# Patient Record
Sex: Female | Born: 1965 | Race: White | Hispanic: No | Smoking: Former smoker
Health system: Southern US, Community
[De-identification: ages and names within clinical notes are randomized; demographics above are authoritative.]

## PROBLEM LIST (undated history)

## (undated) DIAGNOSIS — E785 Hyperlipidemia, unspecified: Secondary | ICD-10-CM

## (undated) DIAGNOSIS — N63 Unspecified lump in unspecified breast: Secondary | ICD-10-CM

## (undated) DIAGNOSIS — I341 Nonrheumatic mitral (valve) prolapse: Secondary | ICD-10-CM

## (undated) DIAGNOSIS — F509 Eating disorder, unspecified: Secondary | ICD-10-CM

## (undated) DIAGNOSIS — I491 Atrial premature depolarization: Secondary | ICD-10-CM

## (undated) DIAGNOSIS — K219 Gastro-esophageal reflux disease without esophagitis: Secondary | ICD-10-CM

## (undated) DIAGNOSIS — M797 Fibromyalgia: Secondary | ICD-10-CM

## (undated) DIAGNOSIS — M1990 Unspecified osteoarthritis, unspecified site: Secondary | ICD-10-CM

## (undated) DIAGNOSIS — T7840XA Allergy, unspecified, initial encounter: Secondary | ICD-10-CM

## (undated) DIAGNOSIS — F4310 Post-traumatic stress disorder, unspecified: Secondary | ICD-10-CM

## (undated) DIAGNOSIS — J439 Emphysema, unspecified: Secondary | ICD-10-CM

## (undated) DIAGNOSIS — F32A Depression, unspecified: Secondary | ICD-10-CM

## (undated) DIAGNOSIS — N9489 Other specified conditions associated with female genital organs and menstrual cycle: Secondary | ICD-10-CM

## (undated) DIAGNOSIS — R011 Cardiac murmur, unspecified: Secondary | ICD-10-CM

## (undated) DIAGNOSIS — N83209 Unspecified ovarian cyst, unspecified side: Secondary | ICD-10-CM

## (undated) DIAGNOSIS — A64 Unspecified sexually transmitted disease: Secondary | ICD-10-CM

## (undated) DIAGNOSIS — N6019 Diffuse cystic mastopathy of unspecified breast: Secondary | ICD-10-CM

## (undated) DIAGNOSIS — R7611 Nonspecific reaction to tuberculin skin test without active tuberculosis: Secondary | ICD-10-CM

## (undated) DIAGNOSIS — N390 Urinary tract infection, site not specified: Secondary | ICD-10-CM

## (undated) DIAGNOSIS — R4189 Other symptoms and signs involving cognitive functions and awareness: Secondary | ICD-10-CM

## (undated) DIAGNOSIS — I493 Ventricular premature depolarization: Secondary | ICD-10-CM

## (undated) HISTORY — DX: Unspecified lump in unspecified breast: N63.0

## (undated) HISTORY — DX: Nonrheumatic mitral (valve) prolapse: I34.1

## (undated) HISTORY — PX: REPLANTATION THUMB: SUR1233

## (undated) HISTORY — DX: Other symptoms and signs involving cognitive functions and awareness: R41.89

## (undated) HISTORY — DX: Depression, unspecified: F32.A

## (undated) HISTORY — DX: Atrial premature depolarization: I49.1

## (undated) HISTORY — DX: Ventricular premature depolarization: I49.3

## (undated) HISTORY — DX: Gastro-esophageal reflux disease without esophagitis: K21.9

## (undated) HISTORY — DX: Urinary tract infection, site not specified: N39.0

## (undated) HISTORY — DX: Diffuse cystic mastopathy of unspecified breast: N60.19

## (undated) HISTORY — DX: Nonspecific reaction to tuberculin skin test without active tuberculosis: R76.11

## (undated) HISTORY — DX: Eating disorder, unspecified: F50.9

## (undated) HISTORY — DX: Unspecified osteoarthritis, unspecified site: M19.90

## (undated) HISTORY — DX: Hyperlipidemia, unspecified: E78.5

## (undated) HISTORY — PX: CERVICAL POLYPECTOMY: SHX88

## (undated) HISTORY — DX: Fibromyalgia: M79.7

## (undated) HISTORY — PX: SINUS LIFT WITH BONE GRAFT: SHX5306

## (undated) HISTORY — DX: Cardiac murmur, unspecified: R01.1

## (undated) HISTORY — DX: Unspecified ovarian cyst, unspecified side: N83.209

## (undated) HISTORY — DX: Post-traumatic stress disorder, unspecified: F43.10

## (undated) HISTORY — PX: TUBAL LIGATION: SHX77

## (undated) HISTORY — PX: MAXILLARY SINUS LIFT: SHX5206

## (undated) HISTORY — DX: Unspecified sexually transmitted disease: A64

## (undated) HISTORY — DX: Other specified conditions associated with female genital organs and menstrual cycle: N94.89

## (undated) HISTORY — DX: Allergy, unspecified, initial encounter: T78.40XA

## (undated) HISTORY — DX: Emphysema, unspecified: J43.9

---

## 2000-12-08 ENCOUNTER — Encounter: Payer: Self-pay | Admitting: Cardiology

## 2000-12-08 ENCOUNTER — Encounter: Admission: RE | Admit: 2000-12-08 | Discharge: 2000-12-08 | Payer: Self-pay | Admitting: Cardiology

## 2001-02-11 ENCOUNTER — Ambulatory Visit (HOSPITAL_COMMUNITY): Admission: RE | Admit: 2001-02-11 | Discharge: 2001-02-11 | Payer: Self-pay | Admitting: Cardiology

## 2001-02-11 ENCOUNTER — Encounter: Payer: Self-pay | Admitting: Cardiology

## 2003-11-25 ENCOUNTER — Encounter: Admission: RE | Admit: 2003-11-25 | Discharge: 2003-11-25 | Payer: Self-pay | Admitting: Family Medicine

## 2003-12-28 ENCOUNTER — Other Ambulatory Visit: Admission: RE | Admit: 2003-12-28 | Discharge: 2003-12-28 | Payer: Self-pay | Admitting: Family Medicine

## 2008-12-26 ENCOUNTER — Other Ambulatory Visit: Admission: RE | Admit: 2008-12-26 | Discharge: 2008-12-26 | Payer: Self-pay | Admitting: Obstetrics and Gynecology

## 2008-12-27 ENCOUNTER — Encounter: Admission: RE | Admit: 2008-12-27 | Discharge: 2008-12-27 | Payer: Self-pay | Admitting: Obstetrics and Gynecology

## 2009-03-14 ENCOUNTER — Encounter: Payer: Self-pay | Admitting: Cardiology

## 2009-06-30 ENCOUNTER — Emergency Department (HOSPITAL_COMMUNITY): Admission: EM | Admit: 2009-06-30 | Discharge: 2009-06-30 | Payer: Self-pay | Admitting: Emergency Medicine

## 2009-07-10 ENCOUNTER — Encounter: Admission: RE | Admit: 2009-07-10 | Discharge: 2009-07-10 | Payer: Self-pay | Admitting: Obstetrics and Gynecology

## 2009-10-04 ENCOUNTER — Encounter: Admission: RE | Admit: 2009-10-04 | Discharge: 2009-10-04 | Payer: Self-pay | Admitting: Obstetrics and Gynecology

## 2009-10-18 LAB — HM PAP SMEAR: HM Pap smear: NORMAL

## 2009-11-24 ENCOUNTER — Ambulatory Visit: Payer: Self-pay | Admitting: Family Medicine

## 2009-11-24 DIAGNOSIS — Z8679 Personal history of other diseases of the circulatory system: Secondary | ICD-10-CM | POA: Insufficient documentation

## 2009-11-24 DIAGNOSIS — R142 Eructation: Secondary | ICD-10-CM

## 2009-11-24 DIAGNOSIS — R002 Palpitations: Secondary | ICD-10-CM

## 2009-11-24 DIAGNOSIS — R5383 Other fatigue: Secondary | ICD-10-CM

## 2009-11-24 DIAGNOSIS — R141 Gas pain: Secondary | ICD-10-CM | POA: Insufficient documentation

## 2009-11-24 DIAGNOSIS — R5381 Other malaise: Secondary | ICD-10-CM

## 2009-11-24 DIAGNOSIS — J3089 Other allergic rhinitis: Secondary | ICD-10-CM | POA: Insufficient documentation

## 2009-11-24 DIAGNOSIS — M2550 Pain in unspecified joint: Secondary | ICD-10-CM | POA: Insufficient documentation

## 2009-11-24 DIAGNOSIS — F329 Major depressive disorder, single episode, unspecified: Secondary | ICD-10-CM

## 2009-11-24 DIAGNOSIS — J309 Allergic rhinitis, unspecified: Secondary | ICD-10-CM

## 2009-11-24 DIAGNOSIS — R143 Flatulence: Secondary | ICD-10-CM

## 2009-11-24 DIAGNOSIS — N83209 Unspecified ovarian cyst, unspecified side: Secondary | ICD-10-CM

## 2009-11-28 ENCOUNTER — Encounter (INDEPENDENT_AMBULATORY_CARE_PROVIDER_SITE_OTHER): Payer: Self-pay | Admitting: *Deleted

## 2009-11-29 ENCOUNTER — Encounter (INDEPENDENT_AMBULATORY_CARE_PROVIDER_SITE_OTHER): Payer: Self-pay | Admitting: *Deleted

## 2009-12-11 ENCOUNTER — Telehealth (INDEPENDENT_AMBULATORY_CARE_PROVIDER_SITE_OTHER): Payer: Self-pay | Admitting: *Deleted

## 2009-12-19 ENCOUNTER — Encounter (INDEPENDENT_AMBULATORY_CARE_PROVIDER_SITE_OTHER): Payer: Self-pay | Admitting: *Deleted

## 2010-06-19 NOTE — Miscellaneous (Signed)
  Clinical Lists Changes  Observations: Added new observation of PAST SURG HX: Tubal ligation Cervical Polyp removal (11/28/2009 11:24) Added new observation of PAST MED HX: Fibrocystic Breast Ovarian Cyst Allergic rhinitis Mitral Valve Prolapse Hx of Positive PPD UTI  (11/28/2009 11:24) Added new observation of MAMMO DUE: 01/2010 (11/28/2009 11:24) Added new observation of MAMMOGRAM: normal (10/11/2009 11:25)      Preventive Care Screening  Mammogram:    Date:  10/11/2009    Next Due:  01/2010    Results:  normal    Past Medical History:    Fibrocystic Breast    Ovarian Cyst    Allergic rhinitis    Mitral Valve Prolapse    Hx of Positive PPD    UTI  Past Surgical History:    Tubal ligation    Cervical Polyp removal

## 2010-06-19 NOTE — Letter (Signed)
Summary: [place] No Show Letter  [place] [facility] [place]  [address] [place], [place] [address]   Phone: [phone]  Fax: [phone]    12/19/2009 MRN: [id number]  [facility] [address] [place], [place]  [address]   Dear Ms. Stuckert,   Our records indicate that you missed your scheduled appointment with _________Dr.Tabori________ on ______8/1/11______.  Please contact this office to reschedule your appointment as soon as possible.  It is important that you keep your scheduled appointments with your physician, so we can provide you the best care possible.  Please be advised that there may be a charge for "no show" appointments.    Sincerely,   [place] [facility]
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
## 2010-06-19 NOTE — Progress Notes (Signed)
Summary: RESULTS OF LABS???  Phone Note Call from Patient Call back at [facility] Phone [phone]   Caller: Patient Summary of Call: PATIENT CALLED TO ASK ABOUT RESULTS OF HER LAB WORK----IT HAS BEEN A LONG TIME!!!! Initial call taken by: Jerolyn Shin,  December 11, 2009 12:28 PM  Follow-up for Phone Call        tried calling patient recording stating all circuits are busy will try back ..........Marland KitchenDoristine Devoid CMA  December 11, 2009 4:22 PM   found another # for patient [phone] left message on machine ..........Marland KitchenDoristine Devoid CMA  December 12, 2009 9:21 AM   Additional Follow-up for Phone Call Additional follow up Details #1::        Gave her friend Elijah Birk information on nomal lab work and then spoke with patient  and she is aware.  Additional Follow-up by: Harold Barban,  December 12, 2009 9:50 AM

## 2010-06-19 NOTE — Letter (Signed)
Summary: New Patient letter  [facility]  [address] [place], [place] [address]   Phone: [phone]  Fax: [phone]       11/29/2009 MRN: [id number]  [facility] [address] [place], [place]  [address]  Dear Katherine Cruz,  Welcome to the Gastroenterology Division at [facility].    You are scheduled to see Dr. Christella Hartigan on 01/16/2010 at 8:30AM on the 3rd floor at [facility], [address]  We ask that you try to arrive at our office 15 minutes prior to your appointment time to allow for check-in.  We would like you to complete the enclosed self-administered evaluation form prior to your visit and bring it with you on the day of your appointment.  We will review it with you.  Also, please bring a complete list of all your medications or, if you prefer, bring the medication bottles and we will list them.  Please bring your insurance card so that we may make a copy of it.  If your insurance requires a referral to see a specialist, please bring your referral form from your primary care physician.  Co-payments are due at the time of your visit and may be paid by cash, check or credit card.     Your office visit will consist of a consult with your physician (includes a physical exam), any laboratory testing he/she may order, scheduling of any necessary diagnostic testing (e.g. x-ray, ultrasound, CT-scan), and scheduling of a procedure (e.g. Endoscopy, Colonoscopy) if required.  Please allow enough time on your schedule to allow for any/all of these possibilities.    If you cannot keep your appointment, please call [phone] to cancel or reschedule prior to your appointment date.  This allows [place] the opportunity to schedule an appointment for another patient in need of care.  If you do not cancel or reschedule by 5 p.m. the business day prior to your appointment date, you will be charged a $50.00 late cancellation/no-show fee.    Thank you for choosing  [facility] for your medical needs.  We appreciate the opportunity to care for you.  Please visit [place] at our website  to learn more about our practice.                     Sincerely,                                                             The Gastroenterology Division

## 2010-08-19 LAB — HM MAMMOGRAPHY: HM Mammogram: NORMAL

## 2010-08-22 ENCOUNTER — Encounter: Payer: Self-pay | Admitting: Family Medicine

## 2010-08-23 ENCOUNTER — Other Ambulatory Visit: Payer: Self-pay | Admitting: Family Medicine

## 2010-08-23 ENCOUNTER — Ambulatory Visit (INDEPENDENT_AMBULATORY_CARE_PROVIDER_SITE_OTHER): Payer: Self-pay | Admitting: Family Medicine

## 2010-08-23 VITALS — BP 110/62 | HR 76 | Temp 98.3°F | Wt 147.0 lb

## 2010-08-23 DIAGNOSIS — N63 Unspecified lump in unspecified breast: Secondary | ICD-10-CM

## 2010-08-23 HISTORY — DX: Unspecified lump in unspecified breast: N63.0

## 2010-08-23 MED ORDER — SULFAMETHOXAZOLE-TRIMETHOPRIM 800-160 MG PO TABS: 1.0000 | ORAL_TABLET | Freq: Two times a day (BID) | ORAL | Status: AC

## 2010-08-23 NOTE — Patient Instructions (Signed)
We will call you with your mammogram appt Start the Bactrim twice daily as directed for possible breast infection Apply hot compresses for pain relief Call and see if you apply for the [facility] discount program OR call [facility] at [phone] so that your additional issues can be addressed Call with any questions or concerns Hang in there!

## 2010-08-23 NOTE — Progress Notes (Signed)
  Subjective:    Patient ID: Katherine Cruz, female    DOB: January 31, 1966, 45 y.o.   MRN: [id number]  HPI L breast pain- found lump in breast 10 days ago, 2 days ago it was so painful she was unable to hug.  No drainage, no redness.  + warmth.  Pain is located around the areola and nipple.  Hx of fibrocystic breasts.  Last mammogram was 09/2009.   Review of Systems For ROS see HPI     Objective:   Physical Exam  Pulmonary/Chest: Right breast exhibits no inverted nipple, no mass, no nipple discharge, no skin change and no tenderness. Left breast exhibits mass and tenderness. Left breast exhibits no inverted nipple and no nipple discharge.            Assessment & Plan:

## 2010-08-27 ENCOUNTER — Ambulatory Visit
Admission: RE | Admit: 2010-08-27 | Discharge: 2010-08-27 | Disposition: A | Discharge status: Home / Self Care | Payer: Self-pay | Source: Ambulatory Visit | Attending: Family Medicine | Admitting: Family Medicine

## 2010-08-27 ENCOUNTER — Other Ambulatory Visit: Payer: Self-pay | Admitting: Family Medicine

## 2010-08-27 DIAGNOSIS — N63 Unspecified lump in unspecified breast: Secondary | ICD-10-CM

## 2010-09-04 NOTE — Assessment & Plan Note (Signed)
Get diagnostic mammo to r/o cancer.  tx as mastitis/abscess given erythema and warmth.  Pt expressed understanding and is in agreement w/ plan.

## 2010-09-21 ENCOUNTER — Telehealth: Payer: Self-pay | Admitting: *Deleted

## 2010-09-21 ENCOUNTER — Emergency Department (HOSPITAL_COMMUNITY)
Admission: EM | Admit: 2010-09-21 | Discharge: 2010-09-21 | Disposition: A | Discharge status: Home / Self Care | Payer: Self-pay | Attending: Emergency Medicine | Admitting: Emergency Medicine

## 2010-09-21 DIAGNOSIS — R0602 Shortness of breath: Secondary | ICD-10-CM | POA: Insufficient documentation

## 2010-09-21 DIAGNOSIS — I059 Rheumatic mitral valve disease, unspecified: Secondary | ICD-10-CM | POA: Insufficient documentation

## 2010-09-21 DIAGNOSIS — G4730 Sleep apnea, unspecified: Secondary | ICD-10-CM | POA: Insufficient documentation

## 2010-09-21 LAB — POCT I-STAT, CHEM 8
BUN: 8 mg/dL (ref 6–23)
Calcium, Ion: 1.12 mmol/L (ref 1.12–1.32)
Creatinine, Ser: 0.7 mg/dL (ref 0.4–1.2)
TCO2: 25 mmol/L (ref 0–100)

## 2010-09-21 NOTE — Telephone Encounter (Signed)
[facility] Triage Call Report Triage Record Num: [id number] Operator: Alphonsa Overall Patient Name: Katherine Cruz Call Date & Time: 09/21/2010 1:20:56AM Patient Phone: [phone] PCP: Neena Rhymes ([facility]) Patient Gender: Female PCP Fax : Patient DOB: 05-22-1965 Practice Name: [facility] Reason for Call: LMP 08/22/10. Denies preg or BF. Maizy calling about apnea. Onset 09/21/10 0030. Pt stopped breathing during sleep and she jumped up and breathing heavy. Lightheaded at 0124. 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 Pt has stopped smoking and drinking caffiene abruptly. Pt with SOB, heart palpatations during day. Therasa aware needs ED care now. Protocol(s) Used: Breathing Problems Recommended Outcome per Protocol: See ED Immediately Reason for Outcome: Sudden onset of shortness of breath, difficulty breathing, chest pain OR cough with blood tinged sputum. Care Advice: ~ Another adult should drive. Call EMS 911 if loss of consciousness, struggling to breathe, experiences new confusion or extreme drowsiness, change in skin color, or has chest pain or discomfort lasting 5 minutes or more. ~ ~ IMMEDIATE ACTION Write down provider's name. List or place the following in a bag for transport with the patient: current prescription and/or nonprescription medications; alternative treatments, therapies and medications; and street drugs. ~ ~ Place person in a position of comfort and loosen tight clothing.

## 2010-09-21 NOTE — Telephone Encounter (Signed)
Pt evaluated in ED and discharge home this AM.

## 2010-09-24 ENCOUNTER — Telehealth: Payer: Self-pay | Admitting: Family Medicine

## 2010-09-24 NOTE — Telephone Encounter (Signed)
Patient has lab appt for Tues 5/8 at 9am---doesn't have insurance right now--can she put lab off for 2-3 weeks unless [employer] program kicks in--worried since she was in hospital that she shouldn't put it off, but would like to if it wont hurt her health

## 2010-09-24 NOTE — Telephone Encounter (Signed)
Please advise 

## 2010-09-24 NOTE — Telephone Encounter (Signed)
Called [phone] per patient's request---gave message to female that answered phone that it was OK to move appt out 2-3 weeks---he said her phone was not working and that's why she left his phone number instead

## 2010-09-24 NOTE — Telephone Encounter (Signed)
Ok to hold off for 2-3 weeks

## 2010-09-25 ENCOUNTER — Other Ambulatory Visit: Payer: Self-pay

## 2010-09-28 NOTE — Telephone Encounter (Signed)
noted 

## 2010-10-10 ENCOUNTER — Ambulatory Visit (INDEPENDENT_AMBULATORY_CARE_PROVIDER_SITE_OTHER): Payer: Self-pay | Admitting: Family Medicine

## 2010-10-10 ENCOUNTER — Encounter: Payer: Self-pay | Admitting: Family Medicine

## 2010-10-10 DIAGNOSIS — R6889 Other general symptoms and signs: Secondary | ICD-10-CM

## 2010-10-10 DIAGNOSIS — G4730 Sleep apnea, unspecified: Secondary | ICD-10-CM

## 2010-10-10 DIAGNOSIS — R7989 Other specified abnormal findings of blood chemistry: Secondary | ICD-10-CM

## 2010-10-10 DIAGNOSIS — J309 Allergic rhinitis, unspecified: Secondary | ICD-10-CM

## 2010-10-10 NOTE — Patient Instructions (Signed)
We will notify you of your lab results Someone will call you with your pulmonary appt to evaluate for sleep apnea Start OTC Claritin or Zyrtec daily for your allergies and post nasal drip Call with any questions or concerns Hang in there!

## 2010-10-10 NOTE — Progress Notes (Signed)
  Subjective:    Patient ID: Katherine Cruz, female    DOB: 1966-04-10, 45 y.o.   MRN: [id number]  HPI ? Sleep apnea- went to hospital 5/4 for SOB overnight.  (records reviewed)  ER doc thought sxs were consistent w/ sleep apnea and recommended she f/u w/ PCP to get referral.  Abnormal TSH- pt's TSH was mildly elevated in ER.  Has not had T3/T4 evaluated.  Reports 'exhaustion' and multiple other sxs- weight gain, GI upset, myalgias and after doing internet research feels these are all thyroid related.  Sore throat- chronic problem for pt 'months and months', reports white patches consistently.  Felt sxs improved when on abx for breast cyst.  Stopped smoking.  Fears cancer.  Hx of PND and seasonal allergies.  Not currently taking any allergy meds.   Review of Systems For ROS see HPI     Objective:   Physical Exam  Constitutional: She appears well-developed and well-nourished. No distress.       anxious  HENT:  Head: Normocephalic and atraumatic.  Right Ear: Tympanic membrane, external ear and ear canal normal.  Left Ear: Tympanic membrane, external ear and ear canal normal.  Nose: Mucosal edema and rhinorrhea present. Right sinus exhibits no maxillary sinus tenderness and no frontal sinus tenderness. Left sinus exhibits no maxillary sinus tenderness and no frontal sinus tenderness.  Mouth/Throat: Uvula is midline, oropharynx is clear and moist and mucous membranes are normal. No oral lesions. No uvula swelling. No posterior oropharyngeal edema or tonsillar abscesses.       + PND  Neck: Normal range of motion. Neck supple. Thyromegaly present.       Mildly enlarged thyroid w/out nodules or assymmetry  Cardiovascular: Normal rate, regular rhythm, normal heart sounds and intact distal pulses.   No murmur heard. Pulmonary/Chest: Effort normal and breath sounds normal. No respiratory distress. She has no wheezes.  Abdominal: Soft. Bowel sounds are normal. She exhibits no distension. There is no  tenderness.  Musculoskeletal: She exhibits no edema.          Assessment & Plan:

## 2010-10-10 NOTE — Assessment & Plan Note (Signed)
Pt's sore throat and white patches are most likely due to untreated PND.  Nothing on PE concerning for infxn or cancer.  Start OTC antihistamines, ibuprofen prn for pain.  Reviewed supportive care and red flags that should prompt return.  Pt expressed understanding and is in agreement w/ plan.

## 2010-10-10 NOTE — Assessment & Plan Note (Signed)
Pt's body habitus not consistent w/ sleep apnea but it is possible.  will refer to pulm at [facility] recommendation.

## 2010-10-10 NOTE — Assessment & Plan Note (Signed)
Repeat TSH and check T3/T4.  Start meds prn.  Pt is very hopeful that this will explain the majority of her ongoing sxs.

## 2010-10-11 ENCOUNTER — Encounter: Payer: Self-pay | Admitting: *Deleted

## 2010-10-11 ENCOUNTER — Telehealth: Payer: Self-pay | Admitting: *Deleted

## 2010-10-11 NOTE — Telephone Encounter (Signed)
Tried to call Pt VM not set up will try again later

## 2010-10-11 NOTE — Telephone Encounter (Addendum)
Tried to call Patient no answer and VM not set up will try again later.  Message copied by Candie Echevaria on Thu Oct 11, 2010  8:52 AM ------      Message from: Neena Rhymes      Created: Wed Oct 10, 2010  4:53 PM       Thyroid is normal.  T3 is at low end of normal.  Could start levothyroxine daily and see if sxs improve.  Will repeat labs in 3 months.

## 2010-10-17 ENCOUNTER — Encounter: Payer: Self-pay | Admitting: *Deleted

## 2010-10-17 ENCOUNTER — Telehealth: Payer: Self-pay | Admitting: Family Medicine

## 2010-10-17 MED ORDER — LEVOTHYROXINE SODIUM 50 MCG PO CAPS: 50.0000 ug | ORAL_CAPSULE | Freq: Every day | ORAL | Status: DC

## 2010-10-17 NOTE — Telephone Encounter (Signed)
Discuss with patient, Rx sent to pharmacy. 

## 2010-10-17 NOTE — Telephone Encounter (Signed)
Tried to call Pt still no answer and VM not set up. Letter mailed to Pt advising Pt to contact office.Marland Kitchen

## 2010-10-17 NOTE — Telephone Encounter (Signed)
Error---ignore=duplicate

## 2010-10-17 NOTE — Telephone Encounter (Signed)
Addended by: Candie Echevaria L on: 10/17/2010 04:23 PM   Modules accepted: Orders

## 2010-10-18 ENCOUNTER — Telehealth: Payer: Self-pay | Admitting: Endocrinology

## 2010-10-18 NOTE — Telephone Encounter (Signed)
Katherine Cruz called asking to see [name] as a second opinion regarding starting thyroid medicine which [name] has offered as an option.    Patient wants to see [name] to discuss alternative options.  She has questions about taking iodine instead of hormones. I have called her back to tell her [name] supports [name] suggestion about starting the Levothyroxine.  She still would like to have an appt. With [name].

## 2010-10-18 NOTE — Telephone Encounter (Signed)
ok 

## 2010-10-19 NOTE — Telephone Encounter (Signed)
Pt is scheduled with [name] on June 25.  She has also made an appt to transfer to Dr. Yetta Barre on July 13.

## 2010-10-24 ENCOUNTER — Encounter: Payer: Self-pay | Admitting: Pulmonary Disease

## 2010-10-24 ENCOUNTER — Ambulatory Visit (INDEPENDENT_AMBULATORY_CARE_PROVIDER_SITE_OTHER): Payer: Self-pay | Admitting: Pulmonary Disease

## 2010-10-24 VITALS — BP 124/72 | HR 84 | Temp 98.2°F | Ht 64.5 in | Wt 144.8 lb

## 2010-10-24 DIAGNOSIS — G4733 Obstructive sleep apnea (adult) (pediatric): Secondary | ICD-10-CM

## 2010-10-24 DIAGNOSIS — G478 Other sleep disorders: Secondary | ICD-10-CM | POA: Insufficient documentation

## 2010-10-24 NOTE — Progress Notes (Signed)
  Subjective:    Patient ID: Katherine Cruz, female    DOB: Aug 11, 1965, 45 y.o.   MRN: [id number]  HPI The pt is a 45y/o female who I have been asked to see for possible osa.  She has been having issues with gasping arousals during sleep for about 3 yrs, but now the problem is becoming more consistent.  Currently occurs about 2 times a week, and up to 5 times each night when does occur.  She awakens from sleep with a feeling she "can't breathe", but denies air restriction in her throat or chest.  She has no h/o asthma and denies reflux symptoms, but does have a h/o MVP.  She has been noted to have mild/mod snoring, but bed partner has not witnessed an abnormal breathing pattern during sleep.  She is not rested upon arising, and does note sleep pressure issues during the day with inactivity.  She is not satisfied with her alertness during the day.  She has no dozing issues in the evening watching tv, but does have sleepiness.  She denies any sleepiness with driving.  Of note, her weight is up 10-15 pounds over the last 2 yrs, and her epworth today is 6.   Sleep Questionnaire: What time do you typically go to bed?( Between what hours) 11 to midnight How long does it take you to fall asleep? 30 mins How many times during the night do you wake up? 5 What time do you get out of bed to start your day? 0800 Do you drive or operate heavy machinery in your occupation? No How much has your weight changed (up or down) over the past two years? (In pounds) 15 lb (6.804 kg) Have you ever had a sleep study before? Do you currently use CPAP? No Do you wear oxygen at any time? No    Review of Systems  Constitutional: Positive for unexpected weight change. Negative for fever.  HENT: Positive for ear pain, congestion, sore throat, sneezing and dental problem. Negative for nosebleeds, rhinorrhea, trouble swallowing, postnasal drip and sinus pressure.   Eyes: Negative for redness and itching.  Respiratory: Positive for  shortness of breath. Negative for cough, chest tightness and wheezing.   Cardiovascular: Positive for chest pain and palpitations. Negative for leg swelling.  Gastrointestinal: Positive for abdominal pain. Negative for nausea and vomiting.  Genitourinary: Negative for dysuria.  Musculoskeletal: Positive for joint swelling.  Skin: Negative for rash.  Neurological: Negative for headaches.  Hematological: Does not bruise/bleed easily.  Psychiatric/Behavioral: Negative for dysphoric mood. The patient is nervous/anxious.        Objective:   Physical Exam Constitutional:  Well developed, no acute distress  HENT:  Nares patent without discharge  Oropharynx without exudate, palate and uvula are normal  Eyes:  Perrla, eomi, no scleral icterus  Neck:  No JVD, appears to have mildly enlarged thyroid.  Cardiovascular:  Normal rate, regular rhythm, no rubs or gallops.  No murmurs        Intact distal pulses  Pulmonary :  Normal breath sounds, no stridor or respiratory distress   No rales, rhonchi, or wheezing  Abdominal:  Soft, nondistended, bowel sounds present.  No tenderness noted.   Musculoskeletal:  No lower extremity edema noted.  Lymph Nodes:  No cervical lymphadenopathy noted  Skin:  No cyanosis noted  Neurologic:  Alert, appropriate, moves all 4 extremities without obvious deficit.         Assessment & Plan:

## 2010-10-24 NOTE — Assessment & Plan Note (Signed)
There are many features of her history that are suggestive of sleep apnea, but she does not have the body habitus or upper airway anatomy that would put her at risk for osa.  I think she does need a sleep study to put the issue to rest, but would need to consider the other possibilities for her symptoms if the study is unremarkable (although her symptoms are intermittent).  This can include nocturnal asthma, GERD, panic attacks, and arrhythmias.

## 2010-10-24 NOTE — Patient Instructions (Signed)
Will do home sleep testing to evaluate for obstructive and central sleep apnea.  Will call you with results.

## 2010-10-26 ENCOUNTER — Encounter: Payer: Self-pay | Admitting: *Deleted

## 2010-10-26 NOTE — Telephone Encounter (Signed)
A user error has taken place: encounter opened in error, closed for administrative reasons.

## 2010-11-05 ENCOUNTER — Telehealth: Payer: Self-pay | Admitting: Pulmonary Disease

## 2010-11-05 NOTE — Telephone Encounter (Signed)
PCC, pls help with this thanks!

## 2010-11-05 NOTE — Telephone Encounter (Signed)
Called and spoke with pt and advised pt that there are still 3 people ahead of her on the wait list. Advised pt that it should be sometime this week when I can arrange this study for her. Pt stated that this was fine that she was just checking on status. Pt advised that we should be able to arrange this week and that I would contact her when device is available.

## 2010-11-12 ENCOUNTER — Ambulatory Visit (INDEPENDENT_AMBULATORY_CARE_PROVIDER_SITE_OTHER): Payer: Self-pay | Admitting: Endocrinology

## 2010-11-12 ENCOUNTER — Encounter: Payer: Self-pay | Admitting: Endocrinology

## 2010-11-12 VITALS — BP 110/72 | HR 73 | Temp 98.4°F | Ht 64.0 in | Wt 143.0 lb

## 2010-11-12 DIAGNOSIS — R002 Palpitations: Secondary | ICD-10-CM

## 2010-11-12 DIAGNOSIS — R6889 Other general symptoms and signs: Secondary | ICD-10-CM

## 2010-11-12 DIAGNOSIS — R7989 Other specified abnormal findings of blood chemistry: Secondary | ICD-10-CM

## 2010-11-12 NOTE — Patient Instructions (Addendum)
A 24-hr urine test is being ordered for you today.  please call [phone] to hear your test results.  You will be prompted to enter the 9-digit "MRN" number that appears at the top left of this page, followed by #.  Then you will hear the message. Please see dr Yetta Barre as scheduled.   No treatment is needed for the thyroid now, but you should have the thyroid blood test checked each year.  Your thyroid will probably go low, as you get older.
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
## 2010-11-15 ENCOUNTER — Other Ambulatory Visit: Payer: Self-pay

## 2010-11-15 DIAGNOSIS — R002 Palpitations: Secondary | ICD-10-CM

## 2010-11-19 LAB — CATECHOLAMINES, FRACTIONATED, URINE, 24 HOUR
Epinephrine, 24 hr Urine: 7 mcg/24 h (ref 2–24)
Norepinephrine, 24 hr Ur: 25 mcg/24 h (ref 15–100)
Total Volume - CF 24Hr U: 1000 mL

## 2010-11-26 ENCOUNTER — Ambulatory Visit (INDEPENDENT_AMBULATORY_CARE_PROVIDER_SITE_OTHER): Payer: Self-pay | Admitting: Pulmonary Disease

## 2010-11-26 DIAGNOSIS — G478 Other sleep disorders: Secondary | ICD-10-CM

## 2010-11-26 DIAGNOSIS — G4733 Obstructive sleep apnea (adult) (pediatric): Secondary | ICD-10-CM

## 2010-11-26 NOTE — Assessment & Plan Note (Signed)
The pt does not have sleep disordered breathing by her most recent sleep study, but her symptoms have been intermittent in the past.  I wonder if she may be having reflux which results in her gasping arousals?  Will discuss results with pt, and consider trying a course of PPI to see if helps.

## 2010-11-26 NOTE — Progress Notes (Signed)
The pt underwent home sleep testing with a type 3 monitoring device.  Airflow, effort, oxygen saturation, and heart rate were all monitored during the study.  Raw data and tracing were reviewed with the following findings:  1) flow evaluation period of 4hrs and  2) one apnea and no hypopneas were noted during the night, but snoring events were present.  AHI was essentially zero. 3) low oxygen saturation of 82%.  The pt spent only less than or equal to 88% during the night.

## 2010-11-27 ENCOUNTER — Telehealth: Payer: Self-pay | Admitting: Pulmonary Disease

## 2010-11-27 NOTE — Telephone Encounter (Signed)
Discussed sleep study with pt.  She had AHI 0. Suspect her symptoms are from reflux, MVP, doubt nocturnal asthma. Asked her to try prilosec 20mg  2 at [facility] for next few weeks as a trial.

## 2010-11-29 ENCOUNTER — Encounter: Payer: Self-pay | Admitting: Internal Medicine

## 2010-11-30 ENCOUNTER — Ambulatory Visit (INDEPENDENT_AMBULATORY_CARE_PROVIDER_SITE_OTHER): Payer: Self-pay | Admitting: Internal Medicine

## 2010-11-30 ENCOUNTER — Encounter: Payer: Self-pay | Admitting: Internal Medicine

## 2010-11-30 ENCOUNTER — Other Ambulatory Visit (INDEPENDENT_AMBULATORY_CARE_PROVIDER_SITE_OTHER): Payer: Self-pay

## 2010-11-30 DIAGNOSIS — R141 Gas pain: Secondary | ICD-10-CM

## 2010-11-30 DIAGNOSIS — R7989 Other specified abnormal findings of blood chemistry: Secondary | ICD-10-CM

## 2010-11-30 DIAGNOSIS — R6889 Other general symptoms and signs: Secondary | ICD-10-CM

## 2010-11-30 DIAGNOSIS — M2550 Pain in unspecified joint: Secondary | ICD-10-CM

## 2010-11-30 DIAGNOSIS — G478 Other sleep disorders: Secondary | ICD-10-CM

## 2010-11-30 DIAGNOSIS — R143 Flatulence: Secondary | ICD-10-CM

## 2010-11-30 DIAGNOSIS — R002 Palpitations: Secondary | ICD-10-CM

## 2010-11-30 DIAGNOSIS — IMO0001 Reserved for inherently not codable concepts without codable children: Secondary | ICD-10-CM | POA: Insufficient documentation

## 2010-11-30 DIAGNOSIS — R5381 Other malaise: Secondary | ICD-10-CM

## 2010-11-30 DIAGNOSIS — R5383 Other fatigue: Secondary | ICD-10-CM

## 2010-11-30 LAB — COMPREHENSIVE METABOLIC PANEL
ALT: 20 U/L (ref 0–35)
AST: 18 U/L (ref 0–37)
Albumin: 4.4 g/dL (ref 3.5–5.2)
Alkaline Phosphatase: 44 U/L (ref 39–117)
Glucose, Bld: 97 mg/dL (ref 70–99)
Potassium: 3.9 mEq/L (ref 3.5–5.1)
Sodium: 137 mEq/L (ref 135–145)
Total Protein: 7 g/dL (ref 6.0–8.3)

## 2010-11-30 LAB — SEDIMENTATION RATE: Sed Rate: 8 mm/h (ref 0–22)

## 2010-11-30 LAB — HIGH SENSITIVITY CRP: CRP, High Sensitivity: 1.77 mg/L (ref 0.000–5.000)

## 2010-11-30 LAB — RHEUMATOID FACTOR: Rheumatoid fact SerPl-aCnc: <10 [IU]/mL

## 2010-11-30 LAB — TSH: TSH: 1.63 u[IU]/mL (ref 0.35–5.50)

## 2010-11-30 MED ORDER — AMITRIPTYLINE HCL 10 MG PO TABS
10.0000 mg | ORAL_TABLET | Freq: Every day | ORAL | Status: DC
Start: 2010-11-30 — End: 2011-01-09

## 2010-11-30 NOTE — Assessment & Plan Note (Signed)
I agree with her that she has multiple s/s of FMG, I think the elavil will help, I will see how she responds to this and advise further

## 2010-11-30 NOTE — Assessment & Plan Note (Signed)
I ordered tests to screen for connective tissue disease

## 2010-11-30 NOTE — Assessment & Plan Note (Signed)
She tells me that this has resolved

## 2010-11-30 NOTE — Assessment & Plan Note (Signed)
I think this is the root of her symptoms so I have asked her to start taking a low dose of elavil at bedtime

## 2010-11-30 NOTE — Assessment & Plan Note (Signed)
As above.
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
## 2010-11-30 NOTE — Patient Instructions (Signed)

## 2010-11-30 NOTE — Assessment & Plan Note (Signed)
I will recheck her TSH today 

## 2010-12-03 ENCOUNTER — Encounter: Payer: Self-pay | Admitting: Pulmonary Disease

## 2010-12-04 ENCOUNTER — Ambulatory Visit: Payer: Self-pay | Attending: Otolaryngology | Admitting: Audiology

## 2010-12-04 DIAGNOSIS — H908 Mixed conductive and sensorineural hearing loss, unspecified: Secondary | ICD-10-CM | POA: Insufficient documentation

## 2010-12-21 ENCOUNTER — Telehealth: Payer: Self-pay | Admitting: *Deleted

## 2010-12-21 NOTE — Telephone Encounter (Signed)
normal

## 2010-12-21 NOTE — Telephone Encounter (Signed)
Patient requesting results of last labs.

## 2010-12-21 NOTE — Telephone Encounter (Signed)
Patient informed. 

## 2010-12-31 ENCOUNTER — Ambulatory Visit: Payer: Self-pay | Admitting: Internal Medicine

## 2010-12-31 DIAGNOSIS — Z0289 Encounter for other administrative examinations: Secondary | ICD-10-CM

## 2011-01-09 ENCOUNTER — Encounter: Payer: Self-pay | Admitting: Internal Medicine

## 2011-01-09 ENCOUNTER — Ambulatory Visit (INDEPENDENT_AMBULATORY_CARE_PROVIDER_SITE_OTHER): Payer: Self-pay | Admitting: Internal Medicine

## 2011-01-09 VITALS — BP 96/64 | HR 61 | Temp 98.0°F | Resp 16 | Wt 137.0 lb

## 2011-01-09 DIAGNOSIS — IMO0001 Reserved for inherently not codable concepts without codable children: Secondary | ICD-10-CM

## 2011-01-09 DIAGNOSIS — N83209 Unspecified ovarian cyst, unspecified side: Secondary | ICD-10-CM

## 2011-01-09 NOTE — Progress Notes (Signed)
Addendum - U/s orders needed correction, DONE

## 2011-01-09 NOTE — Assessment & Plan Note (Signed)
Repeat U/S has been ordered

## 2011-01-09 NOTE — Patient Instructions (Signed)

## 2011-01-09 NOTE — Progress Notes (Signed)
Addended by: Vernie Murders on: 01/09/2011 10:51 AM   Modules accepted: Orders

## 2011-01-09 NOTE — Assessment & Plan Note (Signed)
Pt ed material was given to her

## 2011-01-09 NOTE — Progress Notes (Signed)
Subjective:    Patient ID: Katherine Cruz, female    DOB: [birth year], 45 y.o.   MRN: [id number]  HPI She returns for f/up and she tells me that she did not start taking amitriptyline because [facility] would not pay for it but she tells me that her sleep has been "perfect" though she still has some intermittent diffuse muscle aches. 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 Her main complaint today is the left ovarian cyst that continues to bother her. She was seeing a GYN at [facility] but they will not see her anymore and she believes she needs a f/up U/S.    Review of Systems  Constitutional: Negative for fever, chills, diaphoresis, activity change, appetite change, fatigue and unexpected weight change.  HENT: Negative for sore throat, facial swelling, trouble swallowing, neck pain, neck stiffness and voice change.   Eyes: Negative.  Negative for photophobia, redness and visual disturbance.  Respiratory: Negative for apnea, cough, choking, chest tightness, shortness of breath, wheezing and stridor.   Cardiovascular: Negative for chest pain, palpitations and leg swelling.  Gastrointestinal: Negative for nausea, vomiting, abdominal pain, diarrhea, constipation and anal bleeding.  Genitourinary: Positive for pelvic pain (left). Negative for dysuria, urgency, frequency, hematuria, flank pain, decreased urine volume, vaginal bleeding, vaginal discharge, enuresis, difficulty urinating, genital sores, vaginal pain and dyspareunia.  Musculoskeletal: Negative for myalgias, back pain, joint swelling, arthralgias and gait problem.  Skin: Negative for color change, pallor, rash and wound.  Neurological: Negative for dizziness, tremors, seizures, syncope, facial asymmetry, speech difficulty, weakness, light-headedness, numbness and headaches.  Hematological: Negative for adenopathy. Does not bruise/bleed easily.  Psychiatric/Behavioral: Negative for suicidal ideas, hallucinations,  behavioral problems, confusion, sleep disturbance, self-injury, dysphoric mood, decreased concentration and agitation. The patient is not nervous/anxious and is not hyperactive.        Objective:   Physical Exam  Vitals reviewed. Constitutional: She is oriented to person, place, and time. She appears well-developed and well-nourished. No distress.  HENT:  Head: Normocephalic and atraumatic.  Right Ear: External ear normal.  Left Ear: External ear normal.  Nose: Nose normal.  Mouth/Throat: Oropharynx is clear and moist. No oropharyngeal exudate.  Eyes: Conjunctivae are normal. Pupils are equal, round, and reactive to light. Right eye exhibits no discharge. Left eye exhibits no discharge. No scleral icterus.  Neck: Normal range of motion. Neck supple. No JVD present. No tracheal deviation present. No thyromegaly present.  Cardiovascular: Normal rate, regular rhythm, normal heart sounds and intact distal pulses.  Exam reveals no gallop and no friction rub.   No murmur heard. Pulmonary/Chest: Effort normal and breath sounds normal. No stridor. No respiratory distress. She has no wheezes. She has no rales. She exhibits no tenderness.  Abdominal: Soft. Bowel sounds are normal. She exhibits no distension and no mass. There is no tenderness. There is no rebound and no guarding.  Musculoskeletal: Normal range of motion. She exhibits no edema and no tenderness.  Lymphadenopathy:    She has no cervical adenopathy.  Neurological: She is alert and oriented to person, place, and time. She has normal reflexes. She displays normal reflexes. No cranial nerve deficit. She exhibits normal muscle tone. Coordination normal.  Skin: Skin is warm and dry. No rash noted. She is not diaphoretic. No erythema. No pallor.  Psychiatric: She has a normal mood and affect. Her behavior is normal. Judgment and thought content normal.     Lab Results  Component Value Date  HGB 14.6 09/21/2010   HCT 43.0 09/21/2010   ALT  20 11/30/2010   AST 18 11/30/2010   NA 137 11/30/2010   K 3.9 11/30/2010   CL 103 11/30/2010   CREATININE 0.7 11/30/2010   BUN 20 11/30/2010   CO2 27 11/30/2010   TSH 1.63 11/30/2010      Assessment & Plan:

## 2011-01-16 NOTE — Progress Notes (Signed)
Addended by: Vernie Murders on: 01/16/2011 03:42 PM   Modules accepted: Orders

## 2011-01-18 ENCOUNTER — Ambulatory Visit (HOSPITAL_COMMUNITY)
Admission: RE | Admit: 2011-01-18 | Discharge: 2011-01-18 | Disposition: A | Discharge status: Home / Self Care | Payer: Self-pay | Source: Ambulatory Visit | Attending: Internal Medicine | Admitting: Internal Medicine

## 2011-01-18 DIAGNOSIS — N83209 Unspecified ovarian cyst, unspecified side: Secondary | ICD-10-CM | POA: Insufficient documentation

## 2011-01-18 DIAGNOSIS — N72 Inflammatory disease of cervix uteri: Secondary | ICD-10-CM | POA: Insufficient documentation

## 2011-01-28 ENCOUNTER — Telehealth: Payer: Self-pay | Admitting: *Deleted

## 2011-01-28 NOTE — Telephone Encounter (Signed)
Any advisement for her discomfort?

## 2011-01-28 NOTE — Telephone Encounter (Signed)
Patient informed. 

## 2011-01-28 NOTE — Telephone Encounter (Signed)
Aleve 1-2 BID

## 2011-01-28 NOTE — Telephone Encounter (Signed)
Ovarian cyst is complex but it appears to be benign

## 2011-01-28 NOTE — Telephone Encounter (Signed)
Patient requesting results

## 2011-03-27 ENCOUNTER — Encounter: Payer: Self-pay | Admitting: Endocrinology

## 2011-03-27 ENCOUNTER — Ambulatory Visit (INDEPENDENT_AMBULATORY_CARE_PROVIDER_SITE_OTHER): Payer: Self-pay | Admitting: Endocrinology

## 2011-03-27 VITALS — BP 108/70 | HR 72 | Temp 98.5°F | Ht 64.0 in | Wt 138.5 lb

## 2011-03-27 DIAGNOSIS — J069 Acute upper respiratory infection, unspecified: Secondary | ICD-10-CM

## 2011-03-27 MED ORDER — DOXYCYCLINE HYCLATE 100 MG PO TABS: 100.0000 mg | ORAL_TABLET | Freq: Two times a day (BID) | ORAL | Status: AC
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
## 2011-03-27 NOTE — Patient Instructions (Addendum)
i have sent a prescription to your pharmacy, for an antibiotic Saline solution will help your symptoms. I hope you feel better soon.  If you don't feel better by next week, please call your doctor.  You should try the nicotine patches or gum.  Dejar de fumar Este folleto explica la mejor manera de dejar de fumar, as [employer] nuevos tratamientos que podrn [place]. Le proporciona informacin acerca de los nuevos medicamentos que pueden doblar o Media planner las posibilidades de que usted abandone el hbito de fumar y de que lo haga para siempre. Tambin le indica el modo de evitar las recadas y responder a Environmental manager preocupaciones que usted pueda tener, incluyendo el aumento de [place].   LA NICOTINA: UNA ADICCIN PODEROSA   Si usted ha tratado [place] vez dejar de fumar, ya sabe lo difcil que resulta. Es una tarea ardua porque la nicotina es una droga [place]. Para algunas personas, puede ser tan adictiva como la herona o la cocana. Abandonar el hbito es muy difcil. Generalmente, se hacen 2  3 intentos, o ms, antes de poder dejarlo definitivamente. Cada vez que usted haga un intento, aprender qu cosas lo ayudan y que cosas lo perjudican. Dejar de fumar es una tarea difcil y conlleva un gran esfuerzo, pero usted Buyer, retail. ABANDONAR EL HBITO DE FUMAR ES UNA DE LAS COSAS MS IMPORTANTES QUE HABR HECHO EN SU VIDA:  Tendr una vida ms larga y de mejor calidad.     El impacto en el organismo se siente casi inmediatamente.     Dentro de los 20 minutos la presin arterial disminuye. El pulso vuelve a sus valores normales.     Despus de 8 horas, los niveles de monxido de carbono en la sangre vuelven a la normalidad. Aumenta el nivel de oxgeno.     Despus de 24 horas, la probabilidad de infarto comienza a disminuir. La respiracin, el cabello y el cuerpo ya no huelen a humo.     Luego de 48 horas, los nervios daados comienzan a recuperarse. Mejoran el sentido del gusto y Merchant navy officer.     Luego de 72 horas, el organismo est virtualmente libre de nicotina. Los conductos bronquiales se relajan, la respiracin se normaliza.     Despus de 2 a 12 semanas, los pulmones pueden contener ms aire. Se facilita la actividad fsica y mejora la respiracin.     Disminuir las probabilidades de sufrir ataques al corazn, derrames cerebrales, cncer o enfermedades pulmonares.     Despus de 1 ao, el riesgo de coronariopatas disminuye a la mitad.     Despus de 5 aos, el riesgo de ictus disminuye al nivel de un no fumador.     Despus de 10 aos, el riesgo de cncer de pulmn disminuye a la mitad, y el riesgo de sufrir otros tipos de cncer disminuye considerablemente.     Despus de 15 aos, el riesgo de coronariopatas disminuye, generalmente al nivel de un no fumador.     Si est embarazada, al dejar de fumar aumentar las probabilidades de tener un beb sano.     Las personas con las que [place], especialmente sus hijos, estarn ms saludables.     Tendr dinero extra para gastar en otras cosas que no Marleena Shubert cigarrillos.  CINCO CLAVES PARA DEJAR DE FUMAR Algunos estudios han demostrado que estos cinco pasos lo ayudarn a abandonar el hbito y hacerlo para siempre. Usted tiene mejores probabilidades de abandonar el hbito si cumple con estos pasos al  mismo tiempo:   1. Preprese.    2. Busque apoyo y [place].    3. Aprenda nuevas destrezas y conductas.    4. Consiga medicamentos para reducir la adiccin a la nicotina y selos correctamente.    5. Preprese para cuando sufra una recada o una situacin difcil y tome la determinacin de seguir tratando de abandonar el hbito, aunque no tenga xito al principio.  1. PREPRESE  Establezca una fecha para dejar de fumar.     Modifique su entorno.     Deshgase de TODOS los cigarrillos y ceniceros que haya en su casa, en el auto y en el lugar de [place].     No permita que nadie fume dentro de su casa.     Repase sus  intentos anteriores. Piense en qu cosas funcionaron y cules no.     Una vez que haya dejado, no fume ms, NI SIQUIERA UNA BOCANADA!  2. BUSQUE AYUDA Y ESTMULO Algunos estudios han demostrado que usted tiene mejores probabilidades de tener xito si [place]. Puede obtener apoyo de [employer]:  Dgale a sus familiares, amigos y compaeros de trabajo que usted dejar de fumar y que necesita su apoyo. Pdales que no fumen alrededor suyo y que no dejen cigarrillos a la vista.     Hable con el profesional que lo asiste (por ejemplo, con su mdico, dentista, enfermero, farmacutico, psiclogo).     Obtenga consejos y apoyo individual, grupal o telefnico. Cuanto ms consejera reciba, mejores sern las posibilidades de que deje de fumar. Hay programas que se ofrecen en hospitales y centros mdicos locales. Comunquese con el departamento de salud de su localidad para obtener informacin acerca de los programas disponibles en su rea.     Las creencias y prcticas espirituales pueden ayudar a los fumadores a abandonar el hbito.     Los medidores para dejar de fumar son pequeos programas computarizados que mantienen el registro estadstico del "tiempo de dejar", cigarrillos no fumados, dinero ahorrado (por ejemplo vase la pgina web [email or web address]).     Muchos fumadores encuentran uno o ms de los muchos libros de [place] disponibles son tiles para ayudarlo a dejar de fumar.  3. APRENDA NUEVAS DESTREZAS Y CONDUCTAS  Trate de entretenerse con otra cosa cuando sienta ganas de fumar. Hable con alguien, salga a caminar u ocpese en alguna tarea.     Cuando comience a abandonar el hbito de fumar, [place]. Utilice una ruta diferente para llegar al Aleen Campi. Beba t en vez de caf. Desayune en un lugar diferente.     Haga algo para reducir la tensin emocional. Tome un bao caliente, practique alguna actividad fsica o lea un libro.     Planee hacer cada da algo  que disfrute. Recompnsese por no fumar.     Vale la pena explorar los programas informticos interactivos que se especializan en ayudarlo a dejar de fumar.  4. OBTENGA LOS MEDICAMENTOS Y UTILCELOS CORRECTAMENTE. Hay medicamentos que pueden ayudarlo a dejar de fumar y a reducir las ganas. Si combina los [employer] con los mtodos de [place] y el apoyo pueden Nurse, learning disability las probabilidades de dejar de fumar exitosamente. La Administracin de Alimentos y Engineer, agricultural de Surveyor, minerals., (FDA) ha aprobado 7 medicamentos que lo ayudarn a International aid/development worker. Estos medicamentos se dividen en 3 categoras.  La terapia de reemplazo de nicotina (enva nicotina al organismo sin los [place] y los riesgos del fumar).     Goma de Theatre manager de nicotina-disponible sin receta  Pastillas de nicotina-disponible sin receta     Inhalador de nicotina-disponible con receta     Atomizador nasal de nicotina-disponible con receta     Parche de nicotina transdrmico-disponible con receta y sin receta.     Los antidepresivos ayudan a las personas a Animal nutritionist de Art therapist, Biomedical engineer no se conoce cul es el mecanismo.     Comprimidos de Bupropion SR-disponible con receta     Agonistas receptores de nicotina (simulan el efecto de la nicotina en el cerebro)     Comprimidos de Varenicline Tartrate-disponible con receta     Consulte con su mdico para pedir consejo acerca de los medicamentos que [place], cmo usarlos y lea detenidamente la informacin del envase.     Todos los que tratan de dejar de fumar pueden beneficiarse con el uso de un medicamento. Si est embarazada o trata de quedar embarazada, est amamantando, tiene menos de 18 aos o fuma menos de 10 cigarrillos por Futures trader, converse con su mdico antes de usar los medicamentos de reemplazo de la nicotina.     Debe dejar de usar los reemplazos de la nicotina y Freight forwarder a su mdico si siente nuseas, mareos, debilidad, mareos, vmitos, latidos cardacos rpidos, problemas en la  boca debido a las pastillas o a la goma de Theatre manager, u observa enrojecimiento o hinchazn en la piel que rodea el parche y no mejora.     No use otro producto que contenga nicotina mientras use un reemplazo.     Converse con su mdico antes de usar estos productos si tiene diabetes, sufre una enfermedad cardaca, asma o lceras de estmago, ha sufrido recientemente un infarto, tiene hipertensin arterial que no puede controlar con medicamentos, tiene una historia de latidos cardacos irregulares o le han prescripto medicamentos para ayudarlo a dejar de fumar.  5. EST PREPARADO PARA UNA RECADA O PARA SITUACIONES DIFCILES.  La mayor parte de las recadas se producen dentro de los 3 primeros meses de abandonar el hbito. No se desanime si comienza a fumar de nuevo. Recuerde, la [employer] tratan varias veces de dejar de fumar antes de lograrlo.     Podr sufrir sndrome de abstinencia porque su cuerpo est acostumbrado a la nicotina, la sustancia adictiva que [employer]. Podr sentir el deseo compulsivo de fumar, irritabilidad, enojo, tos, cefaleas y dificultad para concentrarse.     Estos sntomas son transitorios. Son ms intensos en un comienzo, pero desaparecern en 10 a 14 das.  He aqu algunas situaciones difciles de las cuales hay que estar prevenido:    Alcohol. Evite consumir alcohol. El beber disminuye sus posibilidades de xito.     Cafena. Reduzca la cantidad de cafena que consume, porque su ingesta disminuye la probabilidad de xito.     Otros fumadores. Estar rodeado de personas que fuman puede hacer que usted desee [place]. Evite a los fumadores     [employer]. Muchos fumadores aumentarn de peso cuando dejen de fumar, generalmente menos de 4,5 Kg. Consuma una dieta saludable y [place]. No deje que el aumento de peso lo distraiga de su meta principal que es dejar de fumar. Algunos medicamentos para dejar de fumar pueden retrasar el  aumento de [place]. Siempre podr perder [employer] que se gane despus de dejar de fumar.     Mal humor o depresin. Hay muchas formas de mejorar su estado de nimo en lugar de hacerlo fumando.  Si tiene problemas con alguna de estas situaciones, converse con su mdico.  SITUACIONES O CONDICIONES ESPECIALES Algunos estudios indican que todas las personas pueden dejar de fumar. Su situacin o condicin pueden darle un motivo especial para abandonar este hbito.  Mujeres embarazadas o mams recientes: Al dejar de fumar, protege la salud del beb y la suya.     Pacientes hospitalizados: Al abandonar el hbito se reducen los problemas de salud y se ayuda a la curacin.     Pacientes que han sufrido un ataque al corazn: De este modo se reduce el riesgo de un segundo ataque al corazn.     Pacientes con cncer de pulmn, cabeza y cuello: Al dejar de fumar se reduce el riesgo de un segundo cncer.     Padres de nios y adolescentes: Si abandona el hbito, protege a sus nios y Automotive engineer de enfermedades causadas por el humo como fumador pasivo.  PREGUNTAS PARA REFLEXIONAR Piense en las siguientes preguntas antes de tratar de dejar de fumar. Quizs desee hablar acerca de sus preguntas con el profesional que lo asiste.  Por qu desea dejar de fumar?     Cundo trat de dejar de fumar en el pasado, qu le ayud y qu no le ayud?     Cules sern las situaciones ms difciles para usted despus de dejar de fumar? Cmo planea manejarlas?     Quin puede ayudarlo en los momentos difciles? Su familia? Sus amigos? El profesional que lo asiste?     Qu placeres obtiene cuando fuma? De qu manera puede seguir obteniendo placer si abandona el hbito?  Estas son algunas preguntas para hacrselas al profesional que lo asiste.  Cmo puede ayudarme a dejar de fumar con xito?     Qu medicamento cree que sera el mejor para m, y cmo debo tomarlo?     Qu debo hacer si necesito ms ayuda?       Cmo es la desintoxicacin del cigarrillo? Cmo puedo obtener informacin acerca de la desintoxicacin?  Dejar de fumar es una tarea difcil y conlleva un gran esfuerzo, pero usted Buyer, retail. PARA OBTENER MS INFORMACIN Smokefree.gov (http://www.davis-sullivan.com/) provee asistencia tcnica profesional e informacin precisa, gratuita, y basada en evidencia para ayudar a las necesidades inmediatas y de largo plazo de las personas que dejan de fumar.   Document Released: 05/06/2005 Document Revised: 01/16/2011 [facility] Patient Information 2012 [place], [place].

## 2011-03-29 ENCOUNTER — Telehealth: Payer: Self-pay

## 2011-03-29 MED ORDER — CEFUROXIME AXETIL 250 MG PO TABS: 250.0000 mg | ORAL_TABLET | Freq: Two times a day (BID) | ORAL | Status: AC

## 2011-03-29 NOTE — Telephone Encounter (Signed)
i sent rx 

## 2011-03-29 NOTE — Telephone Encounter (Signed)
Pt called stating ABX is causing severe nausea and vomiting, last episode was this morning. Pt has tried taking medication with and without food but nor change in SE. Pt is requesting alternate medication, please advise.

## 2011-03-29 NOTE — Telephone Encounter (Signed)
Pt informed new ATB sent to pharmacy.

## 2011-04-16 ENCOUNTER — Telehealth: Payer: Self-pay | Admitting: *Deleted

## 2011-04-16 ENCOUNTER — Ambulatory Visit (INDEPENDENT_AMBULATORY_CARE_PROVIDER_SITE_OTHER): Payer: Self-pay | Admitting: Internal Medicine

## 2011-04-16 ENCOUNTER — Encounter: Payer: Self-pay | Admitting: Internal Medicine

## 2011-04-16 VITALS — BP 108/78 | HR 78 | Temp 98.7°F | Resp 20 | Ht 64.0 in | Wt 138.0 lb

## 2011-04-16 DIAGNOSIS — B3731 Acute candidiasis of vulva and vagina: Secondary | ICD-10-CM | POA: Insufficient documentation

## 2011-04-16 DIAGNOSIS — J329 Chronic sinusitis, unspecified: Secondary | ICD-10-CM

## 2011-04-16 DIAGNOSIS — B373 Candidiasis of vulva and vagina: Secondary | ICD-10-CM | POA: Insufficient documentation

## 2011-04-16 MED ORDER — FLUCONAZOLE 150 MG PO TABS: 150.0000 mg | ORAL_TABLET | Freq: Once | ORAL | Status: AC

## 2011-04-16 MED ORDER — AMOXICILLIN-POT CLAVULANATE 500-125 MG PO TABS: 1.0000 | ORAL_TABLET | Freq: Three times a day (TID) | ORAL | Status: AC

## 2011-04-16 NOTE — Telephone Encounter (Signed)
Pt was seen today for yeast infection & sinus infection--after pt returned home she started her menstrual cycle. Pt in conflict about taking medication and using a tampon--attempted to reach patient; no answer, no ans machine.

## 2011-04-16 NOTE — Patient Instructions (Signed)
Monilial Vaginitis Vaginitis in a soreness, swelling and redness (inflammation) of the vagina and vulva. Monilial vaginitis is not a sexually transmitted infection. CAUSES  Yeast vaginitis is caused by yeast (candida) that is normally found in your vagina. With a yeast infection, the candida has overgrown in number to a point that upsets the chemical balance. SYMPTOMS   White, thick vaginal discharge.   Swelling, itching, redness and irritation of the vagina and possibly the lips of the vagina (vulva).   Burning or painful urination.   Painful intercourse.  DIAGNOSIS  Things that may contribute to monilial vaginitis are:  Postmenopausal and virginal states.   Pregnancy.   Infections.   Being tired, sick or stressed, especially if you had monilial vaginitis in the past.   Diabetes. Good control will help lower the chance.   Birth control pills.   Tight fitting garments.   Using bubble bath, feminine sprays, douches or deodorant tampons.   Taking certain medications that kill germs (antibiotics).   Sporadic recurrence can occur if you become ill.  TREATMENT  Your caregiver will give you medication.  There are several kinds of anti monilial vaginal creams and suppositories specific for monilial vaginitis. For recurrent yeast infections, use a suppository or cream in the vagina 2 times a week, or as directed.   Anti-monilial or steroid cream for the itching or irritation of the vulva may also be used. Get your caregiver's permission.   Painting the vagina with methylene blue solution may help if the monilial cream does not work.   Eating yogurt may help prevent monilial vaginitis.  HOME CARE INSTRUCTIONS   Finish all medication as prescribed.   Do not have sex until treatment is completed or after your caregiver tells you it is okay.   Take warm sitz baths.   Do not douche.   Do not use tampons, especially scented ones.   Wear cotton underwear.   Avoid tight  pants and panty hose.   Tell your sexual partner that you have a yeast infection. They should go to their caregiver if they have symptoms such as mild rash or itching.   Your sexual partner should be treated as well if your infection is difficult to eliminate.   Practice safer sex. Use condoms.   Some vaginal medications cause latex condoms to fail. Vaginal medications that harm condoms are:   Cleocin cream.   Butoconazole (Femstat).   Terconazole (Terazol) vaginal suppository.   Miconazole (Monistat) (may be purchased over the counter).  SEEK MEDICAL CARE IF:   You have a temperature by mouth above 102 F (38.9 C).   The infection is getting worse after 2 days of treatment.   The infection is not getting better after 3 days of treatment.   You develop blisters in or around your vagina.   You develop vaginal bleeding, and it is not your menstrual period.   You have pain when you urinate.   You develop intestinal problems.   You have pain with sexual intercourse.  Document Released: 02/13/2005 Document Revised: 01/16/2011 Document Reviewed: 10/28/2008 [facility] Patient Information 2012 [place], [place].Sinusitis Sinuses are air pockets within the bones of your face. The growth of bacteria within a sinus leads to infection. The infection prevents the sinuses from draining. This infection is called sinusitis. SYMPTOMS  There will be different areas of pain depending on which sinuses have become infected.  The maxillary sinuses often produce pain beneath the eyes.   Frontal sinusitis may cause pain in the  middle of the forehead and above the eyes.  Other problems (symptoms) include:  Toothaches.   Colored, pus-like (purulent) drainage from the nose.   Swelling, warmth, and tenderness over the sinus areas may be signs of infection.  TREATMENT  Sinusitis is most often determined by an exam.X-rays may be taken. If x-rays have been taken, make sure you obtain your results  or find out how you are to obtain them. Your caregiver may give you medications (antibiotics). These are medications that will help kill the bacteria causing the infection. You may also be given a medication (decongestant) that helps to reduce sinus swelling.  HOME CARE INSTRUCTIONS   Only take over-the-counter or prescription medicines for pain, discomfort, or fever as directed by your caregiver.   Drink extra fluids. Fluids help thin the mucus so your sinuses can drain more easily.   Applying either moist heat or ice packs to the sinus areas may help relieve discomfort.   Use saline nasal sprays to help moisten your sinuses. The sprays can be found at your local drugstore.  SEEK IMMEDIATE MEDICAL CARE IF:  You have a fever.   You have increasing pain, severe headaches, or toothache.   You have nausea, vomiting, or drowsiness.   You develop unusual swelling around the face or trouble seeing.  MAKE SURE YOU:   Understand these instructions.   Will watch your condition.   Will get help right away if you are not doing well or get worse.  Document Released: 05/06/2005 Document Revised: 01/16/2011 Document Reviewed: 12/03/2006 [facility] Patient Information 2012 [place], [place].

## 2011-04-17 ENCOUNTER — Encounter: Payer: Self-pay | Admitting: Internal Medicine

## 2011-04-17 NOTE — Assessment & Plan Note (Signed)
   Start diflucan

## 2011-04-17 NOTE — Assessment & Plan Note (Signed)
It does not sound like the doxy was an effective treatment so I have asked her to start augmentin

## 2011-04-17 NOTE — Progress Notes (Signed)
Subjective:    Patient ID: Katherine Cruz, female    DOB: 1966-04-04, 45 y.o.   MRN: [id number]  Sinusitis This is a recurrent problem. The current episode started 1 to 4 weeks ago. The problem has been gradually worsening since onset. There has been no fever. The pain is mild. Associated symptoms include chills, congestion and sinus pressure. Pertinent negatives include no coughing, diaphoresis, ear pain, headaches, hoarse voice, neck pain, shortness of breath, sneezing, sore throat or swollen glands. Treatments tried: doxycycline. The treatment provided no relief.      Review of Systems  Constitutional: Positive for chills. Negative for fever, diaphoresis, activity change, appetite change, fatigue and unexpected weight change.  HENT: Positive for congestion, rhinorrhea, postnasal drip and sinus pressure. Negative for ear pain, sore throat, hoarse voice, facial swelling, sneezing, trouble swallowing, neck pain, neck stiffness and voice change.   Eyes: Negative.   Respiratory: Negative for cough, shortness of breath, wheezing and stridor.   Cardiovascular: Negative for chest pain, palpitations and leg swelling.  Gastrointestinal: Negative for nausea, vomiting, abdominal pain, diarrhea and constipation.  Genitourinary: Positive for vaginal pain (redness and irritation). Negative for dysuria, urgency, frequency, hematuria, flank pain, decreased urine volume, vaginal bleeding, vaginal discharge, enuresis, difficulty urinating, genital sores, menstrual problem, pelvic pain and dyspareunia.  Musculoskeletal: Negative for back pain, joint swelling, arthralgias and gait problem.  Skin: Negative for color change, pallor, rash and wound.  Neurological: Negative for dizziness, tremors, seizures, syncope, facial asymmetry, speech difficulty, weakness, light-headedness, numbness and headaches.  Hematological: Negative for adenopathy. Does not bruise/bleed easily.  Psychiatric/Behavioral: Negative.          Objective:   Physical Exam  Vitals reviewed. Constitutional: She is oriented to person, place, and time. She appears well-developed and well-nourished. No distress.  HENT:  Head: No trismus in the jaw.  Right Ear: Hearing, tympanic membrane, external ear and ear canal normal.  Left Ear: Hearing, tympanic membrane, external ear and ear canal normal.  Nose: Mucosal edema and rhinorrhea present. No nose lacerations, sinus tenderness, nasal deformity, septal deviation or nasal septal hematoma. No epistaxis.  No foreign bodies. Right sinus exhibits maxillary sinus tenderness. Right sinus exhibits no frontal sinus tenderness. Left sinus exhibits maxillary sinus tenderness. Left sinus exhibits no frontal sinus tenderness.  Mouth/Throat: Oropharynx is clear and moist and mucous membranes are normal. Mucous membranes are not pale, not dry and not cyanotic. No uvula swelling. No oropharyngeal exudate, posterior oropharyngeal edema, posterior oropharyngeal erythema or tonsillar abscesses.  Eyes: Conjunctivae are normal. Right eye exhibits no discharge. Left eye exhibits no discharge. No scleral icterus.  Neck: Normal range of motion. Neck supple. No JVD present. No tracheal deviation present. No thyromegaly present.  Cardiovascular: Normal rate, regular rhythm, normal heart sounds and intact distal pulses.  Exam reveals no gallop and no friction rub.   No murmur heard. Pulmonary/Chest: Effort normal and breath sounds normal. No stridor. No respiratory distress. She has no wheezes. She has no rales. She exhibits no tenderness.  Abdominal: Soft. Bowel sounds are normal. She exhibits no distension and no mass. There is no tenderness. There is no rebound and no guarding.  Musculoskeletal: Normal range of motion. She exhibits no edema and no tenderness.  Lymphadenopathy:    She has no cervical adenopathy.  Neurological: She is oriented to person, place, and time.  Skin: Skin is warm and dry. No rash noted. She  is not diaphoretic. No erythema. No pallor.  Psychiatric: She has a normal mood and affect.  Her behavior is normal. Judgment and thought content normal.          Assessment & Plan:

## 2012-06-01 ENCOUNTER — Emergency Department (HOSPITAL_COMMUNITY)
Admission: EM | Admit: 2012-06-01 | Discharge: 2012-06-01 | Disposition: A | Discharge status: Home / Self Care | Payer: Self-pay | Attending: Emergency Medicine | Admitting: Emergency Medicine

## 2012-06-01 ENCOUNTER — Encounter (HOSPITAL_COMMUNITY): Payer: Self-pay | Admitting: *Deleted

## 2012-06-01 ENCOUNTER — Emergency Department (HOSPITAL_COMMUNITY): Payer: Self-pay

## 2012-06-01 DIAGNOSIS — R0789 Other chest pain: Secondary | ICD-10-CM | POA: Insufficient documentation

## 2012-06-01 DIAGNOSIS — R0602 Shortness of breath: Secondary | ICD-10-CM | POA: Insufficient documentation

## 2012-06-01 DIAGNOSIS — Z87440 Personal history of urinary (tract) infections: Secondary | ICD-10-CM | POA: Insufficient documentation

## 2012-06-01 DIAGNOSIS — R42 Dizziness and giddiness: Secondary | ICD-10-CM | POA: Insufficient documentation

## 2012-06-01 DIAGNOSIS — F17200 Nicotine dependence, unspecified, uncomplicated: Secondary | ICD-10-CM | POA: Insufficient documentation

## 2012-06-01 DIAGNOSIS — R002 Palpitations: Secondary | ICD-10-CM | POA: Insufficient documentation

## 2012-06-01 DIAGNOSIS — Z8709 Personal history of other diseases of the respiratory system: Secondary | ICD-10-CM | POA: Insufficient documentation

## 2012-06-01 DIAGNOSIS — Z8679 Personal history of other diseases of the circulatory system: Secondary | ICD-10-CM | POA: Insufficient documentation

## 2012-06-01 DIAGNOSIS — Z8742 Personal history of other diseases of the female genital tract: Secondary | ICD-10-CM | POA: Insufficient documentation

## 2012-06-01 LAB — CBC
MCH: 30.5 pg (ref 26.0–34.0)
MCV: 90.1 fL (ref 78.0–100.0)
Platelets: 236 10*3/uL (ref 150–400)
RDW: 13.8 % (ref 11.5–15.5)

## 2012-06-01 LAB — BASIC METABOLIC PANEL
CO2: 26 mEq/L (ref 19–32)
Calcium: 9.6 mg/dL (ref 8.4–10.5)
Creatinine, Ser: 0.55 mg/dL (ref 0.50–1.10)
GFR calc Af Amer: >90 mL/min (ref >90)

## 2012-06-01 LAB — POCT I-STAT TROPONIN I: Troponin i, poc: 0 ng/mL (ref 0.00–0.08)

## 2012-06-01 NOTE — ED Notes (Signed)
Pt has been having some intermittent chest tightness adn shortness of breath.  Pt has been having more palpitations and states that blood pressure dropped and thought she was going to pass out

## 2012-06-01 NOTE — ED Notes (Signed)
Pt reports she has hx of palpitations and today she had an episode of strong and fluttering palpitations today along with extreme lightheadedness, lasted for a few seconds. Pt reports now she is feeling completely fine now. Pt in nad. Skin warm and dry. Pt ambulated to room with no issues.

## 2012-06-01 NOTE — ED Notes (Signed)
Pharmacy at bedside

## 2012-06-01 NOTE — ED Provider Notes (Signed)
History     CSN: [id number]  Arrival date & time 06/01/12  1346   First MD Initiated Contact with Patient 06/01/12 1741      Chief Complaint  Patient presents with  . Palpitations  . Chest Pain    (Consider location/radiation/quality/duration/timing/severity/associated sxs/prior treatment) HPI Comments: Corinthian Kemler is a 47 y.o. Female who states that she had an unusual palpitation today. While at rest, she developed rapid heartbeat, associated with a lightheaded feeling. The symptoms were similar to those she has had in the past, but more intense. The symptoms resolved within 1 minute. They have not recurred since. She has had intermittent symptoms like this for years. She's been treated in the past with Toprol for tachycardia. She has been told she has mitral valve prolapse. The last cardiac echo in 2006, did not show mitral valve prolapse. She does not see a cardiologist, to take any medications. She admits to increased stress in her life regarding home situations. She continues to smoke, tobacco. There are no modifying factors.  Patient is a 47 y.o. female presenting with palpitations and chest pain. The history is provided by the patient.  Palpitations  Associated symptoms include chest pain.  Chest Pain Primary symptoms include palpitations.     Past Medical History  Diagnosis Date  . Fibrocystic breast   . Ovarian cyst   . Allergic rhinitis   . Mitral valve prolapse   . Positive PPD   . UTI (urinary tract infection)   . Breast mass 08/23/2010    Past Surgical History  Procedure Date  . Tubal ligation   . Cervical polypectomy     Family History  Problem Relation Age of Onset  . Allergies Father   . Heart disease Neg Hx   . Hypertension Neg Hx   . Diabetes Neg Hx   . Cancer Neg Hx     breast or colon  . Stroke Neg Hx   . Kidney disease Brother     History  Substance Use Topics  . Smoking status: Current Every Day Smoker -- 0.3 packs/day for 25 years   Types: Cigarettes  . Smokeless tobacco: Not on file  . Alcohol Use: Yes    OB History    Grav Para Term Preterm Abortions TAB SAB Ect Mult Living                  Review of Systems  Cardiovascular: Positive for chest pain and palpitations.  All other systems reviewed and are negative.    Allergies  Latex  Home Medications  No current outpatient prescriptions on file.  BP 104/50  Pulse 93  Temp 98.8 F (37.1 C) (Oral)  Resp 15  SpO2 100%  Physical Exam  Nursing note and vitals reviewed. Constitutional: She is oriented to person, place, and time. She appears well-developed and well-nourished.  HENT:  Head: Normocephalic and atraumatic.  Eyes: Conjunctivae normal and EOM are normal. Pupils are equal, round, and reactive to light.  Neck: Normal range of motion and phonation normal. Neck supple.  Cardiovascular: Normal rate, regular rhythm and intact distal pulses.   Pulmonary/Chest: Effort normal and breath sounds normal. She exhibits no tenderness.  Abdominal: Soft. She exhibits no distension. There is no tenderness. There is no guarding.  Musculoskeletal: Normal range of motion.  Neurological: She is alert and oriented to person, place, and time. She has normal strength. She exhibits normal muscle tone.  Skin: Skin is warm and dry.  Psychiatric: She has a normal  mood and affect. Her behavior is normal. Judgment and thought content normal.    ED Course  Procedures (including critical care time)  Cardiac monitor showed normal sinus rhythm. No arrhythmias seen during the period of observation on cardiac monitor     Date: 03/06/2012  Rate: 90  Rhythm: normal sinus rhythm  QRS Axis: normal  PR and QT Intervals: normal  ST/T Wave abnormalities: normal  PR and QRS Conduction Disutrbances:none  Narrative Interpretation:   Old EKG Reviewed: none available    Labs Reviewed  CBC  BASIC METABOLIC PANEL  POCT I-STAT TROPONIN I   Dg Chest 2 View  06/01/2012   *RADIOLOGY REPORT*  Clinical Data: Palpitations  CHEST - 2 VIEW  Comparison: None.  Findings: Cardiomediastinal silhouette is unremarkable.  No acute infiltrate or pleural effusion.  No pulmonary edema.  Mild degenerative changes thoracic spine.  IMPRESSION: No active disease.  Mild degenerative changes thoracic spine.   Original Report Authenticated By: Natasha Mead, M.D.    Nursing notes, applicable records and vitals reviewed.  Radiologic Images/Reports reviewed.   1. Palpitations       MDM  Nonspecific palpitations, negative evaluation in ED. Doubt ACS, PE, pneumonia, or metabolic instability. Patient stable for discharge.    Plan: Home Medications- none; Home Treatments- rest, stop smoking; Recommended follow up- cardiology 1-2 weeks        Flint Melter, MD 06/02/12 [id number]

## 2014-01-03 ENCOUNTER — Encounter: Payer: Self-pay | Admitting: *Deleted

## 2014-01-03 DIAGNOSIS — M159 Polyosteoarthritis, unspecified: Secondary | ICD-10-CM | POA: Insufficient documentation

## 2014-01-03 DIAGNOSIS — IMO0001 Reserved for inherently not codable concepts without codable children: Secondary | ICD-10-CM | POA: Insufficient documentation

## 2014-01-03 DIAGNOSIS — M659 Synovitis and tenosynovitis, unspecified: Secondary | ICD-10-CM | POA: Insufficient documentation

## 2014-12-07 ENCOUNTER — Encounter: Payer: Self-pay | Admitting: Gastroenterology

## 2015-01-25 ENCOUNTER — Ambulatory Visit (AMBULATORY_SURGERY_CENTER): Payer: Self-pay | Admitting: *Deleted

## 2015-01-25 VITALS — Ht 64.0 in | Wt 131.0 lb

## 2015-01-25 DIAGNOSIS — Z1211 Encounter for screening for malignant neoplasm of colon: Secondary | ICD-10-CM

## 2015-01-25 MED ORDER — NA SULFATE-K SULFATE-MG SULF 17.5-3.13-1.6 GM/177ML PO SOLN: ORAL | Status: DC

## 2015-01-25 NOTE — Progress Notes (Signed)
Patient denies any allergies to eggs or soy. Patient denies any problems with anesthesia/sedation. Patient denies any oxygen use at home and does not take any diet/weight loss medications. EMMI education assisgned to patient on colonoscopy, this was explained and instructions given to patient. 

## 2015-02-08 ENCOUNTER — Encounter: Payer: Self-pay | Admitting: Gastroenterology

## 2015-03-27 ENCOUNTER — Encounter: Payer: Self-pay | Admitting: Gastroenterology

## 2015-04-03 ENCOUNTER — Encounter: Payer: Medicaid Other | Admitting: Gastroenterology

## 2015-04-03 ENCOUNTER — Telehealth: Payer: Self-pay | Admitting: Gastroenterology

## 2015-04-03 NOTE — Telephone Encounter (Signed)
Please charge. [facility] does not have an on call nurse on weekends or nights.

## 2015-08-31 DIAGNOSIS — M797 Fibromyalgia: Secondary | ICD-10-CM | POA: Insufficient documentation

## 2015-09-04 ENCOUNTER — Ambulatory Visit: Payer: Medicaid Other | Attending: Urology | Admitting: Physical Therapy

## 2015-09-04 ENCOUNTER — Encounter: Payer: Self-pay | Admitting: Physical Therapy

## 2015-09-04 DIAGNOSIS — M6281 Muscle weakness (generalized): Secondary | ICD-10-CM | POA: Diagnosis present

## 2015-09-04 NOTE — Patient Instructions (Signed)
Heel Slides    Squeeze pelvic floor and hold. Slide left heel along bed towards bottom. Hold for ___ seconds. Slide back to flat knee position. Repeat 10___ times. Do _1__ times a day. Repeat with other leg.    Copyright  VHI. All rights reserved.  Quad Sets    Squeeze pelvic floor and hold. Tighten top of left thigh. Hold for _10__ seconds. Relax for ___ seconds. Repeat __1_ times. Do _1__ times a day. Repeat with other leg.    Copyright  VHI. All rights reserved.   Straight Leg Raise    Squeeze pelvic floor and hold. Tighten top of left thigh. Raise leg off bed ___ inches. Hold for __1_ seconds. Relax for __1_ seconds. Repeat 10___ times. Do _1__ times a day. Repeat with other leg.    Copyright  VHI. All rights reserved.    Bracing With Arms / Legs (Hook-Lying)    With neutral spine, tighten pelvic floor and abdominals and hold. Raise arm and opposite leg, then return. Repeat wtih other limbs. Repeat _10__ times. Do _1__ times a day.   Copyright  VHI. All rights reserved.  Quick Contraction: Gravity Resisted (Sitting)    Sitting, quickly squeeze then fully relax pelvic floor. Perform __1_ sets of __5_. Rest for _1__ seconds between sets. Do __3_ times a day. And in standing Copyright  VHI. All rights reserved.    Slow Contraction: Gravity Resisted (Sitting)    Sitting, slowly squeeze pelvic floor for _10__ seconds. Rest for _5__ seconds. Repeat _10__ times. Do __3_ times a day.  Copyright  VHI. All rights reserved.  Pelvic Rotation: Contract / Relax (Supine)    With knees bent over bolster, right knee crossed over other, press thighs together tightly without allowing movement. Hold __6__ seconds. Relax. Repeat _3___ times per set. Do _1___ sets per session. Do _1___ sessions per day. When left bone is higher than right.  http://orth.exer.us/280   Copyright  VHI. All rights reserved.  Massage left side of pelvic floor 3-5 min daily if there is  tenderness.   [facility] [address], [address] [place], [place] [address] Phone # [phone] Fax [phone]

## 2015-09-04 NOTE — Therapy (Signed)
[facility] [address] [place], [place], [address] Phone: [phone]   Fax:  [phone]  Physical Therapy Evaluation  Patient Details  Name: Katherine Cruz MRN: [id number] Date of Birth: 01/25/1966 Referring Provider: Dr. Wendy Poet  Encounter Date: 09/04/2015      PT End of Session - 09/04/15 1515    Visit Number 1   PT Start Time 6010   PT Stop Time 1525   PT Time Calculation (min) 40 min   Activity Tolerance Patient tolerated treatment well   Behavior During Therapy [facility] for tasks assessed/performed      Past Medical History  Diagnosis Date  . Fibrocystic breast   . Ovarian cyst   . Allergic rhinitis   . Mitral valve prolapse   . Positive PPD   . UTI (urinary tract infection)   . Breast mass 08/23/2010  . PVC (premature ventricular contraction)   . PAC (premature atrial contraction)   . Osteoarthritis   . Fibromyalgia     Past Surgical History  Procedure Laterality Date  . Tubal ligation    . Cervical polypectomy    . Replantation thumb    . Maxillary sinus lift      There were no vitals filed for this visit.       Subjective Assessment - 09/04/15 1450    Subjective Patient reports she has to push her urine out easily or has to sit crooked.  Gynecologist said everything is normal.  MD said the bladder does not relax. Goes on for several years.   Patient Stated Goals improve stream of urine and not have to push, or sit correctly   Currently in Pain? No/denies   Multiple Pain Sites No            [facility] PT Assessment - 09/04/15 0001    Assessment   Medical Diagnosis R39.12 Weak urinary stream   Referring Provider Dr. Wendy Poet   Onset Date/Surgical Date 09/03/13   Prior Therapy None   Precautions   Precautions None   Restrictions   Weight Bearing Restrictions No   Balance Screen   Has the patient fallen in the past 6 months No   Has the patient had a decrease in activity level  because of a fear of falling?  No   Is the patient reluctant to leave their home because of a fear of falling?  No   Home Ecologist residence   Prior Function   Level of Independence Independent   Leisure yoga   Cognition   Overall Cognitive Status Within Functional Limits for tasks assessed   ROM / Strength   AROM / PROM / Strength AROM;PROM;Strength   Palpation   SI assessment  left post. rotated; sacrum rotated left                 Pelvic Floor Special Questions - 09/04/15 0001    Prior Pregnancies Yes   Number of Pregnancies 4   Number of Vaginal Deliveries 4   Urinary Leakage Yes   Pad use 0   Activities that cause leaking Sneezing  after waiting too long for urination   Exam Type Deferred                  PT Education - 09/04/15 1515    Education provided Yes   Education Details pelvic floor exercise, abdominal exercies, knee exercise for abdominal   Person(s) Educated Patient   Methods Explanation;Demonstration;Verbal cues;Handout  Comprehension Returned demonstration;Verbalized understanding             PT Long Term Goals - 09/04/15 1518    PT LONG TERM GOAL #1   Title Independent with HEP and how to progress herself for pelvic floor strength   Time 1   Period Days   Status Achieved               Plan - 09/04/15 1515    Clinical Impression Statement Patient is a 50 year old female with diagnosis of weak stream of urine that started 2 years ago with sudden onset.  Patient has to strain to urinate.  Patient has to sit on the left side to assist to urinate.  Patient reports urinary leakage on occcasion with sneezing or urge with full bladder.  Left ilum was rotated posteriorly and sacrum rotated left. Patient deferred therapist  to assess pelvic floor strength. Patient is only coming for 1 visit due to insurance.    Rehab Potential Excellent   PT Frequency 1x / week   PT Duration --  1 session   PT  Treatment/Interventions Therapeutic exercise   PT Next Visit Plan Discharge to HEP due to 1 visit only   PT Home Exercise Plan Current HEP   Recommended Other Services None   Consulted and Agree with Plan of Care Patient      Patient will benefit from skilled therapeutic intervention in order to improve the following deficits and impairments:  Decreased strength  Visit Diagnosis: Muscle weakness (generalized) - Plan: PT plan of care cert/re-cert     Problem List Patient Active Problem List   Diagnosis Date Noted  . Synovitis 01/03/2014  . Generalized osteoarthrosis, involving multiple sites 01/03/2014  . Myalgia and myositis, unspecified 01/03/2014  . Vaginitis due to Candida 04/16/2011  . Sinusitis 04/16/2011  . Myalgia and myositis 11/30/2010  . ALLERGIC RHINITIS 11/24/2009  . OVARIAN CYST 11/24/2009  . MITRAL VALVE PROLAPSE, HX OF 11/24/2009    Earlie Counts, PT 09/04/2015 3:22 PM   [facility] [address], [place], [place], [address] Phone: [phone]   Fax:  [phone]  Name: Katherine Cruz MRN: [id number] Date of Birth: 31-Oct-1965   PHYSICAL THERAPY DISCHARGE SUMMARY  Visits from Start of Care: 1  Current functional level related to goals / functional outcomes: See above.  Remaining deficits: See above  Education / Equipment: HEP Plan: Patient agrees to discharge.  Patient goals were met. Patient is being discharged due to meeting the stated rehab goals.  Thank you for the referral. Earlie Counts, PT 09/04/2015 3:22 PM  ????

## 2016-08-05 ENCOUNTER — Encounter: Payer: Medicare Other | Attending: Pediatrics | Admitting: *Deleted

## 2016-08-05 ENCOUNTER — Encounter: Payer: Self-pay | Admitting: *Deleted

## 2016-08-05 DIAGNOSIS — Z713 Dietary counseling and surveillance: Secondary | ICD-10-CM | POA: Diagnosis present

## 2016-08-05 DIAGNOSIS — M7501 Adhesive capsulitis of right shoulder: Secondary | ICD-10-CM | POA: Insufficient documentation

## 2016-08-05 DIAGNOSIS — F419 Anxiety disorder, unspecified: Secondary | ICD-10-CM | POA: Diagnosis present

## 2016-08-05 DIAGNOSIS — F509 Eating disorder, unspecified: Secondary | ICD-10-CM | POA: Diagnosis not present

## 2016-08-05 NOTE — Progress Notes (Signed)
Medical Nutrition Therapy:  Appt start time: 1430 end time:  1545.   Assessment:  Primary concerns today:  Thinks she is depressed.  Has PTSD, anxiety, OCD fibromyalgia and a  Lot of pain.  Sees several specialists Thinks she is post menopausal Is currently in disability due to her medical challenges.   Feels like her eating is out of control For many years was health conscious and ate "well".  After her divorce she feels like she can't do it any more.  Thinks she is an Surveyor, quantityemotional eater.  Is eating for pleasure  Is seeing a therapist.  Gained 10 pounds.  Feels out of control.  Wants some accountability.    Was obese as a child and lost 30 pounds Wants someone to tell her what to do Has been through a lot emotionally. Has been in therapy for 3 years, but changed to a new provider due to insurance.  It's going ok so far, but Val struggles  Relationship with food is hurting her A year ago had knee injury and was not able to do yoga the way she wanted.  Also developed should injury As she started to eat more , she stopped taking care of herself in other ways.    Eats all day long.  Is not hungry, but eats for her anxiety Does not want to take anxiety medication due to family history of substance abuse  Is under a significant amount of stress Complains of high levels of swelling in her face and fingers.  Could it be food? Horribly restrictive diet when she was a child. Her own children were very picky  Administered EAT-26 Score significant> 20 Patient score: 24  MEDICATIONS: see list   DIETARY INTAKE:  Usual eating pattern includes 1 meals and multiple snacks per day. Loves toast with PB and jelly.  Eats multiple/day Chocolate chip cookies, candies, ice cream Soy chocolate milk   Avoided foods include processed food, sugar, salt, fried, high fat, saturated fat, GMO         24-hr recall:  Snk ( PM): cookies, frozen yogurt D ( PM): large garden salad   Usual physical  activity: none    Nutritional Diagnosis:  NB-1.5 Disordered eating pattern As related to meal skipping and emotional eating.  As evidenced by self-report.    Intervention:  Nutrition counseling provided.  Discussed food is fuel and what happens when the body doesn't get enough.  Discussed how restriction leads to binging and how labeling foods as bad makes them more appealing.  Discussed healing her relationship with food.  Recommended 3 balanced meals at the table    Monitoring/Evaluation:  Dietary intaket in a few week(s).

## 2016-08-19 ENCOUNTER — Encounter: Payer: Medicare Other | Attending: Pediatrics | Admitting: *Deleted

## 2016-08-19 DIAGNOSIS — M7501 Adhesive capsulitis of right shoulder: Secondary | ICD-10-CM | POA: Insufficient documentation

## 2016-08-19 DIAGNOSIS — F419 Anxiety disorder, unspecified: Secondary | ICD-10-CM | POA: Insufficient documentation

## 2016-08-19 DIAGNOSIS — F509 Eating disorder, unspecified: Secondary | ICD-10-CM | POA: Diagnosis present

## 2016-08-19 DIAGNOSIS — Z713 Dietary counseling and surveillance: Secondary | ICD-10-CM | POA: Diagnosis present

## 2016-08-19 NOTE — Patient Instructions (Signed)
Small breakfast Balanced lunch Then tea Balanced dinner   Check out cooking classes Engineer, building services) Also there are Spanish cooking videos online Possibly meditation videos  Breakfast:   Toast with peanut butter, orange juice  Possibly oatmeal with fruit   Lunch:  Soup with sides, crackers and toast  Grilled meat with sides  Salad with tofu and crackers or toast and fruit  Welcome to go out   Afternoon snack  coffee with toast and PB  Yogurt  Jelly with toast or yogurt  Dinner  Soup with sides, crackers and toast  Grilled meat with sides  Salad with tofu and crackers or toast and fruit  Welcome to go out  Your life does not have to stop when you are heavier.  You can be fabulous now  Live in the middle. It's not black and white/all or nothing  Live in the middle

## 2016-08-19 NOTE — Progress Notes (Signed)
Medical Nutrition Therapy:  Appt start time: 1400 end time:  1500.   Assessment:  Primary concerns today: she is here for follow up nutrition counseling pertaining to disordered eating and subsequent weight gain.  At last visit, she stated she was eating 1 meals and snacking, this provider recommended 3 structured meals.  Since then, she is eating less than before.  3 meals is less food than the amount of snacking she was doing.  She also eats less at her meal (used to go to buffet) Has not slept well in 2 weeks.  Thinks it is tied into her eating.  Feels that when she eats less she doesn't sleep.   Thinks she lost weight.     No GI distress Feels very hungry.  dizziness and feeling cold.  Feels like she was starving again Feels she is eating WAY less than before Was eating out and eating buffet So while she was eating 1 meal, it was a very large meal.  Also snacked all day long Found some pleasure in the pain of starvation  When at home, she was eating all over the place- at the couch. She switched to eating at the table. Thinks she is a fast eater.  She is trying to be more mindful and take longer since the food amount was less  Normal eating to her is Small breakfast Balanced lunch Then tea Balanced dinner (big)  Would like to get back to this    MEDICATIONS: see list   DIETARY INTAKE:  Usual eating pattern includes 1 meals and multiple snacks per day. Loves toast with PB and jelly.  Eats multiple/day Chocolate chip cookies, candies, ice cream Soy chocolate milk   Avoided foods include processed food, sugar, salt, fried, high fat, saturated fat, GMO         24-hr recall:  24 hour recall B: 2 slices toast with PB and banana L: toast, yogurt with olive oil and mint.  OJ D: chipotle  Wheat biscuit cereal   Usual physical activity: none    Nutritional Diagnosis:  NB-1.5 Disordered eating pattern As related to meal skipping and emotional eating.  As evidenced by  self-report.    Intervention:  Nutrition counseling provided. She certainly does not need to be eating less.  She is reporting symptoms of inadequate intake.  There must be a balance between binge eating/emotional eating, and restriction.  Her version of "normal" is an [place] meal pattern of large lunch and dinner and small breakfast and afternoon tea.  She is hopeful about trying this plan Discussed all or nothing mentality and trying to find a compromise.  Discussed cooking shows/cooking classes Suggested meditation Gave body image booklet in Spanish (her native language is [place])  She is interested in group work, but not able to afford at this time    Monitoring/Evaluation:  Dietary intaket in a few week(s).

## 2016-09-02 ENCOUNTER — Encounter: Payer: Medicare Other | Admitting: *Deleted

## 2016-09-02 DIAGNOSIS — F509 Eating disorder, unspecified: Secondary | ICD-10-CM

## 2016-09-02 NOTE — Patient Instructions (Addendum)
Multivitamin (Centrum or One A Day) Calcium (1200 mg) and vitamin D (at least 400 IU)/day Omega 3 (Nature's Made or Nordic Naturals about 500 mg/day) Possibly probiotic: Culturelle or kefir  You need fat.  You need it for satisfaction and density/ Try olive oil, avocado, nuts  What does feeling heavy mean?   Watch Embrace on Netflix.      Call about body image support group at [facility].  Ask about scholarship.  Mention my name,Katherine Cruz [phone]

## 2016-09-02 NOTE — Progress Notes (Signed)
Medical Nutrition Therapy:  Appt start time: 1030 end time:  1130.   Assessment:  Primary concerns today: she is here for follow up nutrition counseling pertaining to disordered eating and subsequent weight gain. Has a tic bite and it is worrying her.  Plans to get that checked out  Has been trying to follow nutrition recommendations Is feeling really bloated.  Otherwise feels like she is more balanced.  She is no longer starving.   No N/V, some heartburn- no medicine Normal BM No dizziness   High anxiety around fat and sugar.  Poor body image  MEDICATIONS: see list   DIETARY INTAKE:  Avoided foods include processed food, sugar, salt, fried, high fat, saturated fat, GMO         24-hr recall:  B: 2 toast with PB and OJ coffee L: chicken, salad from [employer].  Banana S: 2 toast with yogurt D: soup  Usual physical activity: none    Nutritional Diagnosis:  NB-1.5 Disordered eating pattern As related to meal skipping and emotional eating.  As evidenced by self-report.    Intervention:  Nutrition counseling provided. Challenged diet mentality/all or nothing/eating disorder voice.  Explained how fats and sugars are healthy and important.  Explained delayed stomach emptying from malnutrition/decreased metabolism.  Her body needs adequate calories  Discussed vitamins, probiotics, watching Embrace   Monitoring/Evaluation:  Dietary intaket in 1 week(s).

## 2016-09-09 ENCOUNTER — Encounter: Payer: Medicare Other | Admitting: *Deleted

## 2016-09-09 DIAGNOSIS — F509 Eating disorder, unspecified: Secondary | ICD-10-CM | POA: Diagnosis not present

## 2016-09-09 NOTE — Progress Notes (Signed)
Medical Nutrition Therapy:  Appt start time: 1400  end time:  1430   Assessment:  Primary concerns today: she is here for follow up nutrition counseling pertaining to disordered eating and subsequent weight gain.  Being treated for lyme disease.  Is not supposed to take vitamins while taking medication Antibiotic makes her nauseated  Got a gym membership and wants to get back to yoga   Very anxious because she will be attending a wedding in 1 month and she wants to lose 10 pounds by then.  She is very anxious about her body and thinks she won't have a good time at the wedding unless she looses weight    MEDICATIONS: see list   DIETARY INTAKE:  Avoided foods include processed food, sugar, salt, fried, high fat, saturated fat, GMO         24-hr recall:  B: 2 toast with PB and OJ coffee L: chicken, salad from [employer].  Banana S: 2 toast with yogurt D: soup  Usual physical activity: none    Nutritional Diagnosis:  NB-1.5 Disordered eating pattern As related to meal skipping and emotional eating.  As evidenced by self-report.    Intervention:  Nutrition counseling provided. Challenged diet mentality/all or nothing/eating disorder voice.  Discussed ways to challenge body image: Embrace and Underneath we are all women  Monitoring/Evaluation:  Dietary intaket in 1 week(s).

## 2016-09-09 NOTE — Patient Instructions (Signed)
Check out Embrace on Netflix Check out Underneath, we are all Woman online Please consider eating enough and not dieting consider water exercises

## 2016-09-19 ENCOUNTER — Ambulatory Visit: Payer: Medicaid Other | Admitting: *Deleted

## 2016-09-26 ENCOUNTER — Ambulatory Visit: Payer: Medicaid Other | Admitting: *Deleted

## 2016-09-30 ENCOUNTER — Encounter: Payer: Medicare Other | Attending: Pediatrics | Admitting: *Deleted

## 2016-09-30 DIAGNOSIS — F509 Eating disorder, unspecified: Secondary | ICD-10-CM | POA: Diagnosis present

## 2016-09-30 DIAGNOSIS — F419 Anxiety disorder, unspecified: Secondary | ICD-10-CM | POA: Diagnosis present

## 2016-09-30 DIAGNOSIS — M7501 Adhesive capsulitis of right shoulder: Secondary | ICD-10-CM | POA: Diagnosis not present

## 2016-09-30 DIAGNOSIS — Z713 Dietary counseling and surveillance: Secondary | ICD-10-CM | POA: Insufficient documentation

## 2016-09-30 NOTE — Patient Instructions (Addendum)
Please watch Embrace Check out [email or web address] Check out Underneath We are all Woman  Consider ready Embody by William Daltononnie Sobczak  Check Tess Holiday, Ashley JacobsAshley Graham, [place]

## 2016-09-30 NOTE — Progress Notes (Signed)
Medical Nutrition Therapy:  Appt start time: 1430  end time:  1530   Assessment:  Primary concerns today: she is here for follow up nutrition counseling pertaining to disordered eating and subsequent weight gain.  Is being evaluated for possibly lyme disease.  States she gets tic bites all the time and she is very concerned Might move to a more urban area to get away. Gets her lyme disease  States she is eating more healthy, is not  Is going to the wedding.  Wants to lose more weight. Is not able to lose the weight as she once was able to Wants lipo or cool sculpting Has not watched Embrace as she's been so busy and stressed with everything else  Has been reading the information I gave last time.  Doesn't think she has low self-esteem Got her disability debt cleared!   Feels free when she eats.  Is so controlled otherwise  Wants to recover.  Knows this isn't healthy.  But is really struggling.  Is interested in [facility] group   MEDICATIONS: see list   DIETARY INTAKE:  Avoided foods include processed food, sugar, salt, fried, high fat, saturated fat, GMO         24-hr recall:  3-4 small meals Less toast  Usual physical activity: none    Nutritional Diagnosis:  NB-1.5 Disordered eating pattern As related to meal skipping and emotional eating.  As evidenced by self-report.    Intervention:  Nutrition counseling provided. Challenged diet mentality/all or nothing/eating disorder voice.  Discussed ways to challenge body image:   Please watch Embrace Check out [email or web address] Check out Underneath We are all Woman  Consider ready Embody by William Daltononnie Sobczak  Check Tess Holiday, Ashley JacobsAshley Graham, Iskra    Monitoring/Evaluation:  Dietary intaket in 1 week(s).

## 2016-10-03 ENCOUNTER — Ambulatory Visit: Payer: Medicaid Other | Admitting: *Deleted

## 2016-10-07 ENCOUNTER — Ambulatory Visit: Payer: Medicaid Other | Admitting: *Deleted

## 2016-10-21 ENCOUNTER — Encounter: Payer: Medicare Other | Attending: Pediatrics | Admitting: *Deleted

## 2016-10-21 DIAGNOSIS — F419 Anxiety disorder, unspecified: Secondary | ICD-10-CM | POA: Insufficient documentation

## 2016-10-21 DIAGNOSIS — F509 Eating disorder, unspecified: Secondary | ICD-10-CM | POA: Diagnosis present

## 2016-10-21 DIAGNOSIS — M7501 Adhesive capsulitis of right shoulder: Secondary | ICD-10-CM | POA: Insufficient documentation

## 2016-10-21 DIAGNOSIS — Z713 Dietary counseling and surveillance: Secondary | ICD-10-CM | POA: Diagnosis present

## 2016-10-21 NOTE — Progress Notes (Signed)
Medical Nutrition Therapy:  Appt start time: 1400  end time:  1500   Assessment:  Primary concerns today: she is here for follow up nutrition counseling pertaining to disordered eating and subsequent weight gain.  Is here with dyed hair, styled, jewelry, and fitted shirt.  This is a change from her normal. She does not have Lyme Disease.  Has elevated cholesterol- should she take medication?? HDL is in the 70s Also had a stress test at her cardiologist who recommended physical activity and recheck cholesterol in a year Enrolled in exercise gym at senior center and got a personal trainer who will help her with aerobics Given her chronic pain she is having a really hard time being active daily  Dieting and now can't sleep Went to free meditation class every Wednesday Frustrated with her therapist    MEDICATIONS: see list   DIETARY INTAKE:  Avoided foods include processed food, sugar, salt, fried, high fat, saturated fat, GMO         24-hr recall:  5 toasts with PB PB and jelly by the spoon avocado  Usual physical activity: none    Nutritional Diagnosis:  NB-1.5 Disordered eating pattern As related to meal skipping and emotional eating.  As evidenced by self-report.    Intervention:  Nutrition counseling provided.  Go back to 4 meals/day  Starch, protein, fat and fruit and/or veggie Try exercising 3 days/week low intensity for up to 30 minutes Consider yoga or mediation Consider Aaron MoseMelissa Carmona [phone]  Monitoring/Evaluation:  Dietary intaket in 1 week(s).

## 2016-10-21 NOTE — Patient Instructions (Signed)
Go back to 4 meals/day  Starch, protein, fat and fruit and/or veggie Try exercising 3 days/week low intensity for up to 30 minutes Consider yoga or mediation Consider Melissa CarmoAaron Mosena [phone]

## 2016-10-28 ENCOUNTER — Encounter: Payer: Medicare Other | Admitting: *Deleted

## 2016-10-28 DIAGNOSIS — F509 Eating disorder, unspecified: Secondary | ICD-10-CM | POA: Diagnosis not present

## 2016-10-28 NOTE — Patient Instructions (Signed)
[email or web address]  [email or web address]

## 2016-10-28 NOTE — Progress Notes (Signed)
Medical Nutrition Therapy:  Appt start time: 1700  end time:  1800   Assessment:  Primary concerns today: she is here for follow up nutrition counseling pertaining to disordered eating and subsequent weight gain.  Is going to the gym 3 days/week: yoga or chair yoga and 30 minutes cardio afterwards.  No weights yet. Regained weight lost for the wedding.  Very distressed.  Feels heavy     TANITA  BODY COMP RESULTS     BMI (kg/m^2) 24.4   Fat Mass (lbs) 44.4   Fat Free Mass (lbs) 93.2   Total Body Water (lbs) 64.6  %body fat 32.2       MEDICATIONS: see list   DIETARY INTAKE:  Avoided foods include processed food, sugar, salt, fried, high fat, saturated fat, GMO         24-hr recall:  Tofu and veggies twice 4 toast with PB and OJ and soy milk and banana  Usual physical activity: 3 days/week    Nutritional Diagnosis:  NB-1.5 Disordered eating pattern As related to meal skipping and emotional eating.  As evidenced by self-report.    Intervention:  Nutrition counseling provided.  Challenge diet mentality.  I think she would benefit from medication relief.  She is opposed Agreed to add 1 glass soy milk to increase intake for increased physical activity.  Gave [employer] resources in spanish  Monitoring/Evaluation:  Dietary intaket in 1 week(s).

## 2016-11-06 ENCOUNTER — Encounter: Payer: Medicare Other | Admitting: *Deleted

## 2016-11-06 DIAGNOSIS — F509 Eating disorder, unspecified: Secondary | ICD-10-CM

## 2016-11-06 NOTE — Progress Notes (Signed)
Medical Nutrition Therapy:  Appt start time: 1400  end time:  1500   Assessment:  Primary concerns today: she is here for follow up nutrition counseling pertaining to disordered eating and subsequent weight gain.  States she is very confused.  For a few days she ate a lot of sweets.  Woke up swollen.  For the last couple of days she didn't.  States she is confused about her goals.  She isn't worried about the number on the scale but how does she feel when she is larger.  Thinks her body doesn't feel good at a larger weight.  Felt heavy while doing yoga.  Also feels sluggish She is sleeping better, but feels more tired.  Feels that she is pushing a mountain when she "eats badly" but is sleeping really well.    Understands her emotional eating, but does not understand the physical changes with her eating.  She reports feeling so swollen.   Thinks she was emotionally eating more  Has been advised by cardiologist to exercise daily.  She is trying not to push herself too hard.  Goes 4-5 days/week   TANITA  BODY COMP RESULTS     BMI (kg/m^2) 24.4   Fat Mass (lbs) 44.4   Fat Free Mass (lbs) 93.2   Total Body Water (lbs) 64.6  %body fat 32.2       MEDICATIONS: see list   DIETARY INTAKE:  Avoided foods include processed food, sugar, salt, fried, high fat, saturated fat, GMO         24-hr recall:  Tofu and veggies twice 4 toast with PB and OJ and soy milk and banana  Usual physical activity: 3 days/week    Nutritional Diagnosis:  NB-1.5 Disordered eating pattern As related to meal skipping and emotional eating.  As evidenced by self-report.    Intervention:  Nutrition counseling provided.  Challenge diet mentality. 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  I advised again Aaron MoseMelissa Carmona therapy.  She says she can't afford it.  Monitoring/Evaluation:  Dietary intaket in 1 week(s).

## 2016-11-13 ENCOUNTER — Encounter: Payer: Medicare Other | Admitting: *Deleted

## 2016-11-13 DIAGNOSIS — F509 Eating disorder, unspecified: Secondary | ICD-10-CM

## 2016-11-13 NOTE — Patient Instructions (Signed)
Try Beano with high fiber foods Try Activia yogurt for probiotics Have a different fruit each day Consider omega-3 vitamin   Consider medication support

## 2016-11-13 NOTE — Progress Notes (Signed)
Medical Nutrition Therapy:  Appt start time: 1730  end time:  1800   Assessment:  Primary concerns today: she is here for follow up nutrition counseling pertaining to disordered eating and subsequent weight gain.   Is not eating much animal protein lately Is doing yoga most days Wants "substitute" for bread because she feels like she eats too much.  Diet consists solely of PB and J toast   TANITA  BODY COMP RESULTS     BMI (kg/m^2) 24.4   Fat Mass (lbs) 44.4   Fat Free Mass (lbs) 93.2   Total Body Water (lbs) 64.6  %body fat 32.2     MEDICATIONS: see list   DIETARY INTAKE:  Avoided foods include processed food, sugar, salt, fried, high fat, saturated fat, GMO         24-hr recall:  B: 2 toast with PB and J Crunchy PB and jelly on the spoon Maybe 8 more toasts with PB and J  Usual physical activity: 3 days/week    Nutritional Diagnosis:  NB-1.5 Disordered eating pattern As related to meal skipping and emotional eating.  As evidenced by self-report.    Intervention:  Nutrition counseling provided.   Try Beano with high fiber foods Try Activia yogurt for probiotics Have a different fruit each day Consider omega-3 vitamin   Consider medication support    Monitoring/Evaluation:  Dietary intaket in 1 week(s).

## 2016-11-21 ENCOUNTER — Ambulatory Visit: Payer: Medicare Other | Admitting: *Deleted

## 2016-12-16 ENCOUNTER — Telehealth: Payer: Self-pay | Admitting: Family

## 2016-12-16 NOTE — Telephone Encounter (Signed)
Patient states her boyfriend, Willow Oraommy Fulsom is a patient of Tammy SoursGreg.  States she came with him to an appointment that he had with Tammy SoursGreg.  States she really was pleased with the visit and would like to establish with Tammy SoursGreg.  Please advise.

## 2017-01-02 ENCOUNTER — Encounter: Payer: Medicare Other | Attending: Pediatrics | Admitting: *Deleted

## 2017-01-02 DIAGNOSIS — Z713 Dietary counseling and surveillance: Secondary | ICD-10-CM | POA: Insufficient documentation

## 2017-01-02 DIAGNOSIS — F419 Anxiety disorder, unspecified: Secondary | ICD-10-CM | POA: Diagnosis present

## 2017-01-02 DIAGNOSIS — M7501 Adhesive capsulitis of right shoulder: Secondary | ICD-10-CM | POA: Diagnosis not present

## 2017-01-02 DIAGNOSIS — F509 Eating disorder, unspecified: Secondary | ICD-10-CM | POA: Diagnosis present

## 2017-01-02 DIAGNOSIS — E639 Nutritional deficiency, unspecified: Secondary | ICD-10-CM

## 2017-01-02 NOTE — Progress Notes (Signed)
Medical Nutrition Therapy:  Appt start time: 1500  end time:  1600   Assessment:  Primary concerns today: she is here for follow up nutrition counseling pertaining to disordered eating and subsequent weight gain.  It's been a couple months since her last visit. Is registered for the [facility] group, but not sure if she wants to go.  Thinks she doesn't have issues with body image.  Thinks she is more concerned with how her body feels.  She seems to have forgotten all her body image issues.  She also changes her mind about beauty standards in [place]  Thinks she has lost weight unintentionally.  Has not been active because she is in so much pain.    Things are going well with therapist now.  They have a better connection.  Wants her to write down why she feels uncomfortable in her body  She is trying to get into her GYN, but hasn't been able to until next week Is struggling with changes in her body due to menopause.  She isn't sleeping well  Is eating from the [employer] and [employer] hot bar with veggies and tofu Has chipped her tooth and so eating is hard  Is so overwhelmed with her family etc   TANITA  BODY COMP RESULTS   01/02/17   BMI (kg/m^2) 24.4 23.2   Fat Mass (lbs) 44.4 46.4   Fat Free Mass (lbs) 93.2 89   Total Body Water (lbs) 64.6 61.2  %body fat 32.2 34.2     MEDICATIONS: see list   DIETARY INTAKE:  Avoided foods include processed food, sugar, salt, fried, high fat, saturated fat, GMO         24-hr recall:  Tofu with mushrooms and onions and diced sweet potato Couple toast with yogurt and olive oil  Usual physical activity: none currentely    Nutritional Diagnosis:  NB-1.5 Disordered eating pattern As related to meal skipping and emotional eating.  As evidenced by self-report.    Intervention:  Nutrition counseling provided.  Go to [facility]  Consider omega-3 vitamin   Consider medication support    Monitoring/Evaluation:  Dietary intaket in 2  week(s).

## 2017-01-02 NOTE — Patient Instructions (Signed)
.   Take daily fish oil supplements. Try to get [phone] mg DHA per day. Avoid 3-6-9 blends. [place], [employer] Made, and [employer]atur[employer] are trustworthy brands of fish oil supplements. [employer] has a vegetarian option.   Please go to [facility]

## 2017-01-12 NOTE — Telephone Encounter (Signed)
Unfortunately I am not current accepting new patients.

## 2017-01-13 NOTE — Telephone Encounter (Signed)
Left vm with Greg's response.

## 2017-01-16 ENCOUNTER — Encounter: Payer: Medicare Other | Admitting: *Deleted

## 2017-01-16 DIAGNOSIS — E639 Nutritional deficiency, unspecified: Secondary | ICD-10-CM

## 2017-01-16 DIAGNOSIS — F509 Eating disorder, unspecified: Secondary | ICD-10-CM | POA: Diagnosis not present

## 2017-01-16 NOTE — Progress Notes (Signed)
Medical Nutrition Therapy:  Appt start time: 1700  end time:  1800   Assessment:  Primary concerns today: she is here for follow up nutrition counseling pertaining to disordered eating and subsequent weight gain.  New insurance will start soon and she will get her tooth fixed.   Dermatologist fueled her fear of sugar by saying to limit it for acne Has been eating raisins soaked in gin for foot pain Started [facility] group this past week- started reading The Body Is Not An Apology and she likes it States she is losing weight unintentionally.  Dietary recall reveals 3 meals and snacks.  She is no longer binging. She is feeling physically better (which she attributes to weight loss) so she is more active and doing more yoga.  Generally she is pleased.  Denies again body image issues, but discusses at length her issues with her aging body and cellulite and beauty standards.   Lacks insight that these are body image issues.      TANITA  BODY COMP RESULTS   01/02/17   BMI (kg/m^2) 24.4 23.2   Fat Mass (lbs) 44.4 46.4   Fat Free Mass (lbs) 93.2 89   Total Body Water (lbs) 64.6 61.2  %body fat 32.2 34.2     MEDICATIONS: see list   DIETARY INTAKE:  Avoided foods include processed food, sugar, salt, fried, high fat, saturated fat, GMO         24-hr recall:  B: slice bread with greek yogurt, mint and olive oil Smoothie with banana, soy milk PB, jam, OJ L: organic tomato sauce mixed with soy milk and made a soup with yogurt D: carrots, salmon, tofu BBQ Beverages: water and coffee  Usual physical activity: yoga 3 days/week (5 classes total)    Nutritional Diagnosis:  NB-1.5 Disordered eating pattern As related to meal skipping and emotional eating.  As evidenced by self-report.    Intervention:  Nutrition counseling provided.  Challenged Teaching laboratory technicianbeauty standards.  Reiterated that all foods fit   Monitoring/Evaluation:  Dietary intaket in 2 week(s).

## 2017-01-23 ENCOUNTER — Other Ambulatory Visit: Payer: Self-pay | Admitting: Obstetrics & Gynecology

## 2017-01-23 DIAGNOSIS — N644 Mastodynia: Secondary | ICD-10-CM

## 2017-01-30 ENCOUNTER — Encounter: Payer: Medicare Other | Attending: Pediatrics | Admitting: *Deleted

## 2017-01-30 DIAGNOSIS — M7501 Adhesive capsulitis of right shoulder: Secondary | ICD-10-CM | POA: Insufficient documentation

## 2017-01-30 DIAGNOSIS — Z713 Dietary counseling and surveillance: Secondary | ICD-10-CM | POA: Insufficient documentation

## 2017-01-30 DIAGNOSIS — F509 Eating disorder, unspecified: Secondary | ICD-10-CM | POA: Diagnosis present

## 2017-01-30 DIAGNOSIS — F419 Anxiety disorder, unspecified: Secondary | ICD-10-CM | POA: Diagnosis present

## 2017-01-30 NOTE — Progress Notes (Signed)
Medical Nutrition Therapy:  Appt start time: 1100  end time:  1200   Assessment:  Primary concerns today: she is here for follow up nutrition counseling pertaining to disordered eating and subsequent weight gain.  Has been very sick.  Missed [facility] group. Hands were stiff; diarrhea, vomiting.  Thinks she had food poisoning. Has not been happy with [employer] market anymore Is still enjoying the Body Is Not An Apology   Has been doing yoga 3 days/week and meditation every day and find those things very helpful. Is trying to give herself permission.  Is trying to find the middle ground.needs to forgive herself/let go.  .  Would liek to achieve self love- feels that is going back to a "pure state"    MEDICATIONS: see list   DIETARY INTAKE:  Avoided foods include processed food, sugar, salt, fried, high fat, saturated fat, GMO         24-hr recall:  B: bread with yogurt evoo and mint L: smoothie PB and J by the spoon Pumpkin soup Raisins soaked in gin  Usual physical activity: yoga 3 days/week (5 classes total)    Nutritional Diagnosis:  NB-1.5 Disordered eating pattern As related to meal skipping and emotional eating.  As evidenced by self-report.    Intervention:  Nutrition counseling provided.  Challenged Teaching laboratory technicianbeauty standards.  Reiterated that all foods fit.  Emphasized need for 3 meals/day.  Suggested anti anxiety medication again.  She is still resistant.   Monitoring/Evaluation:  Dietary intaket in 3 week(s).

## 2017-02-20 ENCOUNTER — Encounter: Payer: Medicare Other | Attending: Pediatrics | Admitting: *Deleted

## 2017-02-20 DIAGNOSIS — F509 Eating disorder, unspecified: Secondary | ICD-10-CM | POA: Diagnosis present

## 2017-02-20 DIAGNOSIS — F419 Anxiety disorder, unspecified: Secondary | ICD-10-CM | POA: Insufficient documentation

## 2017-02-20 DIAGNOSIS — E639 Nutritional deficiency, unspecified: Secondary | ICD-10-CM

## 2017-02-20 DIAGNOSIS — Z713 Dietary counseling and surveillance: Secondary | ICD-10-CM | POA: Diagnosis present

## 2017-02-20 DIAGNOSIS — M7501 Adhesive capsulitis of right shoulder: Secondary | ICD-10-CM | POA: Diagnosis not present

## 2017-02-20 NOTE — Progress Notes (Signed)
Medical Nutrition Therapy:  Appt start time: 1400  end time:  1500   Assessment:  Primary concerns today: she is here for follow up nutrition counseling pertaining to disordered eating and subsequent weight gain.  Group has been very difficult for her social anxiety.  doesn't eat beforehand and then binges afterwards.  Was wanting to quit.  Isn't getting anything out of going because she is so in her head.  She does enjoy the homework and the reading though  Has been doing yoga and some meditation..  Still opposed to medication for anxiety/OCD Has been trying to Embrace (watched the movie) is relearning what she has learned in yoga to accept her body and listen to her body  Pain is still very severe   MEDICATIONS: see list   DIETARY INTAKE:  Avoided foods include processed food, sugar, salt, fried, high fat, saturated fat, GMO           Usual physical activity: yoga 3 days/week (5 classes total)    Nutritional Diagnosis:  NB-1.5 Disordered eating pattern As related to meal skipping and emotional eating.  As evidenced by self-report.    Intervention:  Nutrition counseling provided.   Challenged cognitive distortions.  Discussed meds  Monitoring/Evaluation:  Dietary intaket in 2 week(s).

## 2017-02-27 ENCOUNTER — Other Ambulatory Visit: Payer: Medicare Other

## 2017-03-06 ENCOUNTER — Ambulatory Visit
Admission: RE | Admit: 2017-03-06 | Discharge: 2017-03-06 | Disposition: A | Discharge status: Home / Self Care | Payer: Medicare Other | Source: Ambulatory Visit | Attending: Obstetrics & Gynecology | Admitting: Obstetrics & Gynecology

## 2017-03-06 ENCOUNTER — Ambulatory Visit: Payer: Medicare Other | Admitting: *Deleted

## 2017-03-06 DIAGNOSIS — N644 Mastodynia: Secondary | ICD-10-CM

## 2017-03-20 ENCOUNTER — Encounter: Payer: Medicare Other | Attending: Pediatrics | Admitting: *Deleted

## 2017-03-20 DIAGNOSIS — F509 Eating disorder, unspecified: Secondary | ICD-10-CM | POA: Insufficient documentation

## 2017-03-20 DIAGNOSIS — Z713 Dietary counseling and surveillance: Secondary | ICD-10-CM | POA: Diagnosis present

## 2017-03-20 DIAGNOSIS — M7501 Adhesive capsulitis of right shoulder: Secondary | ICD-10-CM | POA: Diagnosis not present

## 2017-03-20 DIAGNOSIS — F419 Anxiety disorder, unspecified: Secondary | ICD-10-CM | POA: Insufficient documentation

## 2017-03-20 NOTE — Progress Notes (Signed)
Medical Nutrition Therapy:  Appt start time: 1400  end time:  1500   Assessment:  Primary concerns today: she is here for follow up nutrition counseling pertaining to disordered eating and subsequent weight gain.   In a lot of pain.  Hips are locked and it's difficult to walk.  It not able to do yoga. Is trying to take it easy and listen to her body Did not finish The Body Is Not An Apology.  Liked the concept, but didn't feel like she needs to finish it.  Lost some weight unintentionally and then gained it back unintentionally.  Feels that she reacts to food very strongly and gets very bloated and wants to see a GI  Feels like she is back to where she started with pain and food and is frustrated  Is increasingly anxious due to the political climate Is going to go talk to psychiatrist.  Not sure she wants meds, but wants to ask questions.  Is very concerned about addiction and side effects.  Wonders if there is something she could do   MEDICATIONS: see list   DIETARY INTAKE: 3-4 meals Avoided foods include processed food, sugar, salt, fried, high fat, saturated fat, GMO         Toast with PB and OJ Smoothie with banana and soy milk and PB and OJ Ruby Tuesday twice salad bar with hummus  Usual physical activity: none     Nutritional Diagnosis:  NB-1.5 Disordered eating pattern As related to meal skipping and emotional eating.  As evidenced by self-report.    Intervention:  Nutrition counseling provided.   Challenged cognitive distortions.  Discussed meds She wants to try Silver Sneakers.  Is worried about her cholesterol  Monitoring/Evaluation:  Dietary intaket in 4 week(s).

## 2017-04-17 ENCOUNTER — Encounter: Payer: Medicare Other | Admitting: *Deleted

## 2017-04-17 DIAGNOSIS — E639 Nutritional deficiency, unspecified: Secondary | ICD-10-CM

## 2017-04-17 DIAGNOSIS — F509 Eating disorder, unspecified: Secondary | ICD-10-CM | POA: Diagnosis not present

## 2017-04-17 NOTE — Progress Notes (Signed)
Medical Nutrition Therapy:  Appt start time: 1130  end time:  1230   Assessment:  Primary concerns today: she is here for follow up nutrition counseling pertaining to disordered eating and subsequent weight gain.  Has appt with psychiatrist tomorrow.  Hasn't seen therapist in a while due to insurance snaffu   In a lot of pain.  Taking a lot of asprin  MEDICATIONS: see list   DIETARY INTAKE: 3-4 meals Avoided foods include processed food, sugar, salt, fried, high fat, saturated fat, GMO         2 toast with PB and OJ 2 RX bars Guacamole with tortilla chips Malawiurkey and olive oil  Usual physical activity: none     Nutritional Diagnosis:  NB-1.5 Disordered eating pattern As related to meal skipping and emotional eating.  As evidenced by self-report.    Intervention:  Nutrition counseling provided.   Challenged cognitive distortions.  Ca/vit D and fish oil.  Please get tooth fixed.  Eat a variety.  More plants    Monitoring/Evaluation:  Dietary intaket prn.

## 2017-07-15 ENCOUNTER — Encounter: Payer: Self-pay | Admitting: Family Medicine

## 2017-07-15 ENCOUNTER — Ambulatory Visit (INDEPENDENT_AMBULATORY_CARE_PROVIDER_SITE_OTHER): Payer: 59 | Admitting: Family Medicine

## 2017-07-15 DIAGNOSIS — E7800 Pure hypercholesterolemia, unspecified: Secondary | ICD-10-CM | POA: Diagnosis not present

## 2017-07-15 DIAGNOSIS — F509 Eating disorder, unspecified: Secondary | ICD-10-CM | POA: Diagnosis not present

## 2017-07-15 NOTE — Progress Notes (Signed)
Medical Nutrition Therapy:  Appt start time: 1330 end time:  1430. Therapist Katherine Cruz, Katherine Cruz; [facility] (Fax [phone]) Med's Management Katherine Cruz, APMHNP-BC  Assessment:  Primary concerns today: eating d/o evaluation.  BLIND WEIGHT TAKEN TODAY.   Katherine Cruz has a complicated medical history.  Dx's include anxiety, fibromyalgia, osteoarthritis, mitral valve prolapse, GI issues (food sensitivities?), OCD, PTSD, and recently, depression.  She is averse to taking any medications, but she dd recently agree to at least consult with Katherine Cruz at [facility].    Everything feels difficult to her now, even showering, and getting ready for her day.  She doesn't feel well; hurts all the time.  She is bothered by her current weight and elevated cholesterol.  HDL is very high, but sometime in the past year, her cardiologist had advised her LDL is also high, and that she needs to increase her fitness level, based on a TM stress test.  For a while, she was doing yoga at [facility], but has been unable to maintain consistency in this.     Katherine Cruz saw RD Katherine Cruz in 2017-2018 for MNT.  She had a body comp analysis via Katherine Cruz, but did not get results of that analysis.  Katherine Cruz sounded skeptical of Katherine Cruz assessment that she had some "body image issues," but she admitted that growing up in [place], there was always a lot of pressure for body perfection, and she also mentioned during discussion of possible types of exercise for her that she would not do any water exercise unless she could cover up all of her legs.  Katherine Cruz is convinced that she has multiple and severe "fat pockets" that she would be ashamed for others to see.    Katherine Cruz used to "OD" on peanut butter, but has discontinued this practice for the past two weeks.  Katherine Cruz and her boyfriend eat out a lot, including buffet meals.  She is concerned that portion control is an issue  for her.    Learning Readiness: Ready  Usual eating pattern includes erratic schedule; tends toward emotional eating.   Frequent foods and beverages include coffee with soy milk, water, toast, yogurt, mint, and olive oil, homemade soups, frozen yogurt.  Avoided foods include "scary foods" like donuts, Twinkies, white flour products, fried foods, fast foods, seldom drinks water.   Usual physical activity includes meditation group 1 X wk, and no other consistent exercise.  Hopes to start back with yoga at the [facility].  Usual sleep pattern midnight to 9 AM.  Katherine Cruz feels she gets adequate and good quality sleep.    24-hr recall: (Up at 9:30 AM) B (11 AM)-   2 toast, 6 tbsp yogurt, mint, and olive oil, 1 c coffee, 2 tbsp soy milk Snk (12 AM)-   1  c coffee, 2 tbsp soy milk L (2:30 PM)-  4 c frozen yogurt Snk ( PM)-  ??  frozen yogurt, 1  c coffee, 2 tbsp soy milk  ?? D (7 PM)-  4 c squash & soy milk soup, 1/2 c plain [place] yogurt Snk ( PM)-  --- Typical day? Yes.    Progress Towards Goal(s):  In progress.   Nutritional Diagnosis:  NB-1.5 Disordered eating pattern As related to food and weight anxiety.  As evidenced by erratic eating pattern with no distinguishable meals and absence of nutritional balance.    Intervention:  Nutrition education  Handouts given during visit include:  After-Visit Summary (AVS)  Meal Planning Form  Demonstrated degree of understanding via:  Teach Back  Barriers to learning/adherence to lifestyle change: Longstanding food and weight anxiety  Monitoring/Evaluation:  Dietary intake, exercise, and body weight in 5 week(s).  No appts available before this.

## 2017-07-15 NOTE — Patient Instructions (Addendum)
-   Eat at least 3 REAL meals and 1-2 snacks per day.  Aim for no more than 5 hours between eating.  Eat breakfast within one hour of getting up.  A REAL meal includes at least some protein, some starch, and vegetables and/or fruit.   The GUEST TEST: Would you serve this to a guest in your home, and call it a meal? A key to making this work for you is to devise a list of go-to meals.  Use the meal planning form provided today.    - With respect to your fibromyalgia: Consistent appropriate exercise should be helpful (for example, gentle yoga, walking, water exercise).  In addition, a high-sugar diet is inflammatory.    - Supplements:  [facility] Sen[facility]; Calcium citrate 500 0r 600 mg 1-2 X day; vitamin D 1000 IU 1 X day.

## 2017-08-11 ENCOUNTER — Ambulatory Visit: Payer: 59 | Admitting: Family Medicine

## 2017-08-19 ENCOUNTER — Ambulatory Visit: Payer: 59 | Admitting: Family Medicine

## 2017-08-25 ENCOUNTER — Encounter: Payer: Self-pay | Admitting: Family Medicine

## 2017-08-25 ENCOUNTER — Ambulatory Visit (INDEPENDENT_AMBULATORY_CARE_PROVIDER_SITE_OTHER): Payer: 59 | Admitting: Family Medicine

## 2017-08-25 DIAGNOSIS — F509 Eating disorder, unspecified: Secondary | ICD-10-CM | POA: Diagnosis not present

## 2017-08-25 NOTE — Progress Notes (Signed)
Medical Nutrition Therapy:  Appt start time: 1330 end time:  1430. Therapist Creig HinesAnu Parvathaneni, LPCA, [facility]; [facility] (Fax [phone]) Med's Management Ellis SavageLisa Poulos, APMHNP-BC  Assessment:  Primary concerns today: eating d/o evaluation.   Weight (135.2 lb)  was not done blindly today; Katherine Cruz said she'd seen her weight last week.  She was sure she had lost weight today.    She has been getting 3 meals and 1-2 snacks daily, a big change from previously.  She has been hungry sometimes at the end of meals, but resists eating more.    Katherine Cruz has joined the hiking club through the [facility] as well as two [employer] clubs.  She feels a lot better, and speculates that some of this is related to having lost a bit of weight, which remains important to her.      Recent physical activity has included hiking a couple of times with the hiking club; yoga twice.  Still has pain related to fibromyalgia, and often does not want to exercise, but tries to overcome this feeling by forcing herself to move.    Sleep has not been good; going to bed around 2 or 2:30 AM, not feeling sleepy.  She has no trouble falling asleep, but wakes up at 8:30 AM at the latest.    24-hr recall:  (Up at 8 AM) B (8:30 AM)-  1 toast, 2 tbsp olive oil, 2/4 c plain Grk yogurt, mint, 1 tbsp all-fruit spread, coffee, soy milk B (9:15 AM)-  2 scrmbld eggs at [employer], coffee Snk (11 AM)-  1 Lara Bar L (2 PM)-  1 Ruby Tues salad, plain Grk yogurt, 3 tbsp olive oil Snk (4 PM)-  1 [employer]ara Bar, coffee, soy milk D (8 PM)-  1 Ruby Tues salad, plain Grk yogurt, 3 tbsp olive oil Snk ( PM)-  --- Typical day? Yes.    Progress Towards Goal(s):  In progress.   Nutritional Diagnosis:  Progress noted on NB-1.5 Disordered eating pattern As related to food and weight anxiety.  As evidenced by daily intake of 3 meals and 1-2 snacks.    Intervention:  Nutrition education  Handouts given during visit  include:   After-Visit Summary (AVS)  Meal Planning Form  Demonstrated degree of understanding via:  Teach Back  Barriers to learning/adherence to lifestyle change: Longstanding food and weight anxiety  Monitoring/Evaluation:  Dietary intake, exercise, and body weight in 5 week(s).  No appts available before this.

## 2017-08-25 NOTE — Patient Instructions (Addendum)
-   We tend to adapt to whatever eating (and sleep) schedule we impose on ourselves.    - Great job on the regularity of meals and snacks.  Continue this pattern.  - Continue to get physical activity, aiming for at least 4 times a week, emphasizing gentle exercise and outside.  (Consider the many benefits of walking with a friend.)  - If you feel you sleep better with an evening snack, feel free to do so.  Make it relatively light, such as yogurt and fruit or a slice of toast with yogurt (and a small amt olive oil).     - Drink at least 48 oz of water daily.    - Set your phone alarm as a reminder.  - Work on getting more sleep:  Move bedtime up to no later than 1:30 AM.  Stick with this bedtime for a week, then move to 1:45 AM, and moving it earlier by 15 minutes each subsequent week.    - Set your phon alarm to let you know when you need to start your bedtime routine.  - No screens for at least one hour before bed.    - Determine a way of tracking your water intake and bedtime/hours of sleep.  Bring your documentation to next appt.

## 2017-09-15 ENCOUNTER — Ambulatory Visit: Payer: 59 | Admitting: Family Medicine

## 2017-09-25 ENCOUNTER — Ambulatory Visit: Payer: 59 | Admitting: Family Medicine

## 2017-11-13 ENCOUNTER — Ambulatory Visit: Payer: 59 | Admitting: Family Medicine

## 2017-12-16 ENCOUNTER — Ambulatory Visit (INDEPENDENT_AMBULATORY_CARE_PROVIDER_SITE_OTHER): Payer: 59 | Admitting: Family Medicine

## 2017-12-16 ENCOUNTER — Encounter: Payer: Self-pay | Admitting: Family Medicine

## 2017-12-16 DIAGNOSIS — F5000 Anorexia nervosa, unspecified: Secondary | ICD-10-CM

## 2017-12-16 NOTE — Progress Notes (Signed)
Medical Nutrition Therapy:  Appt start time: 1330 end time:  1430. Therapist Creig HinesAnu Parvathaneni, LPCA, [facility]; [facility] (Fax [phone]) Med's Management Ellis SavageLisa Poulos, APMHNP-BC Cardiologist Yates DecampJay Ganji, MD  Assessment:  Primary concerns today: eating d/o evaluation.   Weight 126.4lb, down almost 9 lb since April.  [name] insists that she is eating to satisfaction, and that eating three full meals per day just feels like too much for her.      Usual eating pattern 2 real meals and 2 snacks daily.  Usually protein bars for snacks.  Occasionally has fruit following meal.   Recent physical activity has included chair yoga followed by regular yoga (each 45 min) 2 X wk.  [name] tried a session of [facility], but she had a lot of heart arrhithmias during it, so she saw her cardiologist.  Will try again with a Holter monitor at Dr. Verl DickerGanji's recommendation.  Still has pain related to fibromyalgia, and often does not want to exercise, but tries to overcome this feeling by forcing herself to move.    Lipid panel June 25 revealed that TC went down from 250 to 210:  TC 210 TG 56 HDL 64 LDL 135  [name] had a TM stress test in 2018, which, like previous ones, showed a poor fitness level.  She believes her CV fitness is still poor, but better than before.    Sleep has been very bad recently.  Getting to bed at around 1 AM, but awake till ~3 AM while she reads and talks on her phone.  She then gets up no later than 8 to get to [facility] 2-3 days a week.  Latest she sleeps is 10:30 AM on non-[facility] days.    24-hr recall:  (Up at 10:30 AM) B (11:30 AM)-  2 slc toast, 1 c guacamole, coffee, 1 tbsp soy milk Snk ( AM)-  --- L (1 PM)-  1 protein bar (210 kcal), coffee,  Snk (4 PM)-  1 [employer]Lara Bar, coffee Snk (7:30 PM)- 1 c coffee, 1 tbsp soy milk D (8 PM)-  1 large Ruby Tuesday salad, 2 tbsp sunflwr sds, 1 tbsp cranberries, 1 c plain Grk f-f yogurt, 3  tbsp olive oil  Snk (11 PM)-  5-6 chunk watermelon Typical day? Yes.  Except usually has toast with yogurt for bkfst.    Progress Towards Goal(s):  In progress.   Nutritional Diagnosis:  Progress noted on NB-1.5 Disordered eating pattern As related to food and weight anxiety.  As evidenced by daily intake of 3 meals and 1-2 snacks.    Intervention:  Nutrition education  Handouts given during visit include:   After-Visit Summary (AVS)  Demonstrated degree of understanding via:  Teach Back  Barriers to learning/adherence to lifestyle change: Longstanding food and weight anxiety  Monitoring/Evaluation:  Dietary intake, exercise, and body weight in 3 week(s).

## 2017-12-16 NOTE — Patient Instructions (Addendum)
-   I cannot emphasize enough the value of SLEEP.  This is extremely relevant to both your anxiety  And your fibro.    Cholesterol:  - You may get some benefit from increasing soluble fiber in your diet.  This is in beans, as well as most fruits and veg's.   - Keep in mind that when you eat only one lunch or dinner (instead of both) daily, you are eliminating one opportunity for veg's.   - Continue to use mostly extra-virgin olive oil, which will be best for your cholesterol.   - Consider that cholesterol can respond to stress.  And we also know that sleep deprivation is a stressor.    Specific Goals: - If beans are not well tolerated, experiment with adding small amounts.  Okra is also a good source of soluble fiber.  (Okra, tomato, and onion salad:  Slice all veg's, and toss with a balsamic vinaigrette dressing.)  - Substitute fresh fruit (with or without yogurt) for one of your protein bars.    - Try some plain tofu on your salads with or without yogurt.    - Because you have not had any tests in a while, and b/c you are so tired every day, it is a good idea to get some lab tests to look at your iron levels as well as electrolytes (in light of history of low potassium) and vitamin D (in light of low bone density).   - You need a PCP:  Deberah Peltoneb Schoenhoff, MD is with [facility].

## 2018-01-02 ENCOUNTER — Ambulatory Visit (INDEPENDENT_AMBULATORY_CARE_PROVIDER_SITE_OTHER): Payer: 59 | Admitting: Pulmonary Disease

## 2018-01-02 ENCOUNTER — Encounter: Payer: Self-pay | Admitting: Pulmonary Disease

## 2018-01-02 ENCOUNTER — Other Ambulatory Visit (INDEPENDENT_AMBULATORY_CARE_PROVIDER_SITE_OTHER): Payer: 59

## 2018-01-02 ENCOUNTER — Ambulatory Visit (INDEPENDENT_AMBULATORY_CARE_PROVIDER_SITE_OTHER)
Admission: RE | Admit: 2018-01-02 | Discharge: 2018-01-02 | Disposition: A | Discharge status: Home / Self Care | Payer: 59 | Source: Ambulatory Visit | Attending: Pulmonary Disease | Admitting: Pulmonary Disease

## 2018-01-02 VITALS — BP 134/82 | HR 88 | Ht 64.0 in | Wt 126.0 lb

## 2018-01-02 DIAGNOSIS — R0602 Shortness of breath: Secondary | ICD-10-CM

## 2018-01-02 LAB — CBC WITH DIFFERENTIAL/PLATELET
BASOS PCT: 0.6 % (ref 0.0–3.0)
Basophils Absolute: 0 10*3/uL (ref 0.0–0.1)
EOS ABS: 0.1 10*3/uL (ref 0.0–0.7)
EOS PCT: 1.9 % (ref 0.0–5.0)
HCT: 41.1 % (ref 36.0–46.0)
Hemoglobin: 13.5 g/dL (ref 12.0–15.0)
LYMPHS ABS: 1.7 10*3/uL (ref 0.7–4.0)
Lymphocytes Relative: 37.6 % (ref 12.0–46.0)
MCHC: 32.9 g/dL (ref 30.0–36.0)
MCV: 90.2 fl (ref 78.0–100.0)
Monocytes Absolute: 0.3 10*3/uL (ref 0.1–1.0)
Monocytes Relative: 6.5 % (ref 3.0–12.0)
NEUTROS ABS: 2.4 10*3/uL (ref 1.4–7.7)
Neutrophils Relative %: 53.4 % (ref 43.0–77.0)
PLATELETS: 214 10*3/uL (ref 150.0–400.0)
RBC: 4.55 Mil/uL (ref 3.87–5.11)
RDW: 14.2 % (ref 11.5–15.5)
WBC: 4.6 10*3/uL (ref 4.0–10.5)

## 2018-01-02 NOTE — Patient Instructions (Addendum)
We will get an x-ray today Check CBC differential, blood allergy profile and schedule you for pulmonary function test  Follow-up in 1 month to review results and plan next steps.
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
## 2018-01-05 LAB — RESPIRATORY ALLERGY PROFILE REGION II ~~LOC~~
Allergen, A. alternata, m6: <0.1 kU/L
Allergen, Cedar tree, t12: <0.1 kU/L
Allergen, D pternoyssinus,d7: 0.15 kU/L — ABNORMAL HIGH
Allergen, Mouse Urine Protein, e78: <0.1 kU/L
Allergen, Mulberry, t76: <0.1 kU/L
Allergen, P. notatum, m1: 0.16 kU/L — ABNORMAL HIGH
Aspergillus fumigatus, m3: <0.1 kU/L
CLADOSPORIUM HERBARUM (M2) IGE: 0.16 kU/L — AB
CLASS: 0
CLASS: 0
CLASS: 0
CLASS: 0
CLASS: 0
CLASS: 0
CLASS: 0
CLASS: 0
CLASS: 0
CLASS: 1
Cat Dander: <0.1 kU/L
Class: 0
Class: 0
Class: 0
Class: 0
Class: 0
Class: 0
Class: 0
Class: 0
Class: 0
Class: 0
Class: 0
Class: 0
Class: 0
Class: 1
D. farinae: 0.63 kU/L — ABNORMAL HIGH
Dog Dander: 0.39 kU/L — ABNORMAL HIGH
IgE (Immunoglobulin E), Serum: 131 kU/L — ABNORMAL HIGH (ref <=114)
Johnson Grass: <0.1 kU/L
Rough Pigweed  IgE: <0.1 kU/L
Timothy Grass: <0.1 kU/L

## 2018-01-05 LAB — INTERPRETATION:

## 2018-01-07 ENCOUNTER — Telehealth: Payer: Self-pay | Admitting: Pulmonary Disease

## 2018-01-07 DIAGNOSIS — R911 Solitary pulmonary nodule: Secondary | ICD-10-CM

## 2018-01-07 NOTE — Telephone Encounter (Signed)
Advised pt of results. Pt understood and nothing further is needed.   CT without chest ordered.    Notes recorded by Chilton GreathouseMannam, Praveen, MD on 01/06/2018 at 4:04 PM EDT Please let patient know that chest x-ray shows a possible lung nodule in the left. We will get a CT chest without contrast for further evaluation  In addition labs show mild allergies to dust mite, mold and dog.

## 2018-01-08 ENCOUNTER — Ambulatory Visit: Payer: 59 | Admitting: Family Medicine

## 2018-01-12 ENCOUNTER — Telehealth: Payer: Self-pay | Admitting: Pulmonary Disease

## 2018-01-12 NOTE — Telephone Encounter (Signed)
Called patient, unable to reach left message to give us a call back.   Almyra FreeLibby, would you mind looking into this please. Thank you.

## 2018-01-13 NOTE — Telephone Encounter (Signed)
I have pulled info and will ck with [facility] Tobe SosSally E Ottinger

## 2018-01-14 NOTE — Telephone Encounter (Signed)
Spoke to [facility] this pft does not need auth pt is aware and will go ahead and do this pft Tobe SosSally E Ottinger

## 2018-01-15 ENCOUNTER — Ambulatory Visit (INDEPENDENT_AMBULATORY_CARE_PROVIDER_SITE_OTHER): Payer: 59 | Admitting: Family Medicine

## 2018-01-15 ENCOUNTER — Encounter: Payer: Self-pay | Admitting: Family Medicine

## 2018-01-15 DIAGNOSIS — F5000 Anorexia nervosa, unspecified: Secondary | ICD-10-CM

## 2018-01-15 NOTE — Patient Instructions (Addendum)
-   Call [facility] to ask if you can establish with Dr. Terisa Starrarina Brown as your primary provider:  [phone].   - When we are under stress, it's the time when we most need to take care of ourselves, even though it's the hardest to do so at these times.    Specific Behavioral Change gOALS: 1. Physical activity: Chair yoga 2 X wk and regular yoga 2 X wk.  Join the [facility] hike every Wednesday.   2. Meditate at least 15 min 3 X wk.    - Recruit your boyfriend to remind you and encourage you.    - Use a guided meditation.   3. Put the phone away at least an hour before you go to bed at least 3 X wk.   - DOCUMENT your progress on the above goals every day.    - REMEMBER THAT PERFECT IS THE ENEMY OF GOOD.   - Go to [email or web address], and take the Four Tendencies quiz.    - Look through the book You Are Not Your Brain.  AT YOUR NEXT NUTRITION APPT WE WILL TALK ABOUT "THE URGE PROCESS" FOR MANAGING DERAILING NEGATIVE THOUGHTS THAT INFLUENCE YOUR EATING BEHAVIORS.

## 2018-01-15 NOTE — Progress Notes (Signed)
Medical Nutrition Therapy:  Appt start time: 1330 end time:  1430. Therapist Katherine HinesAnu Cruz, LPCA, [facility]; [facility] (Fax [phone]) Med's Management Katherine SavageLisa Cruz, APMHNP-BC Pulmonologist Katherine GreathousePraveen Mannam, MD Cardiologist Katherine DecampJay Ganji, MD  Assessment:  Primary concerns today: eating d/o evaluation.  Pulmonologist Dr. Isaiah SergeMannam discovered a nodule on Katherine Cruz's lung, for which she is going to be further evaluated (CT scan).  Thus has her feeling nervous, adding to a variety of stressors in her life currently.    We talked today about Katherine Cruz's inability to make intended and desired changes in diet, sleep, and physical activity.  Learning to use the "Urge" process of managing negative thoughts may be especially helpful in light of her obsessive tendencies.  Will plan to follow up on this at next appt.    Behavioral Goals Katherine Cruz said she has not made any meaningful dietary changes in the past 3 weeks.  Her sleep has not improved, and she has not managed to not use her phone at bedtime.  Weight 127.2lb, up 0.8 lb since 8/16.   Usual eating pattern 2 real meals and 2 snacks daily.  Still usually eating protein bars for snacks.   Eating [facility] from [facility] about 6 meals a week.   Recent physical activity has been less in past two weeks.  Fibro symptoms have prevented her from going as often.  [facility], but hasn't been motivated to go back.  She will be going to yoga today following her nutrition appt.  Sleep continues to be very bad  Lipid panel 10/2517:  TC 210 TG 56 HDL 64 LDL 135  Progress Towards Goal(s):  In progress.   Nutritional Diagnosis:  No progress noted on NB-1.5 Disordered eating pattern As related to food and weight anxiety.  As evidenced by reversion to usual eating pattern of 2 meals a day and highly repetitive food choices.    Intervention:  Nutrition counseling  Handouts given during visit  include:   After-Visit Summary (AVS)  Demonstrated degree of understanding via:  Teach Back  Barriers to learning/adherence to lifestyle change: Longstanding food and weight anxiety  Monitoring/Evaluation:  Dietary intake, exercise, and body weight in 4 week(s).

## 2018-01-16 ENCOUNTER — Ambulatory Visit (INDEPENDENT_AMBULATORY_CARE_PROVIDER_SITE_OTHER)
Admission: RE | Admit: 2018-01-16 | Discharge: 2018-01-16 | Disposition: A | Discharge status: Home / Self Care | Payer: 59 | Source: Ambulatory Visit | Attending: Pulmonary Disease | Admitting: Pulmonary Disease

## 2018-01-16 DIAGNOSIS — R911 Solitary pulmonary nodule: Secondary | ICD-10-CM

## 2018-02-02 ENCOUNTER — Ambulatory Visit (INDEPENDENT_AMBULATORY_CARE_PROVIDER_SITE_OTHER): Payer: 59 | Admitting: Pulmonary Disease

## 2018-02-02 ENCOUNTER — Encounter: Payer: Self-pay | Admitting: Pulmonary Disease

## 2018-02-02 VITALS — BP 130/78 | HR 74 | Ht 63.0 in | Wt 123.0 lb

## 2018-02-02 DIAGNOSIS — R0602 Shortness of breath: Secondary | ICD-10-CM

## 2018-02-02 DIAGNOSIS — Z23 Encounter for immunization: Secondary | ICD-10-CM

## 2018-02-02 LAB — PULMONARY FUNCTION TEST
DL/VA % PRED: 108 %
DL/VA: 5.13 ml/min/mmHg/L
DLCO COR: 26.91 ml/min/mmHg
DLCO cor % pred: 114 %
DLCO unc % pred: 114 %
DLCO unc: 26.99 ml/min/mmHg
FEF 25-75 PRE: 3.38 L/s
FEF2575-%PRED-PRE: 129 %
FEV1-%Pred-Pre: 111 %
FEV1-Pre: 3 L
FEV1FVC-%PRED-PRE: 105 %
FEV6-%PRED-PRE: 108 %
FEV6-PRE: 3.59 L
FEV6FVC-%Pred-Pre: 102 %
FVC-%PRED-PRE: 105 %
FVC-PRE: 3.61 L
PRE FEV6/FVC RATIO: 99 %
Pre FEV1/FVC ratio: 83 %
RV % pred: 93 %
RV: 1.72 L
TLC % pred: 105 %
TLC: 5.27 L

## 2018-02-02 LAB — NITRIC OXIDE: NITRIC OXIDE: 15

## 2018-02-02 NOTE — Patient Instructions (Addendum)
I have reviewed the PFTs that show normal lung function which is good news There is evidence of mild emphysema on CT scan but it is not affecting how your lung is working  I do not believe you will need any inhalers as there is no evidence of COPD Continue to work on your exercise regimen, maintain clean environment, use deep breathing and meditation techniques to help with anxiety Follow-up as needed.  Please call us call if there is any worsening of your symptoms.

## 2018-02-02 NOTE — Progress Notes (Signed)
PFT done today. 
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
## 2018-02-12 ENCOUNTER — Ambulatory Visit (INDEPENDENT_AMBULATORY_CARE_PROVIDER_SITE_OTHER): Payer: 59 | Admitting: Family Medicine

## 2018-02-12 ENCOUNTER — Encounter: Payer: Self-pay | Admitting: Family Medicine

## 2018-02-12 DIAGNOSIS — F5001 Anorexia nervosa, restricting type: Secondary | ICD-10-CM | POA: Diagnosis not present

## 2018-02-12 NOTE — Progress Notes (Signed)
Medical Nutrition Therapy:  Appt start time: 1100 end time:  1200. Therapist Creig Hines, [facility]; [facility] (Fax [phone]) Med's Management [name], APMHNP-BC Pulmonologist Chilton Greathouse, MD Cardiologist Yates Decamp, MD  Assessment:  Primary concerns today: restrictive eating (anorexia nervosa).  Katherine Cruz's boyfriend has had some medical issues that required recent hospitalization, which has been stressful for them.  In addition, she has been diagnosed with mild emphysema.  She still has good lung function, and no evidence of COPD.  The pulmonologist believes her SOB may be related to anxiety and/or allergies.  She has been told she is allergic to dogs, dust, and mold (via blood test).  Katherine Cruz has been playing ukelele, following lessons from [employer] at the [employer].  She will be participating in a weekly class starting next week.  Also plans to join an adult [place] dancing group that will meet on Thursdays 8-10 PM beginning in October.  She has also started a drawing class through the [facility].    Behavioral Goals Has not been able to focus on goals recently with all medical concerns going on.  Katherine Cruz has not been meditating daily, but practicing more art has been very helpful in calming anxiety.  The meditation group she has been in previously will be restarting in October.   Weight 124.4lb, stable.   Usual eating pattern 1-2 real meals and 1-2 snacks daily.   Recent physical activity:  In past two weeks, has restarted walking with her hiking group and doing twice weekly yoga at the [facility].   Sleep continues to be poor.   24-hr recall suggests intake of ~1580 kcal:  (Up at 7 AM) B (8 AM)-   1 [employer], 1 c blk coffee   190 Hiked >7 miles; drank water Snk ( AM)-  --- L (3:30)-   1 Rx Protein bar    210 kcal  Snk (5 PM)-  1 [employer]    190 D (8 PM)-   4 c rsted veg's, 1 c f-f Grk yog, 3 tbsp olive oil 750 Snk  ( PM)-  1 c blk coffee, 2 toast, 2 tbsp fruit spread  240 Typical day? Yes.    Progress Towards Goal(s):  In progress.   Nutritional Diagnosis:  No progress noted on NB-1.5 Disordered eating pattern As related to food and weight anxiety.  As evidenced by usual eating pattern of 1-2 meals a day and highly repetitive food choices.    Intervention:  Nutrition counseling  Handouts given during visit include:   After-Visit Summary (AVS)  pdf file of Urge Process  Demonstrated degree of understanding via:  Teach Back  Barriers to learning/adherence to lifestyle change: Longstanding food and weight anxiety  Monitoring/Evaluation:  Dietary intake, exercise, and body weight in 4 week(s).

## 2018-02-12 NOTE — Patient Instructions (Addendum)
-   Review the pdf file emailed today about managing negative derailing thoughts.   - [id number] four-step process of challenging invasive thoughts:  (1) Name it; (2) Frame it; (3) Envision; and (4) Intentional focus shifting.  You may find it even more helpful if you write down your responses.    - As you first start using this process, you may find it impossible to use in real time.  Use the process instead retrospectively.    - Go to self-compassion.org, and take the quiz on self-compassion, and email jeannie your overall score and 6 sub-scores, which we can talk about at follow-up.    Food-related goals: 1. Eat at least 2 real meals and a couple of snacks.   2. Spend at least a few minutes once a week to plan ahead for meals and snacks that week.  Shop accordingly.   - You can easily keep on hand some no-salt-added canned beans.    - Having the foods available will often make the difference between getting the meals or not.

## 2018-03-16 ENCOUNTER — Ambulatory Visit: Payer: 59 | Admitting: Family Medicine

## 2018-06-25 ENCOUNTER — Encounter: Payer: Self-pay | Admitting: Family Medicine

## 2018-06-25 ENCOUNTER — Ambulatory Visit (INDEPENDENT_AMBULATORY_CARE_PROVIDER_SITE_OTHER): Payer: 59 | Admitting: Family Medicine

## 2018-06-25 DIAGNOSIS — F5001 Anorexia nervosa, restricting type: Secondary | ICD-10-CM

## 2018-06-25 NOTE — Progress Notes (Addendum)
Medical Nutrition Therapy:  Appt start time: 1100 end time:  1200. Therapist Creig Hines, [facility]; [facility] (Fax [phone]) Med's Management [name], APMHNP-BC Pulmonologist Chilton Greathouse, MD Cardiologist Yates Decamp, MD  Assessment:  Primary concerns today: restrictive eating (anorexia nervosa).  Katherine Cruz has been depressed recently.  She has been isolating, and feels no motivation to do "things I need to do."  She has restarted with her hiking club, however, and she is making an effort.    Recent labs showed that Katherine Cruz's total cholesterol has gone up to 220.  It had previously dropped from 250 to 210.  TG were in normal range.  She has been distressed about her cholesterol level, and is very resistant to starting a statin.    Labs 10/18/17 04/24/18 TC  210 220 TG    56   75 LDL 135 136 VLDL   11   15 HDL   64   69  Chronic pain from fibromyalgia is especially challenging on some days.  When I mentioned water exercise as one option for which there is at least some evidence of benefit, Katherine Cruz said that while this is appealing to her, she is limited by her body image - that she does not want to be seen in a swim suit - despite a BMI of <21.    Weight 122.0 lb, down a couple of lb since September.   Usual eating pattern  1-2 real meals and 1-2 snacks daily.  Has been eating only plant foods now since learning that cholesterol went up.   Recent physical activity Plans to restart yoga 2 X wk (which includes a chair yoga followed by mat yoga each time) and hiking 2 X wk.  Fibromyalgia makes exercise challenging.   Sleep Tends to get to bed late, but sleeps pretty well once she goes to bed.  Katherine Cruz feels this is related to her OCD b/c "checking behavior" usually subsides ~9 at night, and she is reluctant to just go to sleep, wanting to enjoy this time.    24-hr recall:  (Up at 7 AM) B (8 AM)-   2 toast, 4 tbsp fruit spread, coffee, splash of soy  milk,  - Went hiking 10-11:30 AM -  Snk (12 AM)-  1 protein bar (220 kcal), blk coffee Snk (2 PM)-   3 dried figs, 1/2 c walnuts, blk coffee Snk (5 PM)-  ~1 c dried mango, 1/4 c walnuts L ( PM)-   --- D (8 PM)-   1 1/2 c pumpkin soup (made with soy milk and garbanzos), 1/4 c walnuts Snk ( PM)-  --- Typical day? Yes.  Gets a salad at [facility] Tuesday or meal from [employer] [place] ~2 X wk - usually vegetables and legumes.    Progress Towards Goal(s):  In progress.   Nutritional Diagnosis:  No progress noted on NB-1.5 Disordered eating pattern As related to food and weight anxiety.  As evidenced by conitinued eating pattern of 1-2 meals a day and highly repetitive food choices.    Intervention:  Nutrition counseling  Handouts given during visit include:   After-Visit Summary (AVS)  Demonstrated degree of understanding via:  Teach Back  Barriers to learning/adherence to lifestyle change: Longstanding food and weight anxiety  Monitoring/Evaluation:  Dietary intake, exercise, and body weight in 5 week(s).

## 2018-06-25 NOTE — Patient Instructions (Addendum)
-   Email results of your lipid panel.    - Water exercise may be a great option for you with respect to the fibro.    - If you are going to avoid animal products, you need to make sure you are getting adequate plant protein.  It's still a good idea to get a major protein source with each meal (e.g., 3 X day).  The classic vegetarian protein source is beans, but you should consider a serving size to be no less than 1 full cup.    - A good protein goal for you is 45-55 grams daily.  An average 1 cup of beans provides about 14 g of protein.  Most plain soy milk has 8 g per cup.  A tbsp of peanut butter is ~8 grams.    - If you use soy milk, SHAKE WELL with each use.    (Beans are also a good source of soluble fiber, which may be helpful in lowering cholesterol.  Nuts and seeds also help to lower LDL.)  - Bone health benefits from high protein intake, but ONLY when calcium (and vitamin D) intake is also good.    Calcium and vitamin D:    - Calcium citrate is the best.  Limit dosage to 500-600 mg per time you take it, and it can be taken 1-2 X day, depending on your food sources of calcium.  Non-dairy food sources: All leafy greens (especially cooked), chia seeds, fortified soy milk, most tofu.    - Vitamin D3 is probably best absorbed.  It's a good idea to get your level checked.  Until then, a general recommendation for vitamin D is 1000 IU per day.

## 2018-07-07 ENCOUNTER — Other Ambulatory Visit: Payer: Self-pay | Admitting: Obstetrics & Gynecology

## 2018-07-07 DIAGNOSIS — N6310 Unspecified lump in the right breast, unspecified quadrant: Secondary | ICD-10-CM

## 2018-07-10 ENCOUNTER — Other Ambulatory Visit: Payer: 59

## 2018-07-17 ENCOUNTER — Ambulatory Visit
Admission: RE | Admit: 2018-07-17 | Discharge: 2018-07-17 | Disposition: A | Discharge status: Home / Self Care | Payer: 59 | Source: Ambulatory Visit | Attending: Obstetrics & Gynecology | Admitting: Obstetrics & Gynecology

## 2018-07-17 ENCOUNTER — Other Ambulatory Visit: Payer: Self-pay | Admitting: Obstetrics & Gynecology

## 2018-07-17 DIAGNOSIS — N6310 Unspecified lump in the right breast, unspecified quadrant: Secondary | ICD-10-CM

## 2018-07-30 ENCOUNTER — Other Ambulatory Visit: Payer: Self-pay

## 2018-07-30 ENCOUNTER — Ambulatory Visit (INDEPENDENT_AMBULATORY_CARE_PROVIDER_SITE_OTHER): Payer: 59 | Admitting: Family Medicine

## 2018-07-30 ENCOUNTER — Encounter: Payer: Self-pay | Admitting: Family Medicine

## 2018-07-30 DIAGNOSIS — F5001 Anorexia nervosa, restricting type: Secondary | ICD-10-CM

## 2018-07-30 NOTE — Patient Instructions (Addendum)
-   Complete the Autonomic Ladder Worksheet identifying where you are on it.    - In addition, when you identify falling down on the ladder, ask yourself the three Decoding Behavior Q's;    1. What am I feeling right now?    2. What do I want to feel?    3. What do I truly need right now? Use the Feelings and Needs lists, and WRITE your answers in a small notebook.   You are looking for feelings, not thoughts.  Bring your responses to your follow-up appt.    Food Goals: 1. Get at least one full cup of vegetables twice daily.   2. Get at least 48 oz of water.  Start your day with at least 12 oz of water - before you do anything.  Aim for at least 32 oz of water by noon!  Remember that you don't want to be a slave to perfectionism!

## 2018-07-30 NOTE — Progress Notes (Signed)
Medical Nutrition Therapy:  Appt start time: 1100 end time:  1200. Therapist Creig Hines, [facility]; [facility] (Phone [phone]; Fax [phone]) Med's Management [name], APMHNP-BC Pulmonologist Chilton Greathouse, MD Cardiologist Yates Decamp, MD  Assessment:  Primary concerns today: restrictive eating (anorexia nervosa).  Wt is stable at 122.4 lb.   Misk said her eating has been especially disordered recently.  She cites the stress related to her family dysfunction is driving these behaviors.  Depression remains pretty bad, and she is fighting the inclination to isolate.  Kierrah has been eating peanut butter several times daily, which has been a comfort food for her.    We reviewed Polyvagal Theory today, which may be helpful to Kaylei's understanding of how she is responding to her environment (as well as to her own thoughts), providing a first step toward managing triggering thoughts or incidents.    Labs 10/18/17 04/24/18 TC  210 220 TG    56   75 LDL 135 136 VLDL   11   15 HDL   64   69   Usual eating pattern  1-2 real meals and 1-2 snacks daily.  Recent physical activity Did not inquire about.   Sleep Tends to get to bed late still; frequently feels tired.   Progress Towards Goal(s):  In progress.   Nutritional Diagnosis:  No progress noted on NB-1.5 Disordered eating pattern As related to food and weight anxiety.  As evidenced by conitinued eating pattern of 1-2 meals a day and highly repetitive food choices.    Intervention:  Nutrition counseling.    Handouts given during visit include:   After-Visit Summary (AVS)  Demonstrated degree of understanding via:  Teach Back  Barriers to learning/adherence to lifestyle change: Longstanding food and weight anxiety  Monitoring/Evaluation:  Dietary intake, exercise, and body weight in 4 week(s).

## 2018-08-07 ENCOUNTER — Ambulatory Visit: Payer: 59 | Admitting: Family Medicine

## 2018-08-12 ENCOUNTER — Telehealth: Payer: Self-pay

## 2018-08-12 NOTE — Telephone Encounter (Signed)
Attempted to contact pt for COVID-19 pre-screen. Left message to call back.  

## 2018-08-13 NOTE — Telephone Encounter (Signed)
Called patient to offer an evisit. Patient agreeable to setting up a virtual video conference.  Appointment updated.

## 2018-08-14 ENCOUNTER — Other Ambulatory Visit: Payer: Self-pay

## 2018-08-17 ENCOUNTER — Telehealth: Payer: Self-pay

## 2018-08-17 ENCOUNTER — Telehealth (INDEPENDENT_AMBULATORY_CARE_PROVIDER_SITE_OTHER): Payer: 59 | Admitting: Internal Medicine

## 2018-08-17 ENCOUNTER — Other Ambulatory Visit: Payer: Self-pay

## 2018-08-17 ENCOUNTER — Encounter: Payer: Self-pay | Admitting: Internal Medicine

## 2018-08-17 VITALS — BP 111/61 | HR 70 | Wt 120.0 lb

## 2018-08-17 DIAGNOSIS — Z7189 Other specified counseling: Secondary | ICD-10-CM

## 2018-08-17 DIAGNOSIS — Z13220 Encounter for screening for lipoid disorders: Secondary | ICD-10-CM

## 2018-08-17 NOTE — Telephone Encounter (Signed)
Cardiac Questionnaire:    Since your last visit or hospitalization:    1. Have you been having new or worsening chest pain? NO   2. Have you been having new or worsening shortness of breath? NO 3. Have you been having new or worsening leg swelling, wt gain, or increase in abdominal girth (pants fitting more tightly)? NO   4. Have you had any passing out spells? NO    *A YES to any of these questions would result in the appointment being kept. *If all the answers to these questions are NO, we should indicate that given the current situation regarding the worldwide coronarvirus pandemic, at the recommendation of the CDC, we are looking to limit gatherings in our waiting area, and thus will reschedule their appointment beyond four weeks from today.   _____________   COVID-19 Pre-Screening Questions:  . Do you currently have a fever? NO (yes = cancel and refer to pcp for e-visit) . Have you recently travelled on a cruise, internationally, or to [place], [place], [place], [place], [place], or [place] [employer] ? NO (yes = cancel, stay home, monitor symptoms, and contact pcp or initiate e-visit if symptoms develop) . Have you been in contact with someone that is currently pending confirmation of Covid19 testing or has been confirmed to have the Covid19 virus?  NO (yes = cancel, stay home, away from tested individual, monitor symptoms, and contact pcp or initiate e-visit if symptoms develop) . Are you currently experiencing fatigue or cough? YES; DRY COUGH (yes = pt should be prepared to have a mask placed at the time of their visit).            Virtual Visit Pre-Appointment Phone Call  Steps For Call:  1. Confirm consent - "In the setting of the current Covid19 crisis, you are scheduled for a (phone or video) visit with your provider on (date) at (time).  Just as we do with many in-office visits, in order for you to participate in this visit, we must obtain consent.  If you'd like, I can send this  to your mychart (if signed up) or email for you to review.  Otherwise, I can obtain your verbal consent now.  All virtual visits are billed to your insurance company just like a normal visit would be.  By agreeing to a virtual visit, we'd like you to understand that the technology does not allow for your provider to perform an examination, and thus may limit your provider's ability to fully assess your condition.  Finally, though the technology is pretty good, we cannot assure that it will always work on either your or our end, and in the setting of a video visit, we may have to convert it to a phone-only visit.  In either situation, we cannot ensure that we have a secure connection.  Are you willing to proceed?"  2. Give patient instructions for WebEx download to smartphone as below if video visit  3. Advise patient to be prepared with any vital sign or heart rhythm information, their current medicines, and a piece of paper and pen handy for any instructions they may receive the day of their visit  4. Inform patient they will receive a phone call 15 minutes prior to their appointment time (may be from unknown caller ID) so they should be prepared to answer  5. Confirm that appointment type is correct in Epic appointment notes (video vs telephone)    TELEPHONE CALL NOTE  Lorenz CoasterValeria Gorelick has been  deemed a candidate for a follow-up tele-health visit to limit community exposure during the Covid-19 pandemic. I spoke with the patient via phone to ensure availability of phone/video source, confirm preferred email & phone number, and discuss instructions and expectations.  I reminded Roselene Deemer to be prepared with any vital sign and/or heart rhythm information that could potentially be obtained via home monitoring, at the time of her visit. I reminded Shelle Ackerman to expect a phone call at the time of her visit if her visit.  Did the patient verbally acknowledge consent to treatment? YES  Manaia Samad Cottie Banda,  RN 08/17/2018 9:15 AM   DOWNLOADING THE WEBEX SOFTWARE TO SMARTPHONE  - If Apple, go to [employer] and type in WebEx in the search bar. Download [employer], the blue/green circle. The app is free but as with any other app downloads, their phone may require them to verify saved payment information or Apple password. The patient does NOT have to create an account.  - If Android, ask patient to go to [employer] and type in WebEx in the search bar. Download [employer], the blue/green circle. The app is free but as with any other app downloads, their phone may require them to verify saved payment information or Android password. The patient does NOT have to create an account.   CONSENT FOR TELE-HEALTH VISIT - PLEASE REVIEW  I hereby voluntarily request, consent and authorize [facility] and its employed or contracted physicians, physician assistants, nurse practitioners or other licensed health care professionals (the Practitioner), to provide me with telemedicine health care services (the "Services") as deemed necessary by the treating Practitioner. I acknowledge and consent to receive the Services by the Practitioner via telemedicine. I understand that the telemedicine visit will involve communicating with the Practitioner through live audiovisual communication technology and the disclosure of certain medical information by electronic transmission. I acknowledge that I have been given the opportunity to request an in-person assessment or other available alternative prior to the telemedicine visit and am voluntarily participating in the telemedicine visit.  I understand that I have the right to withhold or withdraw my consent to the use of telemedicine in the course of my care at any time, without affecting my right to future care or treatment, and that the Practitioner or I may terminate the telemedicine visit at any time. I understand that I have the right to inspect all  information obtained and/or recorded in the course of the telemedicine visit and may receive copies of available information for a reasonable fee.  I understand that some of the potential risks of receiving the Services via telemedicine include:  Marland Kitchen Delay or interruption in medical evaluation due to technological equipment failure or disruption; . Information transmitted may not be sufficient (e.g. poor resolution of images) to allow for appropriate medical decision making by the Practitioner; and/or  . In rare instances, security protocols could fail, causing a breach of personal health information.  Furthermore, I acknowledge that it is my responsibility to provide information about my medical history, conditions and care that is complete and accurate to the best of my ability. I acknowledge that Practitioner's advice, recommendations, and/or decision may be based on factors not within their control, such as incomplete or inaccurate data provided by me or distortions of diagnostic images or specimens that may result from electronic transmissions. I understand that the practice of medicine is not an exact science and that Practitioner makes no warranties or guarantees regarding  treatment outcomes. I acknowledge that I will receive a copy of this consent concurrently upon execution via email to the email address I last provided but may also request a printed copy by calling the office of [facility].    I understand that my insurance will be billed for this visit.   I have read or had this consent read to me. . I understand the contents of this consent, which adequately explains the benefits and risks of the Services being provided via telemedicine.  . I have been provided ample opportunity to ask questions regarding this consent and the Services and have had my questions answered to my satisfaction. . I give my informed consent for the services to be provided through the use of telemedicine in my  medical care  By participating in this telemedicine visit I agree to the above.

## 2018-08-17 NOTE — Telephone Encounter (Signed)
Pt aware of AVS information and verbalized understanding. Pt aware that lab slip and AVS will be sent in the mail. Pt agreeable with this.

## 2018-08-17 NOTE — Progress Notes (Signed)
Thanks my friend. She is tough

## 2018-08-17 NOTE — Progress Notes (Signed)
Virtual Visit via Video Note    Evaluation Performed:  New lipid clinic consult    This visit type was conducted due to national recommendations for restrictions regarding the COVID-19 Pandemic (e.g. social distancing).  This format is felt to be most appropriate for this patient at this time.  All issues noted in this document were discussed and addressed.  No physical exam was performed (except for noted visual exam findings with Video Visits).  Please refer to the patient's chart (MyChart message for video visits and phone note for telephone visits) for the patient's consent to telehealth for [facility].  Date:  08/17/2018   ID:  Katherine Cruz, DOB 10-15-1965, MRN [id number]  Patient Location:  [address] [place] [address]  Provider location:   [address] [place], [place] [address]  PCP:  Patient, No Pcp Per  Cardiologist:  No primary care provider on file. Electrophysiologist:  None   Chief Complaint:  Evaluate dyslipidemia  History of Present Illness:    Katherine Cruz is a 53 y.o. female who presents via audio/video conferencing for a telehealth visit today.  This is a pleasant 53 year old female originally from [place] who presents for evaluation of dyslipidemia.  She has no past medical history significant for coronary disease, hypertension, diabetes or other significant cardiovascular risk factors.  Her family history is not significant for first-degree relatives with known coronary disease.  She was referred for evaluation of dyslipidemia in the context of trying to manage her risk with diet and lifestyle factors alone.  She has been followed by Dr. Jacinto Halim at [facility] cardiovascular for cardiovascular risk reduction.  She had a recent coronary artery calcium score which was 0.  She also had a chest CT scan in September 2019 ordered by Dr. Isaiah Serge for follow-up of a pulmonary nodule.  She had a history of smoking in the past.  The CT scan showed no  obvious coronary or aortic atherosclerosis/calcification.  She feels well and is asymptomatic.  She had a lipid profile in January 2020 which showed a total cholesterol 220, triglycerides 75, HDL 69 and LDL of 136.  This represents a slight increase in cholesterol from her previous numbers.  She has also been seeing a dietitian who has been working with her on this issue.  Ms. Porrata tells me that she converted to a vegan diet 3 months ago and has been pretty successful at staying on top of that.  She also maintain some activity.  Notes from the dietitian indicate a history of eating disorder and therefore she is being monitored very closely.  She also has a history of fibromyalgia and is concerned about taking a statin due to possible side effects.  The patient does not have symptoms concerning for COVID-19 infection (fever, chills, cough, or new SHORTNESS OF BREATH).    Prior CV studies:   The following studies were reviewed today:  Coronary calcium score of 0 (2019) CT scan of the chest (01/16/2018) -no acute cardiopulmonary disease, no coronary or atherosclerotic calcification Treadmill stress test (10/07/2016)-negative for ischemia Echocardiogram (12/28/2014)-LVEF 60%, mild MR and mild TR Stress echo (03/14/2009) - Negative for ischemia  PMHx:  Past Medical History:  Diagnosis Date  . Allergic rhinitis   . Breast mass 08/23/2010  . Fibrocystic breast   . Fibromyalgia   . Mitral valve prolapse   . Osteoarthritis   . Ovarian cyst   . PAC (premature atrial contraction)   . Positive PPD   . PVC (  premature ventricular contraction)   . UTI (urinary tract infection)     Past Surgical History:  Procedure Laterality Date  . CERVICAL POLYPECTOMY    . MAXILLARY SINUS LIFT    . REPLANTATION THUMB    . TUBAL LIGATION      FAMHx:  Family History  Problem Relation Age of Onset  . Allergies Father   . Kidney disease Brother   . Heart disease Neg Hx   . Hypertension Neg Hx   . Diabetes Neg  Hx   . Cancer Neg Hx        breast or colon  . Stroke Neg Hx   . Colon cancer Neg Hx     SOCHx:   reports that she quit smoking about 5 years ago. Her smoking use included cigarettes. She has a 35.00 pack-year smoking history. She has never used smokeless tobacco. She reports that she does not drink alcohol or use drugs.  ALLERGIES:  Allergies  Allergen Reactions  . Latex Itching    MEDS:  No outpatient medications have been marked as taking for the 08/17/18 encounter (Telemedicine) with Chrystie Nose, MD.     ROS: Pertinent items noted in HPI and remainder of comprehensive ROS otherwise negative.  Labs/Other Tests and Data Reviewed:    Recent Labs: 01/02/2018: Hemoglobin 13.5; Platelets 214.0   Recent Lipid Panel No results found for: CHOL, TRIG, HDL, CHOLHDL, LDLCALC, LDLDIRECT  Wt Readings from Last 3 Encounters:  08/17/18 120 lb (54.4 kg)  07/30/18 122 lb 6.4 oz (55.5 kg)  06/25/18 122 lb (55.3 kg)     Exam:    Vital Signs:  BP 111/61   Pulse 70   Wt 120 lb (54.4 kg)   LMP 03/24/2011   BMI 20.60 kg/m    General appearance: alert and no distress Neurologic: Mental status: Alert, oriented, thought content appropriate Psych: Pleasant, non-anxious  ASSESSMENT & PLAN:    1.  Katherine Cruz fortunately had a 0 coronary artery calcium score last year and a low risk exercise stress test.  While her total cholesterol is over 200, this is primarily driven by high HDL cholesterol of 69 and modestly elevated LDL cholesterol of 136.  Although patients with elevated HDL may not have functional HDL cholesterol and could be at increased risk for coronary disease, there is no evidence that that is the case in her, in fact I suspect it is cardioprotective.  There is no family history of early onset heart disease also lowering her risk.  Her MESA ten-year risk score including coronary calcium is 1% for cardiovascular events.  Given this low number, pharmacologic therapy is not  recommended.  Furthermore, she is adopted a vegan diet over the past 3 months and I suspect this will further positively impact her lipid profile.  Given her current pandemic climate, I would recommend a repeat lipid profile in 3 months which will obtain and I will follow-up with her at that time either in person or with an eVisit.  If her LDL remains above 130 on maximal dietary therapy, we could consider the addition of either plant phytosterols (cholesterol) or red yeast rice, however I cautioned her that red yeast rice can cause myalgias in some patients and it could be more significant in a patient with history of fibromyalgia.  Thanks for the kind referral. Follow-up in 3 months.  COVID-19 Education: The signs and symptoms of COVID-19 were discussed with the patient and how to seek care for testing (follow up  with PCP or arrange E-visit).  The importance of social distancing was discussed today.  Patient Risk:   After full review of this patients clinical status, I feel that they are at least moderate risk at this time.  Time:   Today, I have spent 45 minutes with the patient with telehealth technology discussing her cardiovascular risk, recent lipid profile, diet, family history, prior cardiovascular studies, and recommendations for future lipid management.     Medication Adjustments/Labs and Tests Ordered: Current medicines are reviewed at length with the patient today.  Concerns regarding medicines are outlined above.   Tests Ordered: Orders Placed This Encounter  Procedures  . Lipid panel    Medication Changes: No orders of the defined types were placed in this encounter.   Disposition:  in 3 month(s)  Chrystie Nose, MD, [facility], FACP  [place]  [facility]  Medical Director of the Advanced Lipid Disorders &  Cardiovascular Risk Reduction Clinic Diplomate of the American Board of Clinical Lipidology Attending Cardiologist  Direct Dial: [phone]  Fax:  [phone]  Website:  [email or web address]  Chrystie Nose, MD  08/17/2018 10:18 AM

## 2018-08-17 NOTE — Patient Instructions (Signed)
Medication Instructions:  Your physician recommends that you continue on your current medications as directed (if you are currently taking any medications). Please refer to the Current Medication list given to you today.  If you need a refill on your cardiac medications before your next appointment, please call your pharmacy.   Lab work: Your physician recommends that you return for lab work in: 3 months (fasting lipid profile)  If you have labs (blood work) drawn today and your tests are completely normal, you will receive your results only by: Marland Kitchen MyChart Message (if you have MyChart) OR . A paper copy in the mail If you have any lab test that is abnormal or we need to change your treatment, we will call you to review the results.  Testing/Procedures: NONE  Follow-Up: At [facility], you and your health needs are our priority.  As part of our continuing mission to provide you with exceptional heart care, we have created designated Provider Care Teams.  These Care Teams include your primary Cardiologist (physician) and Advanced Practice Providers (APPs -  Physician Assistants and Nurse Practitioners) who all work together to provide you with the care you need, when you need it. You will need a follow up appointment in 3 months with [name]. Please present for the above-mentioned lab work before your next appointment with [name].

## 2018-08-27 ENCOUNTER — Ambulatory Visit: Payer: 59 | Admitting: Family Medicine

## 2018-10-29 ENCOUNTER — Ambulatory Visit: Payer: 59 | Admitting: Cardiology

## 2018-10-29 ENCOUNTER — Encounter: Payer: Self-pay | Admitting: Cardiology

## 2018-12-22 ENCOUNTER — Encounter: Payer: Self-pay | Admitting: *Deleted

## 2018-12-31 ENCOUNTER — Telehealth: Payer: Self-pay | Admitting: Internal Medicine

## 2018-12-31 NOTE — Telephone Encounter (Signed)
Spoke with patient about her 01/11/19 lipid clinic follow up. She prefers a telemedicine visit but does not want to get lab work done yet as she does not want to go out for this (d/t covid). She would like to wait a while longer. R/S patient for 03/09/19 (non-lipid day, however patient wanted a virtual appointment) and she was advised to get labs done at least by 10/16.

## 2019-01-11 ENCOUNTER — Ambulatory Visit: Payer: 59 | Admitting: Internal Medicine

## 2019-03-09 ENCOUNTER — Ambulatory Visit: Payer: 59 | Admitting: Internal Medicine

## 2019-04-20 ENCOUNTER — Ambulatory Visit: Payer: 59 | Admitting: Internal Medicine

## 2019-07-06 ENCOUNTER — Ambulatory Visit: Payer: 59 | Admitting: Family Medicine

## 2019-07-09 ENCOUNTER — Ambulatory Visit (INDEPENDENT_AMBULATORY_CARE_PROVIDER_SITE_OTHER)
Admission: RE | Admit: 2019-07-09 | Discharge: 2019-07-09 | Disposition: A | Discharge status: Home / Self Care | Payer: 59 | Source: Ambulatory Visit

## 2019-07-09 DIAGNOSIS — B070 Plantar wart: Secondary | ICD-10-CM

## 2019-07-09 DIAGNOSIS — B351 Tinea unguium: Secondary | ICD-10-CM

## 2019-07-09 MED ORDER — CICLOPIROX 8 % EX SOLN: Freq: Every day | CUTANEOUS | 0 refills | Status: DC

## 2019-07-09 MED ORDER — MUPIROCIN 2 % EX OINT: 1.0000 "application " | TOPICAL_OINTMENT | Freq: Two times a day (BID) | CUTANEOUS | 0 refills | Status: DC

## 2019-07-09 NOTE — Discharge Instructions (Signed)
-   Please use Penlac daily on 2 nails to treat fungal infection - Toe fungal infections often take 6 months to resolve -Please follow-up with primary care or podiatry if you are having continued concerns around nail fungus - Please try over-the-counter Compound W or wart remover to area on the bottom of your toe - Please try soaking feet in warm water, dry well after soaking and apply Bactroban to areas of redness -If redness worsening, becoming more painful or areas of pus please follow-up as we may need to put you on oral antibiotics -Try over-the-counter O'Keefe's healthy hands and feet to apply to areas of cracking and white scaly areas

## 2019-07-09 NOTE — ED Provider Notes (Signed)
Virtual Visit via Video Note:  Katherine Cruz  initiated request for Telemedicine visit with [facility] team. I connected with Katherine Cruz  on 07/09/2019 at 5:31 PM  for a synchronized telemedicine visit using a video enabled HIPPA compliant telemedicine application. I verified that I am speaking with Katherine Cruz  using two identifiers. Katherine Cislo C Oney Tatlock, PA-C  was physically located in a [place] [facility] site and Katherine Cruz was located at a different location.   The limitations of evaluation and management by telemedicine as well as the availability of in-person appointments were discussed. Patient was informed that she  may incur a bill ( including co-pay) for this virtual visit encounter. Katherine Cruz  expressed understanding and gave verbal consent to proceed with virtual visit.     History of Present Illness:Katherine Cruz  is a 54 y.o. female presents for evaluation of possible fungal infections.  Patient states that over the past 2 weeks she has noticed discoloration to her toenails as well as a lesion to the bottom of her toe and at times areas of redness and pain.  Patient notes that her toenails have become thicker, yellow or and more brittle.  Around 2 lesser toes she has noticed some redness around the nailbed with a throbbing pain sensation.  Denies any drainage.  Denies fever.  Denies rash or discoloration between the toes.  She also has noticed a circular area with a black speck in the center on the bottom of one of her great toes.  Admits to firmness associated with this.  Tender with weightbearing.   Allergies  Allergen Reactions  . Latex Itching     Past Medical History:  Diagnosis Date  . Allergic rhinitis   . Breast mass 08/23/2010  . Fibrocystic breast   . Fibromyalgia   . Osteoarthritis   . Ovarian cyst   . PAC (premature atrial contraction)   . Positive PPD   . PVC (premature ventricular contraction)   . UTI (urinary tract infection)      Social  History   Tobacco Use  . Smoking status: Former Smoker    Packs/day: 1.00    Years: 35.00    Pack years: 35.00    Types: Cigarettes    Quit date: 2015    Years since quitting: 6.1  . Smokeless tobacco: Never Used  Substance Use Topics  . Alcohol use: No  . Drug use: No        Observations/Objective: Physical Exam  Constitutional: She is oriented to person, place, and time and well-developed, well-nourished, and in no distress. No distress.  HENT:  Head: Normocephalic and atraumatic.  Pulmonary/Chest: Effort normal. No respiratory distress.  Speaking in full sentences  Musculoskeletal:     Cervical back: Normal range of motion.     Comments: Moving all extremities appropriately  Neurological: She is alert and oriented to person, place, and time.  Speech clear, face symmetric  Skin:  Great toe does appear discolored, slightly slightly thickened and separated from nail bed, to lesser toes with very faint erythema noted around nail bed  Small circular slightly raised area with central black speck noted to plantar surface of great toe  Cracking noted to bilateral plantar surface around the heels     Assessment and Plan:    ICD-10-CM   1. Onychomycosis  B35.1   2. Plantar wart  B07.0    Does appear to have likely onychomycosis of toes.  Discussed difficult nature of treating this.  Will initiate on topical Penlac and have follow-up with PCP or podiatrist if not resolving or wishing to try oral therapy as alternative.  Lesion on plantar surface does appear to be more wartlike.  Recommended to use over-the-counter Compound W or wart remover for this.    Patient has 2 toes with some redness, initially offered Keflex to treat possible paronychia, patient wished to defer this at this time, provided Bactroban to apply topically and recommended warm soaks.  Recommended moist radiation to heels and hands to treat for dryness  Recommended following up in person if any of  symptoms not resolving or worsening for better evaluation of all of these skin/nail irregularities.    Follow Up Instructions:     I discussed the assessment and treatment plan with the patient. The patient was provided an opportunity to ask questions and all were answered. The patient agreed with the plan and demonstrated an understanding of the instructions.   The patient was advised to call back or seek an in-person evaluation if the symptoms worsen or if the condition fails to improve as anticipated.      Lew Dawes, PA-C  07/09/2019 5:31 PM         Lew Dawes, PA-C 07/10/19 [phone]

## 2019-07-15 ENCOUNTER — Other Ambulatory Visit: Payer: Self-pay

## 2019-07-15 ENCOUNTER — Ambulatory Visit (INDEPENDENT_AMBULATORY_CARE_PROVIDER_SITE_OTHER): Payer: 59 | Admitting: Family Medicine

## 2019-07-15 DIAGNOSIS — F5001 Anorexia nervosa, restricting type: Secondary | ICD-10-CM

## 2019-07-15 NOTE — Progress Notes (Signed)
Telehealth Encounter Therapist Creig Hines, [facility]; [facility] (Fax [phone]) Med's Management [name], APMHNP-BC Pulmonologist Chilton Greathouse, MD Cardiologist Yates Decamp, MD I connected with Katelinn Justice (MRN [id number]) on 07/15/2019 by MyChart video-enabled, HIPAA-compliant telemedicine application, verified that I am speaking with the correct person using two identifiers, and that the patient was in a private environment conducive to confidentiality.  The patient agreed to proceed.  Provider was Linna Darner, PhD, RD, LDN, CEDRD Provider(s) located at [facility] during this telehealth encounter; patient was at home  Appt start time: 1430 end time: 1530 (1 hour)  Reason for telehealth visit: Medical Nutrition Therapy for MNT for food and eating behaviors related to anxiety.  Originally referred by therapist [name], LPCA, [facility] for anorexia nervosa.    Relevant history/background: Yoshino has been extremely anxious, staying home all the time b/c of worries about COVID-19.  She is also nervous about a skin condition that has developed recently, worried that it may reflect compromised immune function related to poor diet.    Assessment: Diet has been very limited and repetitive for many months now.  Keslee's boyfriend buys most of her food; she has avoided fresh fruits and veg's, even frozen foods, for fear of Sars-CoV-2 contamination.  Although she takes her dog out 3 X day, she stays on her own property, and has had very limited social contact, which likely fuels her anxiety.  She has continued to see her therapist [name] regularly via remote visits.   Usual eating pattern: 3 meals and variable snacks per day. Frequent foods and beverages: alkaline water, 1 c vegetable juices (carrot, apple, beet), coffee with soy milk; Ezekial bread, peanut butter, jelly, lentil or rice pasta, beans, rice.   Usual physical  activity: none currently. Sleep: Not sleeping well.  Goes to bed ~9:30, but doesn't sleep till ~midnight.  Gets up ~9 AM.   No food recall today.   Intervention: Reviewed recent diet and exercise history, and set behavioral goals to help Shakyia establish some eating structure with more balanced nutrition.   For recommendations and goals, see Patient Instructions.    Follow-up: Telehealth appt in 4 weeks.   Floella Ensz,[name]

## 2019-07-15 NOTE — Patient Instructions (Addendum)
Your hair thinning may be related to not getting enough calories and/or protein.  It will be important for you to pay more attention to eating enough, and getting more balanced meals.    Goals:  1. Get up at 8 AM every day, and go to bed no earlier than 11:30 PM.       If you want to go to bed earlier, first start getting up earlier.   2. Walk outside for at least 10 minutes 3 times a week.   3. Obtain at least 2 meals a day that include a source of protein, starch, and vegetables and/or fruit.      Get a variety of beans, lentils, and peas.  Also try some soy yogurt.    - Snack as desired, especially with fresh fruit.  If you are worried, start with fruits that have a skin you do not eat.  Consider using some frozen fruit blended into a smoothie with soy milk or tofu.    - Email [name] every weekend to report your walking for the week.    - Look into some online classes, including online tai chi classes and meditations.    - Follow-up appt  via MyChart on Thursday, March 25 at 1:30 PM.

## 2019-08-12 ENCOUNTER — Encounter: Payer: 59 | Admitting: Family Medicine

## 2019-08-12 ENCOUNTER — Other Ambulatory Visit: Payer: Self-pay

## 2019-08-12 NOTE — Progress Notes (Signed)
This encounter was created in error - please disregard.

## 2019-08-25 ENCOUNTER — Telehealth: Payer: Self-pay | Admitting: General Practice

## 2019-08-25 NOTE — Telephone Encounter (Signed)
Copied from CRM [id number]. Topic: General - Other >> Aug 25, 2019 10:32 AM Herby Abraham C wrote: Pt called in for assistance. Pt says that she received the TB vaccine when she was younger.. pt says that she has heard that it could effect her with having covid vaccine (pt is scheduled) pt says that she would like to discuss further with clinical. Advised/suggested that pt call her PCP, pt stated that she doesn't have one to discuss this with.

## 2019-08-25 NOTE — Telephone Encounter (Signed)
Per initial encounter, "Pt called in for assistance. Pt says that she received the TB vaccine when she was younger.. pt says that she has heard that it could effect her with having covid vaccine (pt is scheduled) pt says that she would like to discuss further with clinical. Advised/suggested that pt call her PCP, pt stated that she doesn't have one to discuss this with."; contacted pt and she states that she was vaccinated for TB in [place]; explained that the infectious agent for TB is a bacterium vs a virus for COVID; advised pt that previous TB vaccine is not a contraindication to to COVID vaccine; she verbalized understanding

## 2019-08-26 ENCOUNTER — Ambulatory Visit: Payer: 59 | Attending: Internal Medicine

## 2019-08-26 DIAGNOSIS — Z23 Encounter for immunization: Secondary | ICD-10-CM

## 2019-08-26 NOTE — Progress Notes (Signed)
   Covid-19 Vaccination Clinic  Name:  Katherine Cruz    MRN: [id number] DOB: 02/12/1966  08/26/2019  [name] was observed post Covid-19 immunization for 15 minutes without incident. She was provided with Vaccine Information Sheet and instruction to access the V-Safe system.   [name] was instructed to call 911 with any severe reactions post vaccine: Marland Kitchen Difficulty breathing  . Swelling of face and throat  . A fast heartbeat  . A bad rash all over body  . Dizziness and weakness   Immunizations Administered    Name Date Dose VIS Date Route   Pfizer COVID-19 Vaccine 08/26/2019  9:10 AM 0.3 mL 04/30/2019 Intramuscular   Manufacturer: [employer], [employer]   Lot: [id number]   NDC: [address]-4

## 2019-09-21 ENCOUNTER — Ambulatory Visit: Payer: 59 | Attending: Internal Medicine

## 2019-09-21 DIAGNOSIS — Z23 Encounter for immunization: Secondary | ICD-10-CM

## 2019-09-21 NOTE — Progress Notes (Signed)
   Covid-19 Vaccination Clinic  Name:  Mckinsey Keagle    MRN: [id number] DOB: [birth year]  09/21/2019  Ms. Merrihew was observed post Covid-19 immunization for 15 minutes without incident. She was provided with Vaccine Information Sheet and instruction to access the V-Safe system.   Ms. Pitter was instructed to call 911 with any severe reactions post vaccine: Marland Kitchen Difficulty breathing  . Swelling of face and throat  . A fast heartbeat  . A bad rash all over body  . Dizziness and weakness   Immunizations Administered    Name Date Dose VIS Date Route   Pfizer COVID-19 Vaccine 09/21/2019 10:11 AM 0.3 mL 07/14/2018 Intramuscular   Manufacturer: [employer], [employer]   Lot: [id number]   NDC: [address]-7

## 2019-09-23 ENCOUNTER — Ambulatory Visit (INDEPENDENT_AMBULATORY_CARE_PROVIDER_SITE_OTHER): Payer: 59 | Admitting: Family Medicine

## 2019-09-23 ENCOUNTER — Other Ambulatory Visit: Payer: Self-pay

## 2019-09-23 DIAGNOSIS — F5001 Anorexia nervosa, restricting type: Secondary | ICD-10-CM

## 2019-09-23 NOTE — Progress Notes (Signed)
Telehealth Encounter Therapist Creig Hines, [facility]; [facility] (Fax [phone]) Med's Management [name], APMHNP-BC Pulmonologist Chilton Greathouse, MD Cardiologist Yates Decamp, MD I connected with Shakeira Rhee (MRN [id number]) on 09/23/2019 by MyChart video-enabled, HIPAA-compliant telemedicine application, verified that I am speaking with the correct person using two identifiers, and that the patient was in a private environment conducive to confidentiality.  The patient agreed to proceed.  Provider was Linna Darner, PhD, RD, LDN, CEDRD Provider(s) located at [facility] during this telehealth encounter; patient was at home  Appt start time: 1330 end time: 1430 (1 hour)  Reason for telehealth visit: Medical Nutrition Therapy for MNT for food and eating behaviors related to anxiety.  Originally referred for  for anorexia nervosa by therapist [name], LPCA, [facility]    Relevant history/background: Shanera has been extremely anxious, staying home all the time b/c of worries about COVID-19.  The skin condition that developed on her feet several months ago has resolved recently, but she has still not started walking b/c of social anxiety.    Assessment: Maleigh has received her second COVID vaccination this week, so feels relieved about that, but has continued to feel extreme anxiety, including recent financial stress.  In addition, her health insurance has discontinued to provide coverage for her long-time therapist.  She has managed to make some meaningful behavioral changes (see below).   Recent usual eating pattern: 3 meals and variable snacks per day. Progress on previous goals: Sleep time: Bedtime has usually been 11:30PM; up at 8:30 most days.   Walking: Has not done this d/t fears of being seen outside.   Meals with pro, starch, & veg's: Is now using frozen vegetables regularly.  Also using salads with oil and vinegar.  Often  eating meals that are incomplete, however (see recall below).   24-hr recall:  (Up at 8:30 AM) B (10 AM)-  1 1/2 pb&j sandwich (3 slices bread), 1 c coffee with soy milk Snk (12 PM)-  8 oz beet&orange juice L (3 PM)-  1 large salad, 1 c rice&beans, olive oil, vinegar,  1 slc bread as croutons, water Snk (5 PM)-  1 c coffee with soy milk, 1 c "green juice" D (7:30 PM)-  1 1/2 pb&j sandwich (3 slices bread), 8 oz beet&orange juice Snk (10 PM)-  1 tbsp peanut butter, 1 tbsp jelly Typical day? Yes.  Usually has 3 cups of coffee.    Intervention: Reviewed recent diet and exercise history, and re-established behavioral goals.  For recommendations and goals, see Patient Instructions.    Follow-up: Telehealth appt in 4 weeks.   Zamere Pasternak,[name]

## 2019-09-23 NOTE — Patient Instructions (Addendum)
Goals: 1. Walk outside at least 1 block at least 2 times a week.      After you have walked, write a couple of lines about the experience AND fill our where you are on the ANS ladder worksheet (include date & time).   2. For both lunch and dinner, include a source of protein, some starch, and vegetables.       Following breakfast, go no more than 5 hours between eating.    Read the "Public librarian," and use the Autonomic Nervous System Worksheet provided today.  Make note of where you are on the ladder, and what "glimmer" or "trigger" pushed you up or down the ladder. Remember to label each section of the ladder (in box on the right) with a word or short phrase that helps you understand that area of functioning.       Remember, once you know your glimmers and triggers, you can use this information to help you move upward on the ladder!    Send the list of therapists your insurance covers for me to look over for possible recommendations.    Follow-up video appointment on Tuesday, June 1 at 2:30 PM.

## 2019-10-19 ENCOUNTER — Ambulatory Visit (INDEPENDENT_AMBULATORY_CARE_PROVIDER_SITE_OTHER): Payer: 59 | Admitting: Family Medicine

## 2019-10-19 ENCOUNTER — Other Ambulatory Visit: Payer: Self-pay

## 2019-10-19 DIAGNOSIS — F5001 Anorexia nervosa, restricting type: Secondary | ICD-10-CM

## 2019-10-19 NOTE — Patient Instructions (Addendum)
Goals 1. Take chair yoga, mat yoga (and tai chi when ready) at the [facility] 2 times a week.      - Enroll in classes today.    2. Eat at least 3 REAL meals (breakfast, lunch, and dinner) and 1-2 snacks per day.  Eat breakfast within 2 hours of getting up.  Aim for no more than 5 hours between eating.   For lunch and dinner, a REAL meal includes at least some protein, some starch, and vegetables and/or fruit.    Follow-up telehealth appt via MyChart on Tuesday, July 6 at 2 PM.

## 2019-10-19 NOTE — Progress Notes (Signed)
Telehealth Encounter Therapist TBD Med's Management [name], APMHNP-BC Pulmonologist Chilton Greathouse, MD Cardiologist Yates Decamp, MD I connected with Katherine Cruz (MRN [id number]) on 10/19/2019 by MyChart video-enabled, HIPAA-compliant telemedicine application, verified that I am speaking with the correct person using two identifiers, and that the patient was in a private environment conducive to confidentiality.  The patient agreed to proceed.  Provider was Linna Darner, PhD, RD, LDN, CEDRD Provider(s) located at [facility] during this telehealth encounter; patient was at home  Appt start time: 1430 end time: 1530 (1 hour)  Reason for telehealth visit: Medical Nutrition Therapy for MNT for food and eating behaviors related to anxiety.  Originally referred for anorexia nervosa by therapist [name], LPCA, [facility].    Relevant history/background: Katherine Cruz has been extremely anxious, staying home all the time b/c of worries about COVID-19.  The skin condition that developed on her feet several months ago has resolved recently, but she has still not started walking b/c of social anxiety.    Assessment: Katherine Cruz has been extremely stressed, mostly related to dysfunctional family dynamics.  These new stressors come at an especially bad time, with her health insurance no longer covering her long-time therapist, and Katherine Cruz does not have a new therapist yet.  She has not followed any previous recommendations, too distraught from family interactions. She feels she is just eating what is available and convenient.   Katherine Cruz was confused about what is a normal, healthy eating pattern, and she had many questions about recommendations.  Katherine Cruz is challenged staying on a schedule b/c OCD tendencies, which sometimes mean getting sidetracked from intentions.   No food recall today.   Intervention: Reviewed recent diet and exercise history, and re-established simplified behavioral  goals.  For recommendations and goals, see Patient Instructions.    Follow-up: Telehealth appt in 5 weeks.   Katherine Cruz,Katherine Cruz

## 2019-11-23 ENCOUNTER — Ambulatory Visit (INDEPENDENT_AMBULATORY_CARE_PROVIDER_SITE_OTHER): Payer: 59 | Admitting: Family Medicine

## 2019-11-23 ENCOUNTER — Other Ambulatory Visit: Payer: Self-pay

## 2019-11-23 DIAGNOSIS — M797 Fibromyalgia: Secondary | ICD-10-CM

## 2019-11-23 DIAGNOSIS — F329 Major depressive disorder, single episode, unspecified: Secondary | ICD-10-CM | POA: Diagnosis not present

## 2019-11-23 DIAGNOSIS — F5001 Anorexia nervosa, restricting type: Secondary | ICD-10-CM | POA: Diagnosis not present

## 2019-11-23 NOTE — Progress Notes (Signed)
Telehealth Encounter  (Text link to [phone] )  Therapist TBD Med's Management Noemi Chapel, APMHNP-BC Pulmonologist Marshell Garfinkel, MD Cardiologist Adrian Prows, MD I connected with Lanyia Jewel (MRN [id number]) on 11/23/2019 by MyChart video-enabled, HIPAA-compliant telemedicine application, verified that I am speaking with the correct person using two identifiers, and that the patient was in a private environment conducive to confidentiality.  The patient agreed to proceed.  Provider was Kennith Center, PhD, RD, LDN, CEDRD Provider(s) located at [facility] during this telehealth encounter; patient was at home.  Appt start time: 1400 end time: 1500 (1 hour)  Reason for telehealth visit: Medical Nutrition Therapy for MNT for food and eating behaviors related to anxiety.  Originally referred for anorexia nervosa by therapist [name], LPCA, [facility].    Relevant history/background: Jazzy has been extremely anxious, staying home all the time b/c of worries about COVID-19.  The skin condition that developed on her feet several months ago has resolved recently, but she has still not started walking b/c of social anxiety.    Assessment: Niveah has still been struggling with depression, and has been suffering worse pain related to her fibro.  She did enroll in chair yoga and mat yoga and tai chi class at the [facility] and [facility].  She has not enjoyed the tai chi, so will probably not continue.  Has still not heard back from her insurance agent about continuing with her therapist [name].   Recent eating pattern: 3 meals and 1-2 snacks per day.  Eating a lot of beans for protein.   Recent physical activity: 45-minute chair yoga and 45-minute mat yoga and 45-minute tai chi class.  Has not started walking b/c her neighborhood is not conducive to walking.   Recent social activity: Has not connected with many friends or family; spends time working on  art, which currently includes making a very detailed dollhouse.   Sleep:  Estimates she is getting ~7 hours per night. 24-hr recall:  (Up at 8 AM) B (9 AM)-  1 PB&J sandwich, 1 banana, 1 c coffee, 1 tbsp soy milk  Snk ( AM)-  --- L (2 PM)-  1 large salad, ~4 tbsp drsng, 1/2 c blk beans, 1 slc bread, 1 c suja (veg juice)  Snk ( PM)-  --- D (7 PM)-  1 c unsalted mixed nuts Snk ( PM)-  --- Typical day? No.  Usually has normal dinner.  Daughter was visiting and ordered-out food included no vegan or vegetarian foods.    Intervention: Reviewed recent diet and exercise history, confirming behavioral goals, and answering some specific nutrition Qs.    For recommendations and goals, see Patient Instructions.    Follow-up: September.   Chimere Klingensmith,[name]

## 2019-11-23 NOTE — Patient Instructions (Addendum)
Goals 1. Continue your chair yoga and mat yoga classes twice a week.  Walk at least one additional day per week whenever you can.       - Look into other walking clubs.       - Download some good podcasts (such as Hidden Brain) or audiobooks.    2. Eat at least 3 REAL meals (breakfast, lunch, and dinner) and 1-2 snacks per day.  Eat breakfast within 2 hours of getting up.  Aim for no more than 5 hours between eating.   For lunch and dinner, a REAL meal includes at least some protein, some starch, and vegetables and/or fruit.    Remember to prioritize your own need for self-care!  For example, if everyone else is eating a meal that includes animal products, what can you make for yourself?  Document both minutes of walking and nutritionally balanced meals (those that include protein, starch, and veg's).    Follow-up: Call for an appt any time after mid-September:  [phone].

## 2019-11-25 ENCOUNTER — Other Ambulatory Visit: Payer: Self-pay

## 2019-11-25 ENCOUNTER — Encounter: Payer: Self-pay | Admitting: Family Medicine

## 2019-11-25 ENCOUNTER — Ambulatory Visit (INDEPENDENT_AMBULATORY_CARE_PROVIDER_SITE_OTHER): Payer: 59 | Admitting: Family Medicine

## 2019-11-25 VITALS — BP 110/62 | HR 85 | Ht 64.0 in | Wt 122.2 lb

## 2019-11-25 DIAGNOSIS — F5000 Anorexia nervosa, unspecified: Secondary | ICD-10-CM

## 2019-11-25 DIAGNOSIS — Z114 Encounter for screening for human immunodeficiency virus [HIV]: Secondary | ICD-10-CM | POA: Diagnosis not present

## 2019-11-25 DIAGNOSIS — M25552 Pain in left hip: Secondary | ICD-10-CM

## 2019-11-25 DIAGNOSIS — R251 Tremor, unspecified: Secondary | ICD-10-CM

## 2019-11-25 DIAGNOSIS — Z1159 Encounter for screening for other viral diseases: Secondary | ICD-10-CM | POA: Diagnosis not present

## 2019-11-25 DIAGNOSIS — M797 Fibromyalgia: Secondary | ICD-10-CM

## 2019-11-25 DIAGNOSIS — Z13220 Encounter for screening for lipoid disorders: Secondary | ICD-10-CM
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
## 2019-11-25 NOTE — Patient Instructions (Signed)
Go to [facility] for your hip x ray. We will call you with results.  Below is a list of therapists for you to reach out to.  For the tremors, we will continue to monitor the need for beta blockers.   Psychiatry Resource List (Adults and Children) Most of these providers will take Medicaid. please consult your insurance for a complete and updated list of available providers. When calling to make an appointment have your insurance information available to confirm you are covered.  [facility]:   : [address]     [phone]   Sidney Ace: [address] [place],        [phone] [place]: [address],    [phone] Kathryne Sharper: [phone] [place],                   [phone] Children: [facility] [address]         [phone]   [facility]  (Psychiatry only; Adults /children 12 and over, will take Medicaid)  [address] Laurell Josephs [address], [place], [place] [address]       [phone]   [facility] (Psychiatry & counseling ; adults & children ; will take Medicaid [address]  [place] [address]   Go on-line to complete referral ( https://www.[email or web address].org/en/make-[email or web address]-referral [phone]    (Spanish therapist)  [facility]  Psychiatry & counseling; Adults and children;  Call Registration prior to scheduling an appointment [phone] [address]    [place], [place] [address]    [phone]  [facility] (Psychiatry & counseling; adults & children; Medicare no Medicaid)  [address]   [place], [place]  [address]      [phone]    [facility] (up to age 74)  Psychiatry & counseling ,will take Medicaid, must do counseling to receive psychiatry services  [address] [place] [address]        [phone]  [facility] (Psychiatry  & counseling; adults & children; will take Medicaid) Will need a referral from provider [address],  [place], [place]  [phone]  [facility]---  Walk-in Mon-Fri, 8:30-5:00 (will take Medicaid)  [address], [place], [place]  [phone]  To schedule an appointment call [phone]  [facility] --- Walk-In Mon-Friday 8am-3pm ( will take Medicaid, Psychiatry, Adults & children,  [address] [place], [place]   [phone]   [facility] [place]--, Walk-in M-F 8am-12pm and 1pm -3pm   (Counseling, Psychiatry, will take Medicaid, adults & children)  [address] [place], [place]  [phone]      Therapy and Counseling Resources Most providers on this list will take Medicaid. Patients with commercial insurance or Medicare should contact their insurance company to get a list of in network providers.  [employer]  [address] [place], [place] [address]      [phone]  [facility] [address]  [place], [place] [address] [phone]  [facility] [address]  [place], [place] [address]       [phone]      Jovita Kussmaul [facility] [address] [place], [place] [phone]  [facility]:  [address] [place] [phone]  [facility]:  [address] [place]yn Skeeters, [place] [phone]  [facility] (under & uninsured) [address]   [place] [phone]    [email or web address]    [facility] [place] -[address]     Phone:  [phone]     [facility]-  [address]  [phone]   [facility] [address]      [place], [place] [phone]  [facility]: [address] [place], [place], [place]  [phone]   [facility] (Spanish therapist) [address]   [place] [address]    [phone]    [facility]   [address],  [place], [place]  [phone]   [facility]  [address] [place]  [phone]  [facility]  [address] [place], [place]        [phone]  [facility] under & uninsured  [place], To schedule an appointment call [phone] [address], [place] [phone]):  Macario Golds from 8 AM - 3 PM Moving June 1 to [employer] at [facility] [address]  [facility] [address],  (Spanish)   [address], [place]: [phone]) 8:30 - 12; 1 - 2:30  [facility] [employer],  [address], [place]    [phone]):8:30 - 12; 2 - 3PM  [facility] [place],  [address]  [place]; [phone]):   Mon - Fri 8 AM - 5 PM  [facility] [address] [place]  MWF 12:30 to 3:00 or call to schedule an appointment  [phone]  Specific Provider options Psychology Today  https://www.psychologytoday.com/us 1. click on find a therapist  2. enter your zip code 3. left side and select or tailor a therapist for your specific need.   [facility] Provider Directory [email or web address]  (Medicaid)   Follow all drop down to find a provider  Social Support program [facility] [place] [phone] or [employer] [address], [place], [place] Recovery support and educational   24- Hour Availability:  .  Marland Kitchen [facility]  . [address] [place], [place] [employer] [phone] Crisis [phone]  . [facility] [phone]  [facility] Service  [phone]   . [facility] [employer]  [phone] (after hours)  . [facility] Alternative/Mobile Crisis   [phone]  . [place] National Suicide Hotline  936-558-4778  (TALK)  . Call 911 or go to emergency room  . [employer]  [phone]);  [place] and [employer]   . [facility]  [phone]); [place], [place], [place], [place], [place], [place], [place]

## 2019-11-26 LAB — LIPID PANEL
Chol/HDL Ratio: 3.2 ratio (ref 0.0–4.4)
Cholesterol, Total: 210 mg/dL — ABNORMAL HIGH (ref 100–199)
HDL: 66 mg/dL (ref >39)
LDL Chol Calc (NIH): 114 mg/dL — ABNORMAL HIGH (ref 0–99)
Triglycerides: 175 mg/dL — ABNORMAL HIGH (ref 0–149)
VLDL Cholesterol Cal: 30 mg/dL (ref 5–40)

## 2019-11-26 LAB — HIV ANTIBODY (ROUTINE TESTING W REFLEX): HIV Screen 4th Generation wRfx: NONREACTIVE

## 2019-11-26 LAB — HEPATITIS C ANTIBODY: Hep C Virus Ab: <0.1 s/co ratio (ref 0.0–0.9)

## 2019-11-28 ENCOUNTER — Encounter: Payer: Self-pay | Admitting: Family Medicine

## 2019-11-28 DIAGNOSIS — R251 Tremor, unspecified: Secondary | ICD-10-CM | POA: Insufficient documentation

## 2019-11-28 NOTE — Assessment & Plan Note (Signed)
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 On physical exam, tremor appears intentional and not present at rest. Very mild tremor with finger to nose testing b/l.  Declines any medication at this time. Patient will continue to monitor and adjust her activities accordingly.

## 2019-11-28 NOTE — Assessment & Plan Note (Addendum)
Practices Yoga daily. Does not want to be on any medication for this. Patient also reports that she is in need of a therapist. Provided patient with list of mental health resources and instructions for psychology today website. PHQ 2 : 1.

## 2019-11-30 ENCOUNTER — Telehealth: Payer: Self-pay | Admitting: Internal Medicine

## 2019-11-30 NOTE — Telephone Encounter (Signed)
New message    Patient saw Dr Rennis Golden in the lipid clinic some time ago.  Her PCP recently ordered a lipid panel (non-fasting).  She want Dr Rennis Golden to look at the results (in epic) and see if he want to order a fasting lipid panel.  She also want Dr Blanchie Dessert input.  Please call

## 2019-11-30 NOTE — Telephone Encounter (Signed)
Called patient, advised that I would send a message to Dr.Hilty and his nurse to advise on any suggestions with the blood work.

## 2019-11-30 NOTE — Telephone Encounter (Signed)
Looks like cholesterol is lower - LDL is <130. Fasting should show lower triglycerides. Not sure if I would make any changes at this point.  Dr Rexene Edison

## 2019-12-01 NOTE — Telephone Encounter (Signed)
Follow Up: ° ° ° ° ° °Pt is returning Julie's call from today. °

## 2019-12-01 NOTE — Telephone Encounter (Signed)
Patient wants to schedule follow-up lipid clinic appt.  Dr Rexene Edison

## 2019-12-01 NOTE — Telephone Encounter (Signed)
Attempted to contact patient- phone rang and rang, and then hung up.

## 2019-12-01 NOTE — Telephone Encounter (Signed)
Pt states that she has been vegan for the past three years. She does have a nutritionist that she has been working with and sees on a regular basis. She feels as if her cholesterol levels should look better than they do considering how closely she is monitoring her diet and exercise. She denies taking any supplements at this time. She would like to se [name] in person so that he can advise on her lipids etc. OV made for 8/27. Pt aware to call back with any new concerns.

## 2019-12-02 ENCOUNTER — Ambulatory Visit (INDEPENDENT_AMBULATORY_CARE_PROVIDER_SITE_OTHER): Payer: 59 | Admitting: Family Medicine

## 2019-12-02 ENCOUNTER — Encounter: Payer: Self-pay | Admitting: Family Medicine

## 2019-12-02 ENCOUNTER — Other Ambulatory Visit: Payer: Self-pay

## 2019-12-02 VITALS — BP 104/70 | HR 80 | Wt 121.2 lb

## 2019-12-02 DIAGNOSIS — Z1231 Encounter for screening mammogram for malignant neoplasm of breast: Secondary | ICD-10-CM | POA: Diagnosis not present

## 2019-12-02 DIAGNOSIS — E782 Mixed hyperlipidemia: Secondary | ICD-10-CM

## 2019-12-02 DIAGNOSIS — Z789 Other specified health status: Secondary | ICD-10-CM | POA: Diagnosis not present

## 2019-12-02 DIAGNOSIS — B351 Tinea unguium: Secondary | ICD-10-CM

## 2019-12-02 MED ORDER — CICLOPIROX 8 % EX SOLN: Freq: Every day | CUTANEOUS | 0 refills | Status: DC

## 2019-12-02 NOTE — Patient Instructions (Signed)
Follow up at your earliest convenience to discuss further topics and concern.

## 2019-12-02 NOTE — Progress Notes (Signed)
    SUBJECTIVE:   CHIEF COMPLAINT / HPI:   Hip Did not get a hip xray and is going to go to the rheumatologist for further work up.   VZV Sister recently had shingles and does not know if she had the chicken pox. Would like to get antibody testing today before obtaining the shingles vaccine.   Toenail fungus  On left great toe and right pinky toe. Has been present for several months. Has been treated for this in the past.   High cholesterol and TG  Following up with lipid clinic.   PERTINENT  PMH / PSH: fibromyalgia, anxiety   OBJECTIVE:   BP 104/70   Pulse 80   Wt 121 lb 3.2 oz (55 kg)   LMP 03/24/2011   SpO2 97%   BMI 20.80 kg/m   General: Well-appearing female, no acute distress  ASSESSMENT/PLAN:   Mixed hyperlipidemia Patient to follow up with lipid clinic.   Onychomycosis Ciclopirox sent to the pharmacy as this is worked for her in the past.  Varicella vaccination status unknown Obtain VZV IgG as patient sister recently was diagnosed with shingles.  Patient does not know if she has been vaccinated or if she is immune to VZV.  Requesting IgG titers today.  Screening mammogram, encounter for Screening mammogram     Next visit  Colonoscopy alternatives Ringing in ears  Urinary incontinence (stress) Pap smear  Melene Plan, MD [facility]

## 2019-12-03 ENCOUNTER — Other Ambulatory Visit: Payer: Self-pay | Admitting: Family Medicine

## 2019-12-03 DIAGNOSIS — N63 Unspecified lump in unspecified breast: Secondary | ICD-10-CM

## 2019-12-03 DIAGNOSIS — Z1231 Encounter for screening mammogram for malignant neoplasm of breast: Secondary | ICD-10-CM

## 2019-12-03 DIAGNOSIS — N644 Mastodynia: Secondary | ICD-10-CM

## 2019-12-03 DIAGNOSIS — Z789 Other specified health status: Secondary | ICD-10-CM | POA: Insufficient documentation

## 2019-12-03 DIAGNOSIS — B351 Tinea unguium: Secondary | ICD-10-CM | POA: Insufficient documentation

## 2019-12-03 DIAGNOSIS — E782 Mixed hyperlipidemia: Secondary | ICD-10-CM | POA: Insufficient documentation

## 2019-12-03 LAB — VARICELLA ZOSTER ANTIBODY, IGG: Varicella zoster IgG: 709 index (ref >165)

## 2019-12-03 NOTE — Assessment & Plan Note (Addendum)
Screening mammogram.

## 2019-12-03 NOTE — Assessment & Plan Note (Signed)
Patient to follow up with lipid clinic.

## 2019-12-03 NOTE — Progress Notes (Signed)
Received call from imaging center who reported that patient was complaining of a nodule in her breast.  She did not have this complaint during the clinic appointment yesterday.  On further chart review, appears that patient has had diagnostic mammograms in the past.  Will place order for diagnostic mammogram.

## 2019-12-03 NOTE — Assessment & Plan Note (Signed)
Obtain VZV IgG as patient sister recently was diagnosed with shingles.  Patient does not know if she has been vaccinated or if she is immune to VZV.  Requesting IgG titers today.

## 2019-12-03 NOTE — Assessment & Plan Note (Signed)
Ciclopirox sent to the pharmacy as this is worked for her in the past.

## 2019-12-08 ENCOUNTER — Ambulatory Visit: Payer: 59 | Admitting: Family Medicine

## 2019-12-09 ENCOUNTER — Ambulatory Visit: Payer: 59 | Admitting: Family Medicine

## 2019-12-13 ENCOUNTER — Telehealth: Payer: Self-pay

## 2019-12-13 NOTE — Telephone Encounter (Signed)
Patient calls nurse line due to concerns for specific diagnoses in mychart. Patient states that there are multiple areas in chart that have diagnosis of anorexia nervosa. Patient states that she does not have this history and has never been diagnosed with an eating disorder. Does not seem to be a diagnosis on problem list, however, it is associated with many nutrition visits. Patient was calling to see if this could be removed from record. I am unsure if we are able to remove retrospectively, however, moving forward, patient would like further discussion on this issue. Patient has scheduled appointment with PCP on 7/28.   Veronda Prude, RN

## 2019-12-15 ENCOUNTER — Ambulatory Visit (INDEPENDENT_AMBULATORY_CARE_PROVIDER_SITE_OTHER): Payer: 59 | Admitting: Family Medicine

## 2019-12-15 ENCOUNTER — Encounter: Payer: Self-pay | Admitting: Family Medicine

## 2019-12-15 ENCOUNTER — Other Ambulatory Visit: Payer: Self-pay

## 2019-12-15 VITALS — BP 104/66 | HR 78 | Ht 64.0 in | Wt 118.6 lb

## 2019-12-15 DIAGNOSIS — M79604 Pain in right leg: Secondary | ICD-10-CM | POA: Diagnosis not present

## 2019-12-15 DIAGNOSIS — N393 Stress incontinence (female) (male): Secondary | ICD-10-CM | POA: Diagnosis not present

## 2019-12-15 DIAGNOSIS — Z23 Encounter for immunization: Secondary | ICD-10-CM | POA: Insufficient documentation

## 2019-12-15 NOTE — Assessment & Plan Note (Addendum)
Patient concerned about shingles.  Will send shingles vaccine to the pharmacy. LVM for patient as send after visit.

## 2019-12-15 NOTE — Assessment & Plan Note (Signed)
Patient reporting stress incontinence.  Seems to have been worsening recently.  She is concerned because she is going to start hiking with friends and does not want wear a pad.  I have recommended that patient see her gynecologist as she is also due for a Pap smear.  She follows at [facility].

## 2019-12-15 NOTE — Assessment & Plan Note (Signed)
Unclear cause of medial leg pain.  Patient is worried about a blood clot, however, reassured patient given other absent signs and symptoms.  No history of back injury or trauma, though pain does seem neuropathic in nature as well versus muscle spasm.  Patient does report knee injury about a month prior to the pain starting but it is unlikely that these are connected. Patient very concerned about the right leg pain and would like to to be seen at sports medicine for further evaluation.

## 2019-12-15 NOTE — Progress Notes (Signed)
    SUBJECTIVE:   CHIEF COMPLAINT / HPI:   Right leg pain  Patient concerned about middle leg. Sometimes burning/pinch, sometimes just pain. Did have a knee issue in her right leg. Pain has been there for two months. Knee injury was about three months ago. Patient reports swelling in the leg with valgus pressure. It did swell and kept her from doing yoga. Soon after that, she started to get pain in her mid leg. No history of back injuries. No recent trauma. Pain occurs everyday with no specific trigger. Pain lasts for 1 minute of so and then goes away. Sometimes 1 - 3 x during the day. No specific timing of pain.   Incontinence  Saw a urologist a few years ago and felt like it was stable. Sometimes it happens when she sneezes. Out of the blue, seems like it loosens up a little bit. More often that previously. Concerned because she is going to start hiking.   Varicella Patient's sister had shingles. She is wondering if she get varicella vaccine just in case.   Ringing in Ears Colonoscopy and fit testing  Pap smear - Sees [facility]   PERTINENT  PMH / PSH: Fibromyalgia, generalized osteoarthritis  OBJECTIVE:   BP 104/66   Pulse 78   Ht 5\' 4"  (1.626 m)   Wt 118 lb 9.6 oz (53.8 kg)   LMP 03/24/2011   SpO2 99%   BMI 20.36 kg/m   General: Well-appearing female, no acute distress.  No abnormal gait.  ASSESSMENT/PLAN:   Leg pain, medial, right Unclear cause of medial leg pain.  Patient is worried about a blood clot, however, reassured patient given other absent signs and symptoms.  No history of back injury or trauma, though pain does seem neuropathic in nature as well versus muscle spasm.  Patient does report knee injury about a month prior to the pain starting but it is unlikely that these are connected. Patient very concerned about the right leg pain and would like to to be seen at sports medicine for further evaluation.   Stress incontinence Patient reporting stress  incontinence.  Seems to have been worsening recently.  She is concerned because she is going to start hiking with friends and does not want wear a pad.  I have recommended that patient see her gynecologist as she is also due for a Pap smear.  She follows at [facility].  Need for shingles vaccine Patient concerned about shingles.  Will send shingles vaccine to the pharmacy. LVM for patient as send after visit.    [facility], MD [facility]

## 2019-12-19 ENCOUNTER — Encounter: Payer: Self-pay | Admitting: Family Medicine

## 2019-12-21 ENCOUNTER — Other Ambulatory Visit: Payer: Self-pay

## 2019-12-21 ENCOUNTER — Ambulatory Visit
Admission: RE | Admit: 2019-12-21 | Discharge: 2019-12-21 | Disposition: A | Discharge status: Home / Self Care | Payer: 59 | Source: Ambulatory Visit | Attending: Family Medicine | Admitting: Family Medicine

## 2019-12-21 DIAGNOSIS — N63 Unspecified lump in unspecified breast: Secondary | ICD-10-CM

## 2019-12-21 DIAGNOSIS — N644 Mastodynia: Secondary | ICD-10-CM

## 2019-12-28 ENCOUNTER — Ambulatory Visit (INDEPENDENT_AMBULATORY_CARE_PROVIDER_SITE_OTHER): Payer: 59 | Admitting: Family Medicine

## 2019-12-28 ENCOUNTER — Other Ambulatory Visit: Payer: Self-pay

## 2019-12-28 VITALS — BP 108/68 | Ht 64.0 in | Wt 118.0 lb

## 2019-12-28 DIAGNOSIS — M79651 Pain in right thigh: Secondary | ICD-10-CM
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
## 2019-12-28 NOTE — Patient Instructions (Signed)
Your exam and ultrasound are reassuring. Do home exercises for your gracilis, adductor muscles most days of the week for the next 4-6 weeks. There's also a possibility this is due to nerve irritation in the area given the frequency and quality (burning) that you get at times. Tylenol, icing only if needed. Follow up with me in 6 weeks or as needed if you're doing well.

## 2019-12-29 ENCOUNTER — Encounter: Payer: Self-pay | Admitting: Family Medicine

## 2020-01-14 ENCOUNTER — Other Ambulatory Visit: Payer: Self-pay

## 2020-01-14 ENCOUNTER — Ambulatory Visit (INDEPENDENT_AMBULATORY_CARE_PROVIDER_SITE_OTHER): Payer: 59 | Admitting: Internal Medicine

## 2020-01-14 ENCOUNTER — Encounter: Payer: Self-pay | Admitting: Internal Medicine

## 2020-01-14 VITALS — BP 104/72 | HR 82 | Ht 64.0 in | Wt 115.2 lb

## 2020-01-14 DIAGNOSIS — Z7189 Other specified counseling: Secondary | ICD-10-CM | POA: Diagnosis not present

## 2020-01-14 DIAGNOSIS — E782 Mixed hyperlipidemia: Secondary | ICD-10-CM

## 2020-01-14 NOTE — Progress Notes (Signed)
LIPID CLINIC CONSULT NOTE  Chief Complaint:  Follow-up dyslipidemia  Primary Care Physician: Melene Plan, MD  Primary Cardiologist:  Yates Decamp, MD  HPI:  Katherine Cruz is a 54 y.o. female who is being seen today for the evaluation of dyslipidemia at the request of Selena Batten Vinnie Langton, MD. Katherine Cruz is a 54 y.o. female who presents today.  This is a pleasant 54 year old female originally from [place] who presents for evaluation of dyslipidemia.  She has no past medical history significant for coronary disease, hypertension, diabetes or other significant cardiovascular risk factors.  Her family history is not significant for first-degree relatives with known coronary disease.  She was referred for evaluation of dyslipidemia in the context of trying to manage her risk with diet and lifestyle factors alone.  She has been followed by Dr. Jacinto Halim at [facility] cardiovascular for cardiovascular risk reduction.  She had a recent coronary artery calcium score which was 0.  She also had a chest CT scan in September 2019 ordered by Dr. Isaiah Serge for follow-up of a pulmonary nodule.  She had a history of smoking in the past.  The CT scan showed no obvious coronary or aortic atherosclerosis/calcification.  She feels well and is asymptomatic.  She had a lipid profile in January 2020 which showed a total cholesterol 220, triglycerides 75, HDL 69 and LDL of 136.  This represents a slight increase in cholesterol from her previous numbers.  She has also been seeing a dietitian who has been working with her on this issue.  Katherine Cruz tells me that she converted to a vegan diet 3 months ago and has been pretty successful at staying on top of that.  She also maintain some activity.  Notes from the dietitian indicate a history of eating disorder and therefore she is being monitored very closely.  She also has a history of fibromyalgia and is concerned about taking a statin due to possible side effects.  01/14/2020  Katherine Cruz  is seen today for follow-up.  Most recently her lipid profile shows continued improvement.  Total cholesterol 210, triglycerides are 175, HDL 66 her LDL is lower at 114.  This is a good improvement over previous LDL of 136.  She follows a strict vegan diet.  She had numerous questions today which I will defer to her PCP regarding vitamins and other possible preventative health measures.  I did recommend that she could consider fish oil given her mildly elevated triglycerides to supplement her diet.  I am reassured by 0 coronary calcium, higher HDL cholesterol and very healthy lifestyle.  With regards to her murmur this is followed by Dr. Jacinto Halim.  PMHx:  Past Medical History:  Diagnosis Date  . Allergic rhinitis   . Breast mass 08/23/2010  . Fibrocystic breast   . Fibromyalgia   . Osteoarthritis   . Ovarian cyst   . PAC (premature atrial contraction)   . Positive PPD   . PVC (premature ventricular contraction)   . UTI (urinary tract infection)     Past Surgical History:  Procedure Laterality Date  . CERVICAL POLYPECTOMY    . MAXILLARY SINUS LIFT    . REPLANTATION THUMB    . TUBAL LIGATION      FAMHx:  Family History  Problem Relation Age of Onset  . Allergies Father   . Heart disease Neg Hx   . Hypertension Neg Hx   . Diabetes Neg Hx   . Cancer Neg Hx  breast or colon  . Stroke Neg Hx   . Colon cancer Neg Hx     SOCHx:   reports that she quit smoking about 6 years ago. Her smoking use included cigarettes. She has a 35.00 pack-year smoking history. She has never used smokeless tobacco. She reports that she does not drink alcohol and does not use drugs.  ALLERGIES:  Allergies  Allergen Reactions  . Latex Itching    ROS: Pertinent items noted in HPI and remainder of comprehensive ROS otherwise negative.  HOME MEDS: No current outpatient medications on file prior to visit.   No current facility-administered medications on file prior to visit.    LABS/IMAGING: No  results found for this or any previous visit (from the past 48 hour(s)). No results found.  LIPID PANEL:    Component Value Date/Time   CHOL 210 (H) 11/25/2019 1748   TRIG 175 (H) 11/25/2019 1748   HDL 66 11/25/2019 1748   CHOLHDL 3.2 11/25/2019 1748   LDLCALC 114 (H) 11/25/2019 1748    WEIGHTS: Wt Readings from Last 3 Encounters:  01/14/20 115 lb 3.2 oz (52.3 kg)  12/28/19 118 lb (53.5 kg)  12/15/19 118 lb 9.6 oz (53.8 kg)    VITALS: BP 104/72   Pulse 82   Ht 5\' 4"  (1.626 m)   Wt 115 lb 3.2 oz (52.3 kg)   LMP 03/24/2011   SpO2 96%   BMI 19.77 kg/m   EXAM: General appearance: alert and no distress Lungs: clear to auscultation bilaterally Heart: regular rate and rhythm, S1, S2 normal and systolic murmur: early systolic 2/6, blowing at apex Extremities: extremities normal, atraumatic, no cyanosis or edema Neurologic: Grossly normal  EKG: Deferred  ASSESSMENT: 1. Dyslipidemia, goal LDL less than 100 2. 0 coronary artery calcium (2019) 3. Murmur of mitral valve prolapse  PLAN: 1.   Katherine Cruz has a dyslipidemia and is near to target LDL less than 100.  She is not currently on therapy and would like to avoid statin or other medications.  She has 0 coronary calcium based on her study in 2019.  I would not consider repeating that for at least 7 years based on current guidelines.  She eats a healthy vegan diet, exercises regularly and is generally very in tune with her health.  I have no further recommendations regarding her lipid profile at this point.  I suspect her HDL is cardioprotective.  She is to follow-up with Dr. 2020 regarding her mitral valve murmur and her PCP regarding questions about vitamin D and other supplements.  Follow-up with me as needed.  Jacinto Halim, MD, [facility], FACP  [place]  [facility]  Medical Director of the [facility] Lipid Disorders &  Cardiovascular Risk Reduction Clinic Diplomate of the American Board of Clinical  Lipidology Attending Cardiologist  Direct Dial: [phone]  Fax: [phone]  Website:  [email or web address] Marquiz Sotelo 01/14/2020, 1:30 PM

## 2020-01-14 NOTE — Patient Instructions (Signed)

## 2020-01-15 ENCOUNTER — Encounter: Payer: Self-pay | Admitting: Internal Medicine

## 2020-01-18 ENCOUNTER — Telehealth: Payer: Self-pay | Admitting: Internal Medicine

## 2020-01-18 NOTE — Telephone Encounter (Signed)
Patient states that her medical history states she has Anorexia Nervosa. She states that this is a misdiagnosis in her chart and wants to know if [name] noticed in her chart that this was one of her diagnosis'. She forgot to mention this at the appointment on 01/14/20 but wants to [name] to know that she does not have an eating disorder and this is not a part of medical history.

## 2020-01-18 NOTE — Telephone Encounter (Signed)
Routed to Dr. Hilty as FYI 

## 2020-01-21 NOTE — Telephone Encounter (Signed)
Follow up   Pt is calling, would like to get a callback to confirm that [name] knows that the Anorexia Nervosa is a misdiagnosis

## 2020-01-21 NOTE — Telephone Encounter (Signed)
Spoke with patient and informed her that I do not see this diagnosis on her chart any longer. She has spoken with the provider who added it to her records. She is concerned as another provider used this diagnosis to associate lab orders with. Apologized for this and explained she may need to consult this provider.

## 2020-02-03 ENCOUNTER — Other Ambulatory Visit: Payer: Self-pay

## 2020-02-03 ENCOUNTER — Ambulatory Visit (INDEPENDENT_AMBULATORY_CARE_PROVIDER_SITE_OTHER): Payer: 59

## 2020-02-03 DIAGNOSIS — T148XXA Other injury of unspecified body region, initial encounter: Secondary | ICD-10-CM | POA: Diagnosis not present

## 2020-02-03 DIAGNOSIS — Z23 Encounter for immunization: Secondary | ICD-10-CM

## 2020-02-03 NOTE — Progress Notes (Signed)
Patient presents to nurse clinic for Tdap vaccination, after having injury to right leg. Patient reports puncturing leg on old car door.   Dr. Pollie Meyer evaluated area and advised to proceed with Tdap vaccination.   Administered in RD, site unremarkable, tolerated injection well.   Strict return precautions given.   Veronda Prude, RN

## 2020-02-17 ENCOUNTER — Other Ambulatory Visit: Payer: Self-pay

## 2020-02-17 ENCOUNTER — Ambulatory Visit (INDEPENDENT_AMBULATORY_CARE_PROVIDER_SITE_OTHER): Payer: 59 | Admitting: Family Medicine

## 2020-02-17 DIAGNOSIS — E782 Mixed hyperlipidemia: Secondary | ICD-10-CM

## 2020-02-17 DIAGNOSIS — F5000 Anorexia nervosa, unspecified: Secondary | ICD-10-CM | POA: Diagnosis not present

## 2020-02-17 DIAGNOSIS — F418 Other specified anxiety disorders: Secondary | ICD-10-CM

## 2020-02-17 DIAGNOSIS — Z713 Dietary counseling and surveillance: Secondary | ICD-10-CM | POA: Diagnosis not present

## 2020-02-17 NOTE — Progress Notes (Signed)
Telehealth Encounter  (Text link to [phone] )  Therapist [facility] Noemi Chapel, APMHNP-BC Pulmonologist Marshell Garfinkel, MD Cardiologist Adrian Prows, MD I connected with Katherine Cruz (MRN [id number]) on 02/17/2020 by MyChart video-enabled, HIPAA-compliant telemedicine application, verified that I am speaking with the correct person using two identifiers, and that the patient was in a private environment conducive to confidentiality.  The patient agreed to proceed.  Provider was Kennith Center, PhD, RD, LDN, CEDRD Provider was located at [facility] during this telehealth encounter; patient was at home.  Appt start time: 1530 end time: 1630 (1 hour)  Reason for telehealth visit: Medical Nutrition Therapy for MNT for food and eating behaviors related to anxiety.  Originally referred for anorexia nervosa by therapist [name], LPCA, [name].    Relevant history/background: [name] has been extremely anxious, staying home all the time b/c of worries about COVID-19.  The skin condition that developed on her feet several months ago has resolved recently, but she has still not started walking b/c of social anxiety.    Assessment: [name] has incorporated a lot more fruits and vegetables to her diet, and is currently pretty content with respect to the choices she is making.  She has determined that 3 full meals is too much for her, so has started  eating a small breakfast, e.g., 1 slice bread and a banana, followed by lunch and dinner.  She is on a 50-monthwaiting list to see a therapist since insurance discontinued covering services of counselor [name].   Recent eating pattern: 2-3 meals and 1-2 snacks per day.   Recent physical activity: erratically doing 45-minute chair yoga, 45-minute mat yoga, and 45-minute tai chi class.  Yard work several days a week over the summer.  Recent social activity: Started with hiking group yesterday, but has not been very  socially active by choice.  Still spends time working on art; will likely do more in winter.   Sleep:  Stable, estimates she is getting ~7 hours per night. 24-hr recall:  (Up at 6:30 AM) B (8 AM)-  1 slc Ezekial bread, 1 tbsp peanut butter, 1 tsp marmalade, 1 banana, 2 c coffee, 1/4 c soy milk Snk ( AM)-  --- Went hiking ~3.5 miles (~60 min) with GOmnic[employer]L (1 PM)-  1 large salad, tofu, garbanzos, blueberries, 1 slc Ezekial bread, oil and vinegar Snk (4:30)-  1/4 c walnuts, 1 [employer] (8 PM)-  1 large salad, tofu, garbanzos, blueberries, 1 slc Ezekial bread, oil and vinegar Snk ( PM)-  2-3 oz red wine (recommended by cardiologist KLyman Bishop Typical day? Yes.      Intervention: Had a lengthy discussion about [name]'s distress over a diagnosis of anorexia nervosa in her medical chart, about which she is adamant she does not have and has never had.  Reviewed recent diet and exercise history, and agreed that [name] consider need for continued MNT.    No recommendations or goals established today.    Follow-up: prn.   [name],[name]

## 2020-02-17 NOTE — Patient Instructions (Addendum)
I'm glad to see you're doing great with current food choices and physical activity.    Your LDL of 114 does not merit significant dietary change in light of your very high HDL (66).  In addition, the triglyceride level of 175, while above the recommended 149 mg/dL, is non-fasting, so is not meaningful in assessing risk, and suggests your fasting triglycerides would be lower.    Give some thought to future needs or interest in nutrition consultation, and you know how to reach me if you decide you'd like an appointment.

## 2020-03-02 ENCOUNTER — Other Ambulatory Visit: Payer: Self-pay

## 2020-03-02 ENCOUNTER — Ambulatory Visit (INDEPENDENT_AMBULATORY_CARE_PROVIDER_SITE_OTHER): Payer: 59 | Admitting: Family Medicine

## 2020-03-02 ENCOUNTER — Encounter: Payer: Self-pay | Admitting: Family Medicine

## 2020-03-02 ENCOUNTER — Ambulatory Visit: Payer: Self-pay

## 2020-03-02 VITALS — BP 116/74 | HR 83 | Ht 64.0 in | Wt 114.0 lb

## 2020-03-02 DIAGNOSIS — M7122 Synovial cyst of popliteal space [Baker], left knee: Secondary | ICD-10-CM | POA: Diagnosis not present

## 2020-03-02 DIAGNOSIS — M25562 Pain in left knee: Secondary | ICD-10-CM | POA: Diagnosis not present

## 2020-03-02 DIAGNOSIS — M797 Fibromyalgia: Secondary | ICD-10-CM | POA: Diagnosis not present

## 2020-03-02 DIAGNOSIS — Z789 Other specified health status: Secondary | ICD-10-CM | POA: Insufficient documentation

## 2020-03-02 NOTE — Assessment & Plan Note (Signed)
History of this for quite some time.  Patient wants to stay off all the type of prescription medications, we discussed different diet changes with patient being a vegan and supplementation of protein as well as over-the-counter natural supplements that could potentially help with some different aches pains and inflammation.  Warned of potential side effects the patient will continue to be as active.  Patient will follow up with me again in 6 weeks and will discuss further
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
## 2020-03-02 NOTE — Assessment & Plan Note (Signed)
Patient did have a Baker's cyst noted today.  Very small overall.  No abnormal vascularity noted on ultrasound today.  We discussed that we could consider further work-up or even aspiration but patient declined because it is not hurting her.  Only when she flexes.  Discussed compression.  Follow-up again in 6 weeks

## 2020-03-02 NOTE — Patient Instructions (Addendum)
Good to see you.   Turmeric 500mg  daily  Tart cherry extract 1200mg  at night Vitamin D 2000 IU daily  Exercises 3x a week Compression Sleeve for knee w activity Protein [email or web address] See me again in 6-8 weeks

## 2020-03-22 ENCOUNTER — Ambulatory Visit: Payer: 59 | Admitting: Family Medicine
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
## 2020-03-30 ENCOUNTER — Ambulatory Visit: Payer: 59 | Admitting: Family Medicine

## 2020-04-20 ENCOUNTER — Ambulatory Visit (INDEPENDENT_AMBULATORY_CARE_PROVIDER_SITE_OTHER): Payer: 59 | Admitting: Family Medicine

## 2020-04-20 ENCOUNTER — Encounter: Payer: Self-pay | Admitting: Family Medicine

## 2020-04-20 ENCOUNTER — Other Ambulatory Visit: Payer: Self-pay

## 2020-04-20 ENCOUNTER — Ambulatory Visit (INDEPENDENT_AMBULATORY_CARE_PROVIDER_SITE_OTHER): Payer: 59

## 2020-04-20 VITALS — BP 104/70 | HR 82 | Ht 64.0 in | Wt 111.0 lb

## 2020-04-20 DIAGNOSIS — M25552 Pain in left hip: Secondary | ICD-10-CM

## 2020-04-20 DIAGNOSIS — M25551 Pain in right hip: Secondary | ICD-10-CM

## 2020-04-20 DIAGNOSIS — M1612 Unilateral primary osteoarthritis, left hip: Secondary | ICD-10-CM | POA: Diagnosis not present

## 2020-04-20 DIAGNOSIS — M7122 Synovial cyst of popliteal space [Baker], left knee: Secondary | ICD-10-CM

## 2020-04-20 DIAGNOSIS — M2550 Pain in unspecified joint: Secondary | ICD-10-CM

## 2020-04-20 DIAGNOSIS — Z789 Other specified health status: Secondary | ICD-10-CM | POA: Diagnosis not present

## 2020-04-20 LAB — COMPREHENSIVE METABOLIC PANEL
ALT: 20 U/L (ref 0–35)
AST: 21 U/L (ref 0–37)
Albumin: 4.5 g/dL (ref 3.5–5.2)
Alkaline Phosphatase: 59 U/L (ref 39–117)
BUN: 16 mg/dL (ref 6–23)
CO2: 33 mEq/L — ABNORMAL HIGH (ref 19–32)
Calcium: 9.5 mg/dL (ref 8.4–10.5)
Chloride: 102 mEq/L (ref 96–112)
Creatinine, Ser: 0.62 mg/dL (ref 0.40–1.20)
GFR: 100.18 mL/min (ref >60.00)
Glucose, Bld: 91 mg/dL (ref 70–99)
Potassium: 3.6 mEq/L (ref 3.5–5.1)
Sodium: 143 mEq/L (ref 135–145)
Total Bilirubin: 0.5 mg/dL (ref 0.2–1.2)
Total Protein: 7 g/dL (ref 6.0–8.3)

## 2020-04-20 LAB — C-REACTIVE PROTEIN: CRP: <1 mg/dL (ref 0.5–20.0)

## 2020-04-20 LAB — CBC WITH DIFFERENTIAL/PLATELET
Basophils Absolute: 0 10*3/uL (ref 0.0–0.1)
Basophils Relative: 0.4 % (ref 0.0–3.0)
Eosinophils Absolute: 0.1 10*3/uL (ref 0.0–0.7)
Eosinophils Relative: 2.7 % (ref 0.0–5.0)
HCT: 41.4 % (ref 36.0–46.0)
Hemoglobin: 13.9 g/dL (ref 12.0–15.0)
Lymphocytes Relative: 39.6 % (ref 12.0–46.0)
Lymphs Abs: 1.5 10*3/uL (ref 0.7–4.0)
MCHC: 33.5 g/dL (ref 30.0–36.0)
MCV: 92.8 fl (ref 78.0–100.0)
Monocytes Absolute: 0.2 10*3/uL (ref 0.1–1.0)
Monocytes Relative: 5.5 % (ref 3.0–12.0)
Neutro Abs: 2 10*3/uL (ref 1.4–7.7)
Neutrophils Relative %: 51.8 % (ref 43.0–77.0)
Platelets: 193 10*3/uL (ref 150.0–400.0)
RBC: 4.47 Mil/uL (ref 3.87–5.11)
RDW: 13.7 % (ref 11.5–15.5)
WBC: 3.9 10*3/uL — ABNORMAL LOW (ref 4.0–10.5)

## 2020-04-20 LAB — SEDIMENTATION RATE: Sed Rate: 3 mm/hr (ref 0–30)

## 2020-04-20 LAB — FERRITIN: Ferritin: 31.2 ng/mL (ref 10.0–291.0)

## 2020-04-20 LAB — T4, FREE: Free T4: 0.9 ng/dL (ref 0.60–1.60)

## 2020-04-20 LAB — VITAMIN B12: Vitamin B-12: 397 pg/mL (ref 211–911)

## 2020-04-20 LAB — IBC PANEL
Iron: 109 ug/dL (ref 42–145)
Saturation Ratios: 30.8 % (ref 20.0–50.0)
Transferrin: 253 mg/dL (ref 212.0–360.0)

## 2020-04-20 LAB — TSH: TSH: 3.23 u[IU]/mL (ref 0.35–4.50)

## 2020-04-20 LAB — URIC ACID: Uric Acid, Serum: 3 mg/dL (ref 2.4–7.0)

## 2020-04-20 LAB — T3, FREE: T3, Free: 3.1 pg/mL (ref 2.3–4.2)
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
## 2020-04-20 NOTE — Patient Instructions (Addendum)
Good to see you  Avoid internal rotation at all cost My [employer] [facility] and xray on your way out See me again in 6 weeks

## 2020-04-20 NOTE — Assessment & Plan Note (Signed)
Patient states not bothering her at this time.

## 2020-04-20 NOTE — Assessment & Plan Note (Signed)
Patient did have x-rays in 2017.  Focus more on this today and patient does have limited internal range of motion of the hip.  New x-rays ordered today to further evaluate.  Patient does have significant arthritic changes of multiple joints and will get laboratory work-up to further evaluate for any autoimmune that could be potentially contributing.  Follow-up with me again 6 weeks.

## 2020-04-20 NOTE — Assessment & Plan Note (Signed)
Concerned with patient's vegan diet and not getting enough protein.  Patient will do a 3-day journal log but I would like to refer patient to nutrition.  Hopefully this will be beneficial.  Encouraged her to eat more needed if possible.

## 2020-04-21 ENCOUNTER — Encounter: Payer: Self-pay | Admitting: Family Medicine

## 2020-04-24 ENCOUNTER — Encounter: Payer: Self-pay | Admitting: Family Medicine

## 2020-04-25 ENCOUNTER — Other Ambulatory Visit: Payer: Self-pay

## 2020-04-25 DIAGNOSIS — Z789 Other specified health status: Secondary | ICD-10-CM

## 2020-04-27 LAB — PTH, INTACT AND CALCIUM
Calcium: 9.4 mg/dL (ref 8.6–10.4)
PTH: 25 pg/mL (ref 14–64)

## 2020-04-27 LAB — VITAMIN D 1,25 DIHYDROXY
Vitamin D 1, 25 (OH)2 Total: 51 pg/mL (ref 18–72)
Vitamin D2 1, 25 (OH)2: 20 pg/mL
Vitamin D3 1, 25 (OH)2: 31 pg/mL

## 2020-04-27 LAB — ANA: Anti Nuclear Antibody (ANA): NEGATIVE

## 2020-04-27 LAB — RHEUMATOID FACTOR: Rheumatoid fact SerPl-aCnc: <14 IU/mL (ref <14)

## 2020-04-27 LAB — CYCLIC CITRUL PEPTIDE ANTIBODY, IGG: Cyclic Citrullin Peptide Ab: <16 UNITS

## 2020-04-27 LAB — CALCIUM, IONIZED: Calcium, Ion: 5.1 mg/dL (ref 4.8–5.6)

## 2020-05-03 NOTE — Progress Notes (Addendum)
Primary Physician/Referring:  Wilber Oliphant, MD  Patient ID: Katherine Cruz, female    DOB: 08-30-1965, 54 y.o.   MRN: [id number]  Chief Complaint  Patient presents with  . Hyperlipidemia  . New Patient (Initial Visit)  . Pre-op Exam    Dental procedure     HPI:    Katherine Cruz  is a 54 y.o. female with history of fibromyalgia, mild hyperlipidemia, tobacco use disorder (30-pack-year history quit in 2014), and mitral valve prolapse.  Patient also has a history of chronic dyspnea evaluated by Dr. Vaughan Browner in 2019 and diagnosed with minimal emphysema.  Coronary calcium score in 2019 was 0.  She does have chronic palpitations suggestive of PVC/PAC.   Patient was last seen in our office on 04/24/2018 by Dr. Einar Gip for a as needed visit to discuss hyperlipidemia.  At this time patient opted to recheck his lipid profile testing and will consider starting simvastatin 20 mg in the evening if lipids were still above goal.  Patient was advised at her last office visit to follow-up in 6 months as she at the time did not have a PCP.  Patient now presents to reestablish care with concerns regarding hyperlipidemia.  Patient continues to have chronic palpitations suggestive of PVC/PAC.  These are stable and unchanged from previous visit.  She eats a strictly vegan diet and avoids carbs.  She is not physically active on a daily basis, but does do yoga and hiking for a few weeks at a time.  However after physical activity she experiences pain in her hips which in combination with her fibromyalgia limits her physical activity.  She does continue to have mild chronic stable dyspnea, which is often associated with emotional stress.  Patient denies chest pain, dizziness, swelling, syncope, near syncope.  She has no history of hypertension or diabetes, or cardiovascular events.  Patient's primary concern today is that she had lipid profile testing done in July 2021, notably not in a fasting state, which revealed  hyperlipidemia. She is seeking advise regarding lipid management. She and Dr. Einar Gip had previously discussed the option of starting Simvastatin 20 mg in the evening if lipids remained above goal. However, patient was concerned statin therapy would exacerbate fibromyalgia, therefore she has never been on statin therapy.   Past Medical History:  Diagnosis Date  . Allergic rhinitis   . Breast mass 08/23/2010  . Fibrocystic breast   . Fibromyalgia   . Osteoarthritis   . Ovarian cyst   . PAC (premature atrial contraction)   . Positive PPD   . PVC (premature ventricular contraction)   . UTI (urinary tract infection)    Past Surgical History:  Procedure Laterality Date  . CERVICAL POLYPECTOMY    . MAXILLARY SINUS LIFT    . REPLANTATION THUMB    . TUBAL LIGATION     Family History  Problem Relation Age of Onset  . Allergies Father   . Heart disease Neg Hx   . Hypertension Neg Hx   . Diabetes Neg Hx   . Cancer Neg Hx        breast or colon  . Stroke Neg Hx   . Colon cancer Neg Hx     Social History   Tobacco Use  . Smoking status: Former Smoker    Packs/day: 1.00    Years: 35.00    Pack years: 35.00    Types: Cigarettes    Quit date: 2015    Years since quitting: 6.9  . Smokeless  tobacco: Never Used  Substance Use Topics  . Alcohol use: No   Marital Status: Divorced   ROS  Review of Systems  Constitutional: Negative for malaise/fatigue and weight gain.  Cardiovascular: Positive for palpitations (Chronic, stable). Negative for chest pain, claudication, dyspnea on exertion, leg swelling, near-syncope, orthopnea, paroxysmal nocturnal dyspnea and syncope.  Respiratory: Positive for shortness of breath (chronic, stable, associated with emotional situations).   Hematologic/Lymphatic: Does not bruise/bleed easily.  Gastrointestinal: Negative for melena.  Neurological: Negative for dizziness and weakness.    Objective  Blood pressure (!) 101/53, pulse 93, resp. rate 16,  height '5\' 4"'  (1.626 m), weight 113 lb (51.3 kg), last menstrual period 03/24/2011, SpO2 99 %.  Vitals with BMI 05/04/2020 04/20/2020 03/02/2020  Height '5\' 4"'  '5\' 4"'  '5\' 4"'   Weight 113 lbs 111 lbs 114 lbs  BMI 19.39 33.82 50.53  Systolic 976 734 193  Diastolic 53 70 74  Pulse 93 82 83      Physical Exam Vitals reviewed.  HENT:     Head: Normocephalic and atraumatic.  Cardiovascular:     Rate and Rhythm: Normal rate and regular rhythm.     Pulses: Intact distal pulses.          Carotid pulses are 2+ on the right side and 2+ on the left side.      Radial pulses are 2+ on the right side and 2+ on the left side.       Femoral pulses are 2+ on the right side and 2+ on the left side.      Popliteal pulses are 2+ on the right side and 2+ on the left side.       Dorsalis pedis pulses are 1+ on the right side and 1+ on the left side.       Posterior tibial pulses are 2+ on the right side and 2+ on the left side.     Heart sounds: S1 normal and S2 normal. Murmur heard.   Crescendo-decrescendo mid to late systolic murmur is present with a grade of 2/6 at the apex. Midsystolic click present.  No gallop.   Pulmonary:     Effort: Pulmonary effort is normal. No respiratory distress.     Breath sounds: No wheezing, rhonchi or rales.  Musculoskeletal:     Right lower leg: No edema.     Left lower leg: No edema.  Neurological:     Mental Status: She is alert.    Laboratory examination:   Recent Labs    04/20/20 1522  NA 143  K 3.6  CL 102  CO2 33*  GLUCOSE 91  BUN 16  CREATININE 0.62  CALCIUM 9.4  9.5   estimated creatinine clearance is 64.3 mL/min (by C-G formula based on SCr of 0.62 mg/dL).  CMP Latest Ref Rng & Units 04/20/2020 04/20/2020 06/01/2012  Glucose 70 - 99 mg/dL - 91 97  BUN 6 - 23 mg/dL - 16 15  Creatinine 0.40 - 1.20 mg/dL - 0.62 0.55  Sodium 135 - 145 mEq/L - 143 139  Potassium 3.5 - 5.1 mEq/L - 3.6 4.2  Chloride 96 - 112 mEq/L - 102 99  CO2 19 - 32 mEq/L - 33(H) 26   Calcium 8.6 - 10.4 mg/dL 9.4 9.5 9.6  Total Protein 6.0 - 8.3 g/dL - 7.0 -  Total Bilirubin 0.2 - 1.2 mg/dL - 0.5 -  Alkaline Phos 39 - 117 U/L - 59 -  AST 0 - 37 U/L - 21 -  ALT 0 - 35 U/L - 20 -   CBC Latest Ref Rng & Units 04/20/2020 01/02/2018 06/01/2012  WBC 4.0 - 10.5 K/uL 3.9(L) 4.6 8.1  Hemoglobin 12.0 - 15.0 g/dL 13.9 13.5 13.9  Hematocrit 36.0 - 46.0 % 41.4 41.1 41.1  Platelets 150.0 - 400.0 K/uL 193.0 214.0 236    Lipid Panel Recent Labs    11/25/19 1748  CHOL 210*  TRIG 175*  LDLCALC 114*  HDL 66  CHOLHDL 3.2    HEMOGLOBIN A1C No results found for: HGBA1C, MPG TSH Recent Labs    04/20/20 1522  TSH 3.23    External labs:  None   Medications and allergies   Allergies  Allergen Reactions  . Latex Itching     No outpatient medications prior to visit.   No facility-administered medications prior to visit.     Radiology:   No results found.  CT Chest 01/16/2018:  1. No evidence of a LEFT LOWER LOBE lung nodule as questioned on the recent chest x-ray. I believe calcification in the costal cartilage of the anterior seventh rib accounts for the chest x-ray finding. 2.  No acute cardiopulmonary disease. Emphysema (ICD10-J43.9).  Cardiac Studies:   Holter monitor 48 hours 01/05/2018:  Normal sinus rhythm.  Rare PVCs.  Rare PACs.  No patient triggered events.  Coronary Calcium Score 12/31/2017: 0. Visible lung fields are clear.   Treadmill exercise stress test 10/07/2016: EKG demonstrates NSR. The patient exercised according to Bruce Protocol, total time recorded 5:05 min achieving max heart rate of 158 bpm, which was 93% of THR for age and 7.05 METS of work. Stress terminated due to dyspnea, fatigue, lightheadedness, and THR met. Normal BP response. There was no ST-T changes of ischemia with exercise stress test. There were no significant arrhythmias. No evidence of ischemia by GXT. Exercise tolerance is markedly reduced for age. Consider evaluation  for non cardiac etiology for dyspnea. Continue preventative therapy.   Echocardiogram 12/28/2014: 1.  Left ventricular cavity is normal in size.  Normal global wall motion.  Calculated EF 60% 2.  Mild mitral regurgitation.  No mitral valve prolapse seen on the study. 3. Mild tricuspid regurgitation.  No evidence of pulmonary hypertension  EKG:   EKG 05/04/2020: Sinus rhythm at a rate of 75 bpm. Normal axis. No evidence of ischemia. Compared to EKG 11/11/2017, no significant change.  Assessment     ICD-10-CM   1. Mixed dyslipidemia  E78.2 EKG 12-Lead    Lipid Panel With LDL/HDL Ratio  2. Mitral valve prolapse  I34.1 EKG 12-Lead    PCV ECHOCARDIOGRAM COMPLETE     There are no discontinued medications.  No orders of the defined types were placed in this encounter.   Recommendations:   Sereena Marando is a 54 y.o. Caucasian female with history of fibromyalgia, mild hyperlipidemia, mitral valve prolapse, tobacco use disorder (30-pack-year history quit in 2014) with coronary calcium score 0 in 2019. She has a history of chronic palpitations suggestive of PVC/PAC. Patient also with chronic dyspnea due to underlying emphysema.   Patient presents to reestablish care with our office, last seen in 2019. Her primary concern today she had lipid profile testing done which revealed hyperlipidemia has continued. Previous lipid profile testing was not in the fasting state, and patient's primary concern is her elevated triglycerides as she has not had this in the past. Although she has had mildly elevated LDL in the past. Will repeat lipid profile testing as her most recent was in July  2021, this will be done in a fasting state.  Patient has previously had mitral valve prolapse documented on echocardiogram. Her exam today does reveal midsystolic click. Last echocardiogram did not note mitral valve prolapse in 2016. We'll repeat echocardiogram at this time.    Patient had several questions regarding diet,  exercise, and cardiac health. All patient's questions and concerns were addressed.   Follow-up in 3 months for chronic palpitations, hyperlipidemia, and echocardiogram results.  During this visit I reviewed and updated: Tobacco history  allergies medication reconciliation  medical history  surgical history  family history  social history.   This was a 35-minute encounter with face-to-face counseling, medical records review, coordination of care, explanation of complex medical issues, complex medical decision making.      Alethia Berthold, PA-C 05/04/2020, 4:39 PM Office: [phone]  Addendum: To clarify in regard to patient's diet, she does not eat excessive carbohydrates. Patient reports a balanced diet with appropriate nutritional components for each meal. She also eats snacks including bananas, whole grain toast with all-fruit jam, a bowl of whole grain cereal.  Also due to vegan diet patient inquired about alternative to fish oil for reduction of triglycerides. I have investigated potential alteratives including niacin and fibrates which could be an option, although not as effective. Will wait to revaluate after repeat lipid panel testing.  LMP has been updated.

## 2020-05-04 ENCOUNTER — Ambulatory Visit: Payer: 59 | Admitting: Student

## 2020-05-04 ENCOUNTER — Encounter: Payer: Self-pay | Admitting: Student

## 2020-05-04 ENCOUNTER — Other Ambulatory Visit: Payer: Self-pay

## 2020-05-04 VITALS — BP 101/53 | HR 93 | Resp 16 | Ht 64.0 in | Wt 113.0 lb

## 2020-05-04 DIAGNOSIS — I341 Nonrheumatic mitral (valve) prolapse: Secondary | ICD-10-CM

## 2020-05-04 DIAGNOSIS — E782 Mixed hyperlipidemia: Secondary | ICD-10-CM

## 2020-05-29 ENCOUNTER — Telehealth: Payer: Self-pay | Admitting: Family Medicine

## 2020-05-29 NOTE — Telephone Encounter (Signed)
We referred patient to [facility] for nutritional advice as she is vegan BUT they will not see her as her BMI is too low.  Patient requesting referral to nutritionist not affiliated with weight loss.

## 2020-05-30 ENCOUNTER — Other Ambulatory Visit: Payer: Self-pay

## 2020-05-30 DIAGNOSIS — Z789 Other specified health status: Secondary | ICD-10-CM

## 2020-05-30 DIAGNOSIS — R636 Underweight: Secondary | ICD-10-CM

## 2020-05-30 NOTE — Telephone Encounter (Signed)
Referral placed. Patient notified. 

## 2020-06-28 LAB — HM PAP SMEAR

## 2020-06-30 ENCOUNTER — Encounter: Payer: Self-pay | Admitting: Family

## 2020-06-30 ENCOUNTER — Other Ambulatory Visit: Payer: Self-pay

## 2020-06-30 ENCOUNTER — Ambulatory Visit (INDEPENDENT_AMBULATORY_CARE_PROVIDER_SITE_OTHER): Payer: 59 | Admitting: Family

## 2020-06-30 VITALS — BP 124/76 | HR 75 | Temp 98.1°F | Ht 64.0 in | Wt 115.0 lb

## 2020-06-30 DIAGNOSIS — R140 Abdominal distension (gaseous): Secondary | ICD-10-CM | POA: Diagnosis not present

## 2020-06-30 DIAGNOSIS — R251 Tremor, unspecified: Secondary | ICD-10-CM | POA: Diagnosis not present

## 2020-06-30 DIAGNOSIS — R413 Other amnesia: Secondary | ICD-10-CM

## 2020-06-30 DIAGNOSIS — K219 Gastro-esophageal reflux disease without esophagitis: Secondary | ICD-10-CM | POA: Diagnosis not present

## 2020-06-30 NOTE — Progress Notes (Signed)
Katherine Cruz is a 55 y.o. female with the following history as recorded in EpicCare:  Patient Active Problem List   Diagnosis Date Noted  . Arthritis of left hip 04/20/2020  . Baker's cyst of knee, left 03/02/2020  . Vegan 03/02/2020  . Stress incontinence 12/15/2019  . Leg pain, medial, right 12/15/2019  . Need for shingles vaccine 12/15/2019  . Mixed hyperlipidemia 12/03/2019  . Onychomycosis 12/03/2019  . Varicella vaccination status unknown 12/03/2019  . Screening mammogram, encounter for 12/03/2019  . Tremor of both hands 11/28/2019  . Fibromyalgia 08/31/2015  . Generalized osteoarthritis of multiple sites 01/03/2014  . Atopic rhinitis 11/24/2009  . MITRAL VALVE PROLAPSE, HX OF 11/24/2009  . History of cardiovascular disorder 11/24/2009    No current outpatient medications on file.   No current facility-administered medications for this visit.    Allergies: Latex  Past Medical History:  Diagnosis Date  . Allergic rhinitis   . Allergy   . Brain fog   . Breast mass 08/23/2010  . Depression   . Eating disorder   . Emphysema of lung (HCC)   . Fibrocystic breast   . Fibromyalgia   . Fibromyalgia   . GERD (gastroesophageal reflux disease)   . Heart murmur   . Hyperlipidemia   . MVP (mitral valve prolapse)   . Osteoarthritis   . Ovarian cyst   . PAC (premature atrial contraction)   . Positive PPD   . PTSD (post-traumatic stress disorder)   . PVC (premature ventricular contraction)   . UTI (urinary tract infection)   . UTI (urinary tract infection)     Past Surgical History:  Procedure Laterality Date  . CERVICAL POLYPECTOMY    . MAXILLARY SINUS LIFT    . REPLANTATION THUMB    . SINUS LIFT WITH BONE GRAFT    . TUBAL LIGATION      Family History  Problem Relation Age of Onset  . Allergies Father   . Alcohol abuse Father   . Arthritis Mother   . Hearing loss Mother   . Mental illness Mother   . Drug abuse Sister   . Mental illness Sister   . Heart disease  Neg Hx   . Hypertension Neg Hx   . Diabetes Neg Hx   . Cancer Neg Hx        breast or colon  . Stroke Neg Hx   . Colon cancer Neg Hx     Social History   Tobacco Use  . Smoking status: Former Smoker    Packs/day: 1.00    Years: 35.00    Pack years: 35.00    Types: Cigarettes    Quit date: 2015    Years since quitting: 7.1  . Smokeless tobacco: Never Used  Substance Use Topics  . Alcohol use: No    Subjective:  Patient presents today as a new patient; needed to establish with new PCP;  Having increased problems with "digestion"/ increased bloating; symptoms present x 3-4 months; prefers not to take any medications; is very healthy eater- vegan; feels that she has actually been able to gain weight;   Has relationship with [facility]- due for OV; mammogram is up to date;   [facility] cardiology every 4-6 months; Dr. Jacinto Halim managing triglycerides;    Objective:  Vitals:   06/30/20 0859  BP: 124/76  Pulse: 75  Temp: 98.1 F (36.7 C)  TempSrc: Oral  SpO2: 98%  Weight: 115 lb (52.2 kg)  Height: 5\' 4"  (1.626  m)    General: Well developed, well nourished, in no acute distress  Skin : Warm and dry.  Head: Normocephalic and atraumatic  Eyes: Sclera and conjunctiva clear; pupils round and reactive to light; extraocular movements intact  Ears: External normal; canals clear; tympanic membranes normal  Oropharynx: Pink, supple. No suspicious lesions  Neck: Supple without thyromegaly, adenopathy  Lungs: Respirations unlabored; clear to auscultation bilaterally without wheeze, rales, rhonchi  CVS exam: normal rate and regular rhythm.  Abdomen: Soft; nontender; nondistended; normoactive bowel sounds; no masses or hepatosplenomegaly  Musculoskeletal: No deformities; no active joint inflammation  Extremities: No edema, cyanosis, clubbing  Vessels: Symmetric bilaterally  Neurologic: Alert and oriented; speech intact; face symmetrical; moves all extremities well; CNII-XII intact  without focal deficit   Assessment:  1. Abdominal bloating   2. Gastroesophageal reflux disease, unspecified whether esophagitis present   3. Occasional tremors   4. Memory changes     Plan:  1. & 2. Patient defers medication; refer to GI for further evaluation; 3. & 4. Refer to neurology;  She will continue with her specialists as scheduled; she will plan to get her Shingrix at her pharmacy; follow-up here as needed otherwise.  This visit occurred during the SARS-CoV-2 public health emergency.  Safety protocols were in place, including screening questions prior to the visit, additional usage of staff PPE, and extensive cleaning of exam room while observing appropriate contact time as indicated for disinfecting solutions.     No follow-ups on file.  Orders Placed This Encounter  Procedures  . Ambulatory referral to Gastroenterology    Referral Priority:   Routine    Referral Type:   Consultation    Referral Reason:   Specialty Services Required    Number of Visits Requested:   1  . Ambulatory referral to Neurology    Referral Priority:   Routine    Referral Type:   Consultation    Referral Reason:   Specialty Services Required    Requested Specialty:   Neurology    Number of Visits Requested:   1    Requested Prescriptions    No prescriptions requested or ordered in this encounter

## 2020-07-07 ENCOUNTER — Ambulatory Visit: Payer: 59 | Admitting: Dietician

## 2020-07-14 ENCOUNTER — Other Ambulatory Visit: Payer: 59

## 2020-07-17 ENCOUNTER — Telehealth: Payer: Self-pay | Admitting: Family

## 2020-07-17 MED ORDER — PANTOPRAZOLE SODIUM 40 MG PO TBEC
40.0000 mg | DELAYED_RELEASE_TABLET | Freq: Every day | ORAL | 1 refills | Status: DC
Start: 2020-07-17 — End: 2020-07-28

## 2020-07-17 NOTE — Telephone Encounter (Signed)
Pt notified that Protonix sent to her pharmacy on file.  Pt states she would like to try natural remedies first.  Pt states she is a vegan & gets heartburn after every meal (regurgitation has subsided); pt advised to keep a food diary & try eating small, frequent meals.  Pt states she will cut back on dates for dessert & determine if protonix needed.

## 2020-07-17 NOTE — Telephone Encounter (Signed)
Patient requesting advice for heartburn while waiting to see GI in April. She states she gets heartburn after every meal

## 2020-07-17 NOTE — Telephone Encounter (Signed)
I will send in Rx for Protonix for her to try taking daily for her symptoms. Please keep planned follow up with GI.

## 2020-07-19 LAB — LIPID PANEL WITH LDL/HDL RATIO
Cholesterol, Total: 193 mg/dL (ref 100–199)
HDL: 71 mg/dL (ref >39)
LDL Chol Calc (NIH): 113 mg/dL — ABNORMAL HIGH (ref 0–99)
LDL/HDL Ratio: 1.6 ratio (ref 0.0–3.2)
Triglycerides: 48 mg/dL (ref 0–149)
VLDL Cholesterol Cal: 9 mg/dL (ref 5–40)

## 2020-07-19 NOTE — Progress Notes (Signed)
Please inform patient triglycerides have significantly improved and are under excellent control.  Her LDL (bad cholesterol) is mildly elevated but in view of high HDL (good cholesterol) would not make any changes at this time.

## 2020-07-20 NOTE — Progress Notes (Signed)
Called patient, NA, LMAM

## 2020-07-20 NOTE — Progress Notes (Signed)
Attempted to call pt, no answer. Left vm requesting call back.

## 2020-07-21 ENCOUNTER — Ambulatory Visit: Payer: 59 | Admitting: Student

## 2020-07-21 NOTE — Progress Notes (Signed)
Called and spoke with pt regarding lab results. Pt voiced understanding.

## 2020-07-27 NOTE — Progress Notes (Signed)
Primary Physician/Referring:  Marrian Salvage, FNP  Patient ID: Katherine Cruz, female    DOB: 05-14-1966, 55 y.o.   MRN: [id number]  Chief Complaint  Patient presents with   Palpitations   Follow-up    3 month   Results   HPI:    Katherine Cruz  is a 55 y.o. female with history of fibromyalgia, mild hyperlipidemia, tobacco use disorder (30-pack-year history quit in 2014), and mitral valve prolapse.  Patient also has a history of chronic dyspnea evaluated by Dr. Vaughan Browner in 2019 and diagnosed with minimal emphysema.  Coronary calcium score in 2019 was 0.  She does have chronic palpitations suggestive of PVC/PAC.   Patient presents for 3 month follow up of palpitations and hyperlipidemia, as well as to discuss results of echocardiogram. Echocardiogram was performed earlier today, therefore will have to call patient with results of echocardiogram evaluating for MVP. Patient's repeat lipid profile testing reveled lipids well controlled. Notably LDL is minimally elevated, but with high HDL would not recommend statin therapy at this time.  Patient's primary concern today is that she has been having symptoms concerning for GERD since our last visit.  She also reports palpitations correlating with the symptoms.  She will be seeing GI provider at the end of April for further evaluation.  Patient does express that she continues to feel overall fatigued.   Past Medical History:  Diagnosis Date   Allergic rhinitis    Allergy    Brain fog    Breast mass 08/23/2010   Depression    Eating disorder    Emphysema of lung (HCC)    Fibrocystic breast    Fibromyalgia    Fibromyalgia    GERD (gastroesophageal reflux disease)    Heart murmur    Hyperlipidemia    MVP (mitral valve prolapse)    Osteoarthritis    Ovarian cyst    PAC (premature atrial contraction)    Positive PPD    PTSD (post-traumatic stress disorder)    PVC (premature ventricular contraction)    UTI  (urinary tract infection)    UTI (urinary tract infection)    Past Surgical History:  Procedure Laterality Date   CERVICAL POLYPECTOMY     MAXILLARY SINUS LIFT     REPLANTATION THUMB     SINUS LIFT WITH BONE GRAFT     TUBAL LIGATION     Family History  Problem Relation Age of Onset   Allergies Father    Alcohol abuse Father    Arthritis Mother    Hearing loss Mother    Mental illness Mother    Drug abuse Sister    Mental illness Sister    Heart disease Neg Hx    Hypertension Neg Hx    Diabetes Neg Hx    Cancer Neg Hx        breast or colon   Stroke Neg Hx    Colon cancer Neg Hx     Social History   Tobacco Use   Smoking status: Former Smoker    Packs/day: 1.00    Years: 35.00    Pack years: 35.00    Types: Cigarettes    Quit date: 2015    Years since quitting: 7.1   Smokeless tobacco: Never Used  Substance Use Topics   Alcohol use: No   Marital Status: Divorced   ROS  Review of Systems  Constitutional: Negative for weight gain. Positive for fatigue. Cardiovascular: Positive for palpitations (Chronic). Negative for chest pain, claudication, dyspnea on  exertion, leg swelling, near-syncope, orthopnea, paroxysmal nocturnal dyspnea and syncope.  Respiratory: Positive for shortness of breath (chronic, associated with emotional situations).   Hematologic/Lymphatic: Does not bruise/bleed easily.  Gastrointestinal: Negative for melena.  Neurological: Negative for dizziness and weakness.    Objective  Blood pressure 101/62, pulse 67, temperature 97.9 F (36.6 C), temperature source Temporal, resp. rate 17, height '5\' 4"'  (1.626 m), weight 110 lb 9.6 oz (50.2 kg), last menstrual period 03/24/2015, SpO2 100 %.  Vitals with BMI 07/28/2020 06/30/2020 05/04/2020  Height '5\' 4"'  '5\' 4"'  '5\' 4"'   Weight 110 lbs 10 oz 115 lbs 113 lbs  BMI 18.98 30.16 01.09  Systolic 323 557 322  Diastolic 62 76 53  Pulse 67 75 93      Physical Exam Vitals reviewed.  HENT:      Head: Normocephalic and atraumatic.  Cardiovascular:     Rate and Rhythm: Normal rate and regular rhythm.     Pulses: Intact distal pulses.          Carotid pulses are 2+ on the right side and 2+ on the left side.      Radial pulses are 2+ on the right side and 2+ on the left side.       Femoral pulses are 2+ on the right side and 2+ on the left side.      Popliteal pulses are 2+ on the right side and 2+ on the left side.       Dorsalis pedis pulses are 1+ on the right side and 1+ on the left side.       Posterior tibial pulses are 2+ on the right side and 2+ on the left side.     Heart sounds: S1 normal and S2 normal. Murmur heard.   Crescendo-decrescendo mid to late systolic murmur is present with a grade of 2/6 at the apex. Midsystolic click present.  No gallop.   Pulmonary:     Effort: Pulmonary effort is normal. No respiratory distress.     Breath sounds: No wheezing, rhonchi or rales.  Musculoskeletal:     Right lower leg: No edema.     Left lower leg: No edema.  Neurological:     Mental Status: She is alert.   Exam unchanged compared to previous visit.  Laboratory examination:   Recent Labs    04/20/20 1522  NA 143  K 3.6  CL 102  CO2 33*  GLUCOSE 91  BUN 16  CREATININE 0.62  CALCIUM 9.4   9.5   CrCl cannot be calculated (Patient's most recent lab result is older than the maximum 21 days allowed.).  CMP Latest Ref Rng & Units 04/20/2020 04/20/2020 06/01/2012  Glucose 70 - 99 mg/dL - 91 97  BUN 6 - 23 mg/dL - 16 15  Creatinine 0.40 - 1.20 mg/dL - 0.62 0.55  Sodium 135 - 145 mEq/L - 143 139  Potassium 3.5 - 5.1 mEq/L - 3.6 4.2  Chloride 96 - 112 mEq/L - 102 99  CO2 19 - 32 mEq/L - 33(H) 26  Calcium 8.6 - 10.4 mg/dL 9.4 9.5 9.6  Total Protein 6.0 - 8.3 g/dL - 7.0 -  Total Bilirubin 0.2 - 1.2 mg/dL - 0.5 -  Alkaline Phos 39 - 117 U/L - 59 -  AST 0 - 37 U/L - 21 -  ALT 0 - 35 U/L - 20 -   CBC Latest Ref Rng & Units 04/20/2020 01/02/2018 06/01/2012  WBC 4.0 -  10.5 K/uL 3.9(L)  4.6 8.1  Hemoglobin 12.0 - 15.0 g/dL 13.9 13.5 13.9  Hematocrit 36.0 - 46.0 % 41.4 41.1 41.1  Platelets 150.0 - 400.0 K/uL 193.0 214.0 236    Lipid Panel Recent Labs    11/25/19 1748 07/18/20 0916  CHOL 210* 193  TRIG 175* 48  LDLCALC 114* 113*  HDL 66 71  CHOLHDL 3.2  --     HEMOGLOBIN A1C No results found for: HGBA1C, MPG TSH Recent Labs    04/20/20 1522  TSH 3.23    External labs:  None   Medications and allergies   Allergies  Allergen Reactions   Latex Itching     Outpatient Medications Prior to Visit  Medication Sig Dispense Refill   pantoprazole (PROTONIX) 40 MG tablet Take 1 tablet (40 mg total) by mouth daily. 30 tablet 1   No facility-administered medications prior to visit.     Radiology:   No results found.  CT Chest 01/16/2018:  1. No evidence of a LEFT LOWER LOBE lung nodule as questioned on the recent chest x-ray. I believe calcification in the costal cartilage of the anterior seventh rib accounts for the chest x-ray finding. 2.  No acute cardiopulmonary disease. Emphysema (ICD10-J43.9).  Cardiac Studies:   Echocardiogram: Pending  Holter monitor 48 hours 01/05/2018:  Normal sinus rhythm.  Rare PVCs.  Rare PACs.  No patient triggered events.  Coronary Calcium Score 12/31/2017: 0. Visible lung fields are clear.   Treadmill exercise stress test 10/07/2016: EKG demonstrates NSR. The patient exercised according to Bruce Protocol, total time recorded 5:05 min achieving max heart rate of 158 bpm, which was 93% of THR for age and 7.05 METS of work. Stress terminated due to dyspnea, fatigue, lightheadedness, and THR met. Normal BP response. There was no ST-T changes of ischemia with exercise stress test. There were no significant arrhythmias. No evidence of ischemia by GXT. Exercise tolerance is markedly reduced for age. Consider evaluation for non cardiac etiology for dyspnea. Continue preventative therapy.   Echocardiogram  12/28/2014: 1.  Left ventricular cavity is normal in size.  Normal global wall motion.  Calculated EF 60% 2.  Mild mitral regurgitation.  No mitral valve prolapse seen on the study. 3. Mild tricuspid regurgitation.  No evidence of pulmonary hypertension  EKG:   EKG 05/04/2020: Sinus rhythm at a rate of 75 bpm. Normal axis. No evidence of ischemia. Compared to EKG 11/11/2017, no significant change.  Assessment     ICD-10-CM   1. Mixed dyslipidemia  E78.2   2. Mitral valve prolapse  I34.1   3. Palpitations  R00.2      Medications Discontinued During This Encounter  Medication Reason   pantoprazole (PROTONIX) 40 MG tablet Error    No orders of the defined types were placed in this encounter.   Recommendations:   Katherine Cruz is a 55 y.o. Caucasian female with history of fibromyalgia, mild hyperlipidemia, mitral valve prolapse, tobacco use disorder (30-pack-year history quit in 2014) with coronary calcium score 0 in 2019. She has a history of chronic palpitations suggestive of PVC/PAC. Patient also with chronic dyspnea due to underlying emphysema.   Patient presents for 3 month follow up of palpitations and hyperlipidemia, as well as to discuss results of echocardiogram. Echocardiogram was performed earlier today, therefore will have to call patient with results of echocardiogram evaluating for MVP. Patient's repeat lipid profile testing reveled lipids well controlled. Notably LD is minimally elevated, but with high HDL would not recommend statin therapy at this time.  In regard to patient's symptoms suggestive of GERD, will defer further management and evaluation to GI provider.  However patient's palpitations do seem to correlate with exacerbation of likely GERD symptoms.  We will continue to monitor this closely.  If GI work-up is negative could consider further cardiac evaluation.  Patient also expresses concern today regarding fatty liver disease, evaluation of which I would also defer to  GI.  We will continue watchful waiting of palpitations.  We will notify her when we have results of echocardiogram regarding mitral valve prolapse.  Otherwise we will continue to follow peripherally as she undergoes GI evaluation.  Follow-up in 3 months, sooner if needed, for palpitations and fatigue.   Alethia Berthold, PA-C 07/28/2020, 1:08 PM Office: [phone]

## 2020-07-28 ENCOUNTER — Other Ambulatory Visit: Payer: Self-pay

## 2020-07-28 ENCOUNTER — Ambulatory Visit (INDEPENDENT_AMBULATORY_CARE_PROVIDER_SITE_OTHER): Payer: 59 | Admitting: Family

## 2020-07-28 ENCOUNTER — Ambulatory Visit: Payer: 59 | Admitting: Student

## 2020-07-28 ENCOUNTER — Ambulatory Visit: Payer: 59

## 2020-07-28 ENCOUNTER — Encounter: Payer: Self-pay | Admitting: Student

## 2020-07-28 ENCOUNTER — Encounter: Payer: Self-pay | Admitting: Family

## 2020-07-28 VITALS — BP 110/66 | HR 70 | Ht 64.0 in | Wt 112.0 lb

## 2020-07-28 VITALS — BP 101/62 | HR 67 | Temp 97.9°F | Resp 17 | Ht 64.0 in | Wt 110.6 lb

## 2020-07-28 DIAGNOSIS — E782 Mixed hyperlipidemia: Secondary | ICD-10-CM

## 2020-07-28 DIAGNOSIS — R140 Abdominal distension (gaseous): Secondary | ICD-10-CM | POA: Diagnosis not present

## 2020-07-28 DIAGNOSIS — I341 Nonrheumatic mitral (valve) prolapse: Secondary | ICD-10-CM

## 2020-07-28 DIAGNOSIS — R002 Palpitations: Secondary | ICD-10-CM

## 2020-07-28 NOTE — Progress Notes (Signed)
Katherine Cruz is a 55 y.o. female with the following history as recorded in [employer]:  Patient Active Problem List   Diagnosis Date Noted  . Arthritis of left hip 04/20/2020  . Baker's cyst of knee, left 03/02/2020  . Vegan 03/02/2020  . Stress incontinence 12/15/2019  . Leg pain, medial, right 12/15/2019  . Need for shingles vaccine 12/15/2019  . Mixed hyperlipidemia 12/03/2019  . Onychomycosis 12/03/2019  . Varicella vaccination status unknown 12/03/2019  . Screening mammogram, encounter for 12/03/2019  . Tremor of both hands 11/28/2019  . Fibromyalgia 08/31/2015  . Generalized osteoarthritis of multiple sites 01/03/2014  . Atopic rhinitis 11/24/2009  . MITRAL VALVE PROLAPSE, HX OF 11/24/2009  . History of cardiovascular disorder 11/24/2009    No current outpatient medications on file.   No current facility-administered medications for this visit.    Allergies: Latex  Past Medical History:  Diagnosis Date  . Allergic rhinitis   . Allergy   . Brain fog   . Breast mass 08/23/2010  . Depression   . Eating disorder   . Emphysema of lung (HCC)   . Fibrocystic breast   . Fibromyalgia   . Fibromyalgia   . GERD (gastroesophageal reflux disease)   . Heart murmur   . Hyperlipidemia   . MVP (mitral valve prolapse)   . Osteoarthritis   . Ovarian cyst   . PAC (premature atrial contraction)   . Positive PPD   . PTSD (post-traumatic stress disorder)   . PVC (premature ventricular contraction)   . UTI (urinary tract infection)   . UTI (urinary tract infection)     Past Surgical History:  Procedure Laterality Date  . CERVICAL POLYPECTOMY    . MAXILLARY SINUS LIFT    . REPLANTATION THUMB    . SINUS LIFT WITH BONE GRAFT    . TUBAL LIGATION      Family History  Problem Relation Age of Onset  . Allergies Father   . Alcohol abuse Father   . Arthritis Mother   . Hearing loss Mother   . Mental illness Mother   . Drug abuse Sister   . Mental illness Sister   . Heart disease  Neg Hx   . Hypertension Neg Hx   . Diabetes Neg Hx   . Cancer Neg Hx        breast or colon  . Stroke Neg Hx   . Colon cancer Neg Hx     Social History   Tobacco Use  . Smoking status: Former Smoker    Packs/day: 1.00    Years: 35.00    Pack years: 35.00    Types: Cigarettes    Quit date: 2015    Years since quitting: 7.1  . Smokeless tobacco: Never Used  Substance Use Topics  . Alcohol use: No    Subjective:  Patient is requesting referral to allergist- is concerned that there is an underlying food allergy; having abdominal bloating/ GERD- Protonix was prescribed but patient does not want to take any medication; notes that bloating has been presents for "months to years" but seems to be worsening lately; notes that bowel movements are normal;   Scheduled next week to see GYN for pap smear/ exam;      Objective:  Vitals:   07/28/20 1350  BP: 110/66  Pulse: 70  SpO2: 95%  Weight: 112 lb (50.8 kg)  Height: 5\' 4"  (1.626 m)    General: Well developed, well nourished, in no acute distress  Skin :  Warm and dry.  Head: Normocephalic and atraumatic  Lungs: Respirations unlabored;  Abdomen: Soft; nontender; nondistended; Neurologic: Alert and oriented; speech intact; face symmetrical; moves all extremities well; CNII-XII intact without focal deficit   Assessment:  1. Abdominal bloating     Plan:  Keep planned follow-up with GYN and GI; agree allergist referral is appropriate; update CT as well- want to rule out pelvic source of symptoms; follow-up to be determined;  This visit occurred during the SARS-CoV-2 public health emergency.  Safety protocols were in place, including screening questions prior to the visit, additional usage of staff PPE, and extensive cleaning of exam room while observing appropriate contact time as indicated for disinfecting solutions.     No follow-ups on file.  Orders Placed This Encounter  Procedures  . CT Abdomen Pelvis W Contrast     Standing Status:   Future    Standing Expiration Date:   07/28/2021    Order Specific Question:   If indicated for the ordered procedure, I authorize the administration of contrast media per Radiology protocol    Answer:   Yes    Order Specific Question:   Is patient pregnant?    Answer:   No    Order Specific Question:   Preferred imaging location?    Answer:   [id number]    Order Specific Question:   Is Oral Contrast requested for this exam?    Answer:   Yes, Per Radiology protocol  . Ambulatory referral to Allergy    Referral Priority:   Routine    Referral Type:   Allergy Testing    Referral Reason:   Specialty Services Required    Requested Specialty:   Allergy    Number of Visits Requested:   1    Requested Prescriptions    No prescriptions requested or ordered in this encounter

## 2020-07-31 NOTE — Progress Notes (Signed)
Called and spoke to pt regarding echo results. Pt voiced understanding.

## 2020-07-31 NOTE — Progress Notes (Signed)
Please inform patient echo showed normal pumping activity. Mild MR and mild TR unchanged from previous. Overall echocardiogram is stable. Will await further evaluation by GI.

## 2020-08-25 ENCOUNTER — Ambulatory Visit: Payer: 59 | Admitting: Dietician

## 2020-08-29 ENCOUNTER — Ambulatory Visit: Payer: 59 | Admitting: Allergy
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
## 2020-09-07 ENCOUNTER — Ambulatory Visit: Payer: 59 | Admitting: Gastroenterology

## 2020-09-15 ENCOUNTER — Ambulatory Visit: Payer: 59 | Admitting: Dietician

## 2020-09-28 ENCOUNTER — Ambulatory Visit: Payer: 59 | Admitting: Allergy

## 2020-10-26 NOTE — Progress Notes (Signed)
Primary Physician/Referring:  Marrian Salvage, FNP  Patient ID: Katherine Cruz, female    DOB: 04/18/1966, 55 y.o.   MRN: [id number]  Chief Complaint  Patient presents with   Hyperlipidemia   Palpitations   Follow-up    3 month    HPI:    Katherine Cruz  is a 55 y.o. female with history of fibromyalgia, mild hyperlipidemia, tobacco use disorder (30-pack-year history quit in 2014), and mitral valve prolapse.  Patient also has a history of chronic dyspnea evaluated by Dr. Vaughan Browner in 2019 and diagnosed with minimal emphysema.  Coronary calcium score in 2019 was 0.  She does have chronic palpitations suggestive of PVC/PAC.   Patient presents for 19-monthfollow-up of palpitations and fatigue.  At last visit felt symptoms related to underlying GI issues. Patient reports that over the last 3 months she has been struggling with significant mental health issues and therefore has not followed up with GI, neurology, or established with a new PCP.  [place] [facility] [place].  Patient states she will begin treatment in the next 2 weeks.  Patient reports she has been under a significant amount of stress as of late.  She is also significant bilateral hip and lower extremity pain likely secondary to fibromyalgia.  Patient was last seen by rheumatologist 4 years ago, however she would like to reestablish with rheumatologic care at this time.  In regard to palpitations, patient states she has approximately 4 very brief episodes daily.  Denies associated symptoms.  Patient states this is relatively well controlled compared to history of chronic palpitations.  Denies chest pain, dyspnea, syncope, near syncope.  Patient does report occasional episodes of dizziness as well as which she describes as "flash of bluelight".  Past Medical History:  Diagnosis Date   Allergic rhinitis    Allergy    Brain fog     Breast mass 08/23/2010   Depression    Eating disorder    Emphysema of lung (HCC)    Fibrocystic breast    Fibromyalgia    Fibromyalgia    GERD (gastroesophageal reflux disease)    Heart murmur    Hyperlipidemia    MVP (mitral valve prolapse)    Osteoarthritis    Ovarian cyst    PAC (premature atrial contraction)    Positive PPD    PTSD (post-traumatic stress disorder)    PVC (premature ventricular contraction)    UTI (urinary tract infection)    UTI (urinary tract infection)    Past Surgical History:  Procedure Laterality Date   CERVICAL POLYPECTOMY     MAXILLARY SINUS LIFT     REPLANTATION THUMB     SINUS LIFT WITH BONE GRAFT     TUBAL LIGATION     Family History  Problem Relation Age of Onset   Allergies Father    Alcohol abuse Father    Arthritis Mother    Hearing loss Mother    Mental illness Mother    Drug abuse Sister    Mental illness Sister    Heart disease Neg Hx    Hypertension Neg Hx    Diabetes Neg Hx    Cancer Neg Hx        breast or colon   Stroke Neg Hx    Colon cancer Neg Hx     Social History   Tobacco Use   Smoking status: Former    Packs/day: 1.00  Years: 35.00    Pack years: 35.00    Types: Cigarettes    Quit date: 2015    Years since quitting: 7.4   Smokeless tobacco: Never  Substance Use Topics   Alcohol use: No   Marital Status: Divorced   ROS  Review of Systems  Constitutional: Negative.   HENT: Negative.    Respiratory:  Positive for shortness of breath (chronic, associated with emotional siuations).   Cardiovascular:  Positive for palpitations (chronic). Negative for chest pain, orthopnea, leg swelling and PND.  Gastrointestinal:  Positive for heartburn.  Genitourinary: Negative.   Musculoskeletal:  Positive for myalgias.  Skin: Negative.   Neurological:  Positive for dizziness (occasional). Negative for focal weakness.     Objective  Blood pressure 111/62, pulse 69, temperature 98.2 F (36.8 C), temperature source  Temporal, resp. rate 17, height '5\' 4"'  (1.626 m), weight 111 lb 9.6 oz (50.6 kg), last menstrual period 03/24/2015, SpO2 98 %.  Vitals with BMI 10/27/2020 07/28/2020 07/28/2020  Height '5\' 4"'  '5\' 4"'  '5\' 4"'   Weight 111 lbs 10 oz 112 lbs 110 lbs 10 oz  BMI 19.15 44.96 75.91  Systolic 638 466 599  Diastolic 62 66 62  Pulse 69 70 67   Physical Exam Vitals reviewed.  Constitutional:      General: She is not in acute distress.    Appearance: She is normal weight.  Neck:     Vascular: No carotid bruit.  Cardiovascular:     Rate and Rhythm: Normal rate and regular rhythm.     Pulses:          Carotid pulses are 2+ on the right side and 2+ on the left side.      Radial pulses are 2+ on the right side and 2+ on the left side.       Femoral pulses are 2+ on the right side and 2+ on the left side.      Popliteal pulses are 2+ on the right side and 2+ on the left side.       Dorsalis pedis pulses are 1+ on the right side and 1+ on the left side.       Posterior tibial pulses are 2+ on the right side and 2+ on the left side.     Heart sounds: Murmur heard.  Systolic murmur is present with a grade of 2/6.     Comments: Crescendo-decrescendo mid to late systolic murmur is present with a grade of 2/6 at the apex. Midsystolic click present.  Pulmonary:     Effort: Pulmonary effort is normal.     Breath sounds: Normal breath sounds.  Neurological:     Mental Status: She is alert.    Laboratory examination:   Recent Labs    04/20/20 1522  NA 143  K 3.6  CL 102  CO2 33*  GLUCOSE 91  BUN 16  CREATININE 0.62  CALCIUM 9.4  9.5   CrCl cannot be calculated (Patient's most recent lab result is older than the maximum 21 days allowed.).  CMP Latest Ref Rng & Units 04/20/2020 04/20/2020 06/01/2012  Glucose 70 - 99 mg/dL - 91 97  BUN 6 - 23 mg/dL - 16 15  Creatinine 0.40 - 1.20 mg/dL - 0.62 0.55  Sodium 135 - 145 mEq/L - 143 139  Potassium 3.5 - 5.1 mEq/L - 3.6 4.2  Chloride 96 - 112 mEq/L - 102 99   CO2 19 - 32 mEq/L - 33(H) 26  Calcium 8.6 -  10.4 mg/dL 9.4 9.5 9.6  Total Protein 6.0 - 8.3 g/dL - 7.0 -  Total Bilirubin 0.2 - 1.2 mg/dL - 0.5 -  Alkaline Phos 39 - 117 U/L - 59 -  AST 0 - 37 U/L - 21 -  ALT 0 - 35 U/L - 20 -   CBC Latest Ref Rng & Units 04/20/2020 01/02/2018 06/01/2012  WBC 4.0 - 10.5 K/uL 3.9(L) 4.6 8.1  Hemoglobin 12.0 - 15.0 g/dL 13.9 13.5 13.9  Hematocrit 36.0 - 46.0 % 41.4 41.1 41.1  Platelets 150.0 - 400.0 K/uL 193.0 214.0 236    Lipid Panel Recent Labs    11/25/19 1748 07/18/20 0916  CHOL 210* 193  TRIG 175* 48  LDLCALC 114* 113*  HDL 66 71  CHOLHDL 3.2  --     HEMOGLOBIN A1C No results found for: HGBA1C, MPG TSH Recent Labs    04/20/20 1522  TSH 3.23    External labs:  None   Allergies   Allergies  Allergen Reactions   Latex Itching      Medications Prior to Visit:   No outpatient medications prior to visit.   No facility-administered medications prior to visit.    Final Medications at End of Visit    No outpatient medications have been marked as taking for the 10/27/20 encounter (Office Visit) with Rayetta Pigg, Cory Kitt C, PA-C.   Radiology:   No results found.  CT Chest 01/16/2018:  1. No evidence of a LEFT LOWER LOBE lung nodule as questioned on the recent chest x-ray. I believe calcification in the costal cartilage of the anterior seventh rib accounts for the chest x-ray finding. 2.  No acute cardiopulmonary disease.  Emphysema (ICD10-J43.9).  Cardiac Studies:   Echocardiogram 07/28/2020:  Normal LV systolic function with visual EF 55-60%. Left ventricle cavity is normal in size. Normal global wall motion. Normal diastolic filling pattern, normal LAP.  Mild (Grade I) mitral regurgitation.  Mild tricuspid regurgitation. No evidence of pulmonary hypertension.  IVC is dilated with a respiratory response of <50%.  Compared to prior study dated 12/28/2014: No significant change.   Holter monitor 48 hours 01/05/2018:  Normal  sinus rhythm.  Rare PVCs.  Rare PACs.  No patient triggered events.  Coronary Calcium Score 12/31/2017: 0. Visible lung fields are clear.   Treadmill exercise stress test 10/07/2016: EKG demonstrates NSR. The patient exercised according to Bruce Protocol, total time recorded 5:05 min achieving max heart rate of 158 bpm, which was 93% of THR for age and 7.05 METS of work. Stress terminated due to dyspnea, fatigue, lightheadedness, and THR met. Normal BP response. There was no ST-T changes of ischemia with exercise stress test. There were no significant arrhythmias. No evidence of ischemia by GXT. Exercise tolerance is markedly reduced for age. Consider evaluation for non cardiac etiology for dyspnea. Continue preventative therapy.   EKG:   EKG 05/04/2020: Sinus rhythm at a rate of 75 bpm. Normal axis. No evidence of ischemia. Compared to EKG 11/11/2017, no significant change.  Assessment     ICD-10-CM   1. Palpitations  R00.2     2. Hypertriglyceridemia  E78.1 Lipid Panel With LDL/HDL Ratio    Lipid Panel With LDL/HDL Ratio    3. Fibromyalgia  M79.7 Ambulatory referral to Rheumatology        There are no discontinued medications.   No orders of the defined types were placed in this encounter.   Recommendations:   Katherine Cruz is a 55 y.o. Caucasian female with history  of fibromyalgia, mild hyperlipidemia, mitral valve prolapse, tobacco use disorder (30-pack-year history quit in 2014) with coronary calcium score 0 in 2019. She has a history of chronic palpitations suggestive of PVC/PAC. Patient also with chronic dyspnea due to underlying emphysema.   Patient presents for 8-monthfollow-up of palpitations and fatigue.  At last visit felt symptoms related to underlying GI issues.  Patient has been struggling with significant mental health issues his last visit, however she [facility].  Patient appears  motivated to focus on reestablishing care with necessary health providers.  Patient has appointment scheduled with new PCP next month, and will request GI referral at that time.  In regard to fibromyalgia, patient is in significant pain and would like to reestablish with rheumatology at this time.  We will send referral to rheumatology for further management of fibromyalgia.  In regard to palpitations patient's symptoms remain consistent with PVCs/PACs.  Symptoms are chronic and relatively stable.  Discussed management options including beta-blocker therapy and a repeat cardiac monitor.  Patient reports she feels symptoms are related to high levels of stress presently.  Shared decision was to proceed with watchful waiting and close monitoring.  Patient agrees to notify our office if symptoms worsen or she develops new symptomatology.  We will repeat lipid profile testing in 3 months, particularly given patient's concern for hypertriglyceridemia.  If lipids remain well controlled at that time, would recommend annual testing.  Follow-up in 1 year, sooner if needed, for mitral regurgitation, palpitations, and hypertriglyceridemia.   CAlethia Berthold PA-C 10/27/2020, 9:57 AM Office: [phone]

## 2020-10-27 ENCOUNTER — Encounter: Payer: Self-pay | Admitting: Student

## 2020-10-27 ENCOUNTER — Other Ambulatory Visit: Payer: Self-pay

## 2020-10-27 ENCOUNTER — Ambulatory Visit: Payer: 59 | Admitting: Student

## 2020-10-27 VITALS — BP 111/62 | HR 69 | Temp 98.2°F | Resp 17 | Ht 64.0 in | Wt 111.6 lb

## 2020-10-27 DIAGNOSIS — E781 Pure hyperglyceridemia: Secondary | ICD-10-CM

## 2020-10-27 DIAGNOSIS — M797 Fibromyalgia: Secondary | ICD-10-CM

## 2020-10-27 DIAGNOSIS — R002 Palpitations: Secondary | ICD-10-CM

## 2020-10-27 NOTE — Patient Instructions (Signed)
Go to [facility] for lipid panel in months in a fasting state (around 01/27/2021).  Continue to monitor palpations and notify our office if symptoms worsen.  You have been referred to rheumatology, please notify our office if you have not heard form them in 2-3 weeks.  Be sure to establish with PCP, who can refer you to GI if necessary  Focus on staying well hydrated

## 2020-11-21 ENCOUNTER — Other Ambulatory Visit: Payer: Self-pay | Admitting: Obstetrics & Gynecology

## 2020-11-21 DIAGNOSIS — Z1231 Encounter for screening mammogram for malignant neoplasm of breast: Secondary | ICD-10-CM

## 2020-12-12 ENCOUNTER — Ambulatory Visit (INDEPENDENT_AMBULATORY_CARE_PROVIDER_SITE_OTHER): Payer: 59 | Admitting: Allergy and Immunology

## 2020-12-12 ENCOUNTER — Other Ambulatory Visit: Payer: Self-pay

## 2020-12-12 VITALS — BP 116/70 | HR 82 | Temp 98.2°F | Resp 16 | Ht 64.0 in | Wt 114.2 lb

## 2020-12-12 DIAGNOSIS — J3089 Other allergic rhinitis: Secondary | ICD-10-CM | POA: Diagnosis not present

## 2020-12-12 DIAGNOSIS — T781XXA Other adverse food reactions, not elsewhere classified, initial encounter: Secondary | ICD-10-CM

## 2020-12-12 DIAGNOSIS — R140 Abdominal distension (gaseous): Secondary | ICD-10-CM | POA: Diagnosis not present

## 2020-12-12 MED ORDER — TRIAMCINOLONE ACETONIDE 55 MCG/ACT NA AERO: 2.0000 | INHALATION_SPRAY | Freq: Every day | NASAL | 5 refills | Status: DC

## 2020-12-12 MED ORDER — OLOPATADINE HCL 0.2 % OP SOLN: 1.0000 [drp] | OPHTHALMIC | 5 refills | Status: DC

## 2020-12-12 MED ORDER — LORATADINE 10 MG PO TABS: 10.0000 mg | ORAL_TABLET | Freq: Two times a day (BID) | ORAL | 5 refills | Status: DC

## 2020-12-12 NOTE — Patient Instructions (Addendum)
  1.  Allergen avoidance measures - dust mite, cat (lactose?  2.  Treat and prevent inflammation:  A. OTC Nasacort - 1-2 sprays each nostril 3-7 times per week  3. If needed:  A. Loratadine 10 mg - 1-2 tablets 1 time per day B. Pataday - 1 drop each eye 1 time per day  4. Slowly consolidate caffeine consumption to help with anxiety  5. Obtain fall flu vaccine  6. Further evaluation???  7. Return to clinic in 4 weeks or earlier if problem
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
## 2020-12-13 ENCOUNTER — Encounter: Payer: Self-pay | Admitting: Allergy and Immunology

## 2020-12-13 ENCOUNTER — Telehealth: Payer: Self-pay | Admitting: *Deleted

## 2020-12-13 NOTE — Telephone Encounter (Signed)
Called patient and left a voicemail asking to return call to inform.

## 2020-12-13 NOTE — Telephone Encounter (Signed)
Patient called back and was relayed the message from Dr. Lucie Leather. She did state that she was vegan and already does not consume any products with lactose but that she will ensure to continue to do so.

## 2020-12-13 NOTE — Telephone Encounter (Signed)
-----   Message from Jessica Priest, MD sent at 12/13/2020  7:55 AM EDT ----- Please contact Katherine Cruz and ask her to undergo a lactose-free diet for a few weeks to see if this results in improvement regarding her abdominal bloating and we can talk about that further when she returns to this clinic.

## 2020-12-18 ENCOUNTER — Ambulatory Visit (INDEPENDENT_AMBULATORY_CARE_PROVIDER_SITE_OTHER): Payer: 59 | Admitting: Internal Medicine

## 2020-12-18 ENCOUNTER — Other Ambulatory Visit: Payer: Self-pay

## 2020-12-18 ENCOUNTER — Encounter: Payer: Self-pay | Admitting: Internal Medicine

## 2020-12-18 VITALS — BP 110/70 | HR 76 | Temp 98.7°F | Ht 64.0 in | Wt 113.0 lb

## 2020-12-18 DIAGNOSIS — I341 Nonrheumatic mitral (valve) prolapse: Secondary | ICD-10-CM | POA: Diagnosis not present

## 2020-12-18 DIAGNOSIS — Z23 Encounter for immunization: Secondary | ICD-10-CM

## 2020-12-18 DIAGNOSIS — I739 Peripheral vascular disease, unspecified: Secondary | ICD-10-CM | POA: Insufficient documentation

## 2020-12-18 DIAGNOSIS — F411 Generalized anxiety disorder: Secondary | ICD-10-CM

## 2020-12-18 DIAGNOSIS — E2839 Other primary ovarian failure: Secondary | ICD-10-CM | POA: Diagnosis not present

## 2020-12-18 DIAGNOSIS — M797 Fibromyalgia: Secondary | ICD-10-CM

## 2020-12-18 DIAGNOSIS — J438 Other emphysema: Secondary | ICD-10-CM

## 2020-12-18 DIAGNOSIS — Z0001 Encounter for general adult medical examination with abnormal findings: Secondary | ICD-10-CM

## 2020-12-18 DIAGNOSIS — Z1231 Encounter for screening mammogram for malignant neoplasm of breast: Secondary | ICD-10-CM

## 2020-12-18 DIAGNOSIS — Z1211 Encounter for screening for malignant neoplasm of colon: Secondary | ICD-10-CM

## 2020-12-18 DIAGNOSIS — E782 Mixed hyperlipidemia: Secondary | ICD-10-CM

## 2020-12-18 MED ORDER — SHINGRIX 50 MCG/0.5ML IM SUSR: 0.5000 mL | Freq: Once | INTRAMUSCULAR | 1 refills | Status: AC

## 2020-12-18 NOTE — Patient Instructions (Addendum)
[facility] [address] [place], [place] [phone]    Health Maintenance, Female Adopting a healthy lifestyle and getting preventive care are important in promoting health and wellness. Ask your health care provider about: The right schedule for you to have regular tests and exams. Things you can do on your own to prevent diseases and keep yourself healthy. What should I know about diet, weight, and exercise? Eat a healthy diet  Eat a diet that includes plenty of vegetables, fruits, low-fat dairy products, and lean protein. Do not eat a lot of foods that are high in solid fats, added sugars, or sodium.  Maintain a healthy weight Body mass index (BMI) is used to identify weight problems. It estimates body fat based on height and weight. Your health care provider can help determineyour BMI and help you achieve or maintain a healthy weight. Get regular exercise Get regular exercise. This is one of the most important things you can do for your health. Most adults should: Exercise for at least 150 minutes each week. The exercise should increase your heart rate and make you sweat (moderate-intensity exercise). Do strengthening exercises at least twice a week. This is in addition to the moderate-intensity exercise. Spend less time sitting. Even light physical activity can be beneficial. Watch cholesterol and blood lipids Have your blood tested for lipids and cholesterol at 56 years of age, then havethis test every 5 years. Have your cholesterol levels checked more often if: Your lipid or cholesterol levels are high. You are older than 56 years of age. You are at high risk for heart disease. What should I know about cancer screening? Depending on your health history and family history, you may need to have cancer screening at various ages. This may include screening for: Breast cancer. Cervical cancer. Colorectal cancer. Skin cancer. Lung cancer. What should  I know about heart disease, diabetes, and high blood pressure? Blood pressure and heart disease High blood pressure causes heart disease and increases the risk of stroke. This is more likely to develop in people who have high blood pressure readings, are of African descent, or are overweight. Have your blood pressure checked: Every 3-5 years if you are 56-56 years of age. Every year if you are 56 years old or older. Diabetes Have regular diabetes screenings. This checks your fasting blood sugar level. Have the screening done: Once every three years after age 56 if you are at a normal weight and have a low risk for diabetes. More often and at a younger age if you are overweight or have a high risk for diabetes. What should I know about preventing infection? Hepatitis B If you have a higher risk for hepatitis B, you should be screened for this virus. Talk with your health care provider to find out if you are at risk forhepatitis B infection. Hepatitis C Testing is recommended for: Everyone born from [birth year] through 1966. Anyone with known risk factors for hepatitis C. Sexually transmitted infections (STIs) Get screened for STIs, including gonorrhea and chlamydia, if: You are sexually active and are younger than 56 years of age. You are older than 56 years of age and your health care provider tells you that you are at risk for this type of infection. Your sexual activity has changed since you were last screened, and you are at increased risk for chlamydia or gonorrhea. Ask your health care provider if you are at risk. Ask your health care provider about whether you are at  high risk for HIV. Your health care provider may recommend a prescription medicine to help prevent HIV infection. If you choose to take medicine to prevent HIV, you should first get tested for HIV. You should then be tested every 3 months for as long as you are taking the medicine. Pregnancy If you are about to stop having your  period (premenopausal) and you may become pregnant, seek counseling before you get pregnant. Take 400 to 800 micrograms (mcg) of folic acid every day if you become pregnant. Ask for birth control (contraception) if you want to prevent pregnancy. Osteoporosis and menopause Osteoporosis is a disease in which the bones lose minerals and strength with aging. This can result in bone fractures. If you are 11 years old or older, or if you are at risk for osteoporosis and fractures, ask your health care provider if you should: Be screened for bone loss. Take a calcium or vitamin D supplement to lower your risk of fractures. Be given hormone replacement therapy (HRT) to treat symptoms of menopause. Follow these instructions at home: Lifestyle Do not use any products that contain nicotine or tobacco, such as cigarettes, e-cigarettes, and chewing tobacco. If you need help quitting, ask your health care provider. Do not use street drugs. Do not share needles. Ask your health care provider for help if you need support or information about quitting drugs. Alcohol use Do not drink alcohol if: Your health care provider tells you not to drink. You are pregnant, may be pregnant, or are planning to become pregnant. If you drink alcohol: Limit how much you use to 0-1 drink a day. Limit intake if you are breastfeeding. Be aware of how much alcohol is in your drink. In the [place] one drink equals one 12 oz bottle of beer (355 mL), one 5 oz glass of wine (148 mL), or one 1 oz glass of hard liquor (44 mL). General instructions Schedule regular health, dental, and eye exams. Stay current with your vaccines. Tell your health care provider if: You often feel depressed. You have ever been abused or do not feel safe at home. Summary Adopting a healthy lifestyle and getting preventive care are important in promoting health and wellness. Follow your health care provider's instructions about healthy diet, exercising,  and getting tested or screened for diseases. Follow your health care provider's instructions on monitoring your cholesterol and blood pressure. This information is not intended to replace advice given to you by your health care provider. Make sure you discuss any questions you have with your healthcare provider. Document Revised: 04/29/2018 Document Reviewed: 04/29/2018 Elsevier Patient Education  2022 [employer].

## 2020-12-18 NOTE — Progress Notes (Signed)
mvp  Subjective:  Patient ID: Katherine Cruz, female    DOB: 1966-02-01  Age: 55 y.o. MRN: [id number]  CC: Annual Exam  This visit occurred during the SARS-CoV-2 public health emergency.  Safety protocols were in place, including screening questions prior to the visit, additional usage of staff PPE, and extensive cleaning of exam room while observing appropriate contact time as indicated for disinfecting solutions.    HPI Katherine Cruz presents for a CPX and to establish.  She complains of chronic shortness of breath.  She was told a year ago by a pulmonologist that this was caused by emphysema.  She quit smoking and her symptoms have improved.  She complains of lower extremity pain that occurs both with rest and with activity.  She tells me she has a history of anxiety, depression, ADHD, OCD, and PTSD and she wants to be referred to a therapist.  She also tells me she has a history of essential tremor and fibromyalgia.  Outpatient Medications Prior to Visit  Medication Sig Dispense Refill   loratadine (CLARITIN) 10 MG tablet Take 1 tablet (10 mg total) by mouth 2 (two) times daily. 60 tablet 5   Olopatadine HCl (PATADAY) 0.2 % SOLN Place 1 drop into both eyes 1 day or 1 dose. 2.5 mL 5   triamcinolone (NASACORT) 55 MCG/ACT AERO nasal inhaler Place 2 sprays into the nose daily. 17 g 5   No facility-administered medications prior to visit.    ROS Review of Systems  HENT: Negative.    Eyes: Negative.   Respiratory:  Positive for shortness of breath. Negative for cough, chest tightness and wheezing.   Cardiovascular:  Negative for chest pain, palpitations and leg swelling.  Gastrointestinal:  Negative for abdominal pain, constipation, diarrhea, nausea and vomiting.  Endocrine: Negative.   Genitourinary: Negative.  Negative for difficulty urinating, frequency and urgency.  Musculoskeletal:  Positive for arthralgias and myalgias.  Skin: Negative.   Neurological: Negative.  Negative for  dizziness, weakness, light-headedness and headaches.  Hematological:  Negative for adenopathy. Does not bruise/bleed easily.  Psychiatric/Behavioral:  Positive for dysphoric mood. Negative for agitation, behavioral problems, confusion, decreased concentration, hallucinations, self-injury, sleep disturbance and suicidal ideas. The patient is nervous/anxious. The patient is not hyperactive.    Objective:  BP 110/70 (BP Location: Right Arm, Patient Position: Sitting, Cuff Size: Large)   Pulse 76   Temp 98.7 F (37.1 C) (Oral)   Ht 5\' 4"  (1.626 m)   Wt 113 lb (51.3 kg)   LMP 03/24/2015 (Approximate)   SpO2 95%   BMI 19.40 kg/m   BP Readings from Last 3 Encounters:  12/18/20 110/70  12/12/20 116/70  10/27/20 111/62    Wt Readings from Last 3 Encounters:  12/18/20 113 lb (51.3 kg)  12/12/20 114 lb 3.2 oz (51.8 kg)  10/27/20 111 lb 9.6 oz (50.6 kg)    Physical Exam Vitals reviewed.  Constitutional:      Appearance: Normal appearance.  HENT:     Nose: Nose normal.     Mouth/Throat:     Mouth: Mucous membranes are moist.  Eyes:     General: No scleral icterus.    Conjunctiva/sclera: Conjunctivae normal.  Cardiovascular:     Rate and Rhythm: Normal rate and regular rhythm.     Pulses:          Dorsalis pedis pulses are 1+ on the right side and 1+ on the left side.       Posterior tibial pulses are 0 on  the right side and 0 on the left side.     Heart sounds: No murmur heard.   No gallop.  Pulmonary:     Effort: Pulmonary effort is normal.     Breath sounds: No stridor. No wheezing, rhonchi or rales.  Abdominal:     General: Abdomen is flat.     Palpations: There is no mass.     Tenderness: There is no abdominal tenderness. There is no guarding.     Hernia: No hernia is present.  Musculoskeletal:        General: Normal range of motion.     Cervical back: Neck supple.     Right lower leg: No edema.     Left lower leg: No edema.  Feet:     Right foot:     Skin  integrity: Skin integrity normal.     Toenail Condition: Right toenails are normal.     Left foot:     Skin integrity: Skin integrity normal.     Toenail Condition: Fungal disease present. Lymphadenopathy:     Cervical: No cervical adenopathy.  Skin:    General: Skin is warm and dry.     Findings: No rash.  Neurological:     General: No focal deficit present.     Mental Status: She is alert and oriented to person, place, and time.  Psychiatric:        Attention and Perception: Perception normal. She is inattentive.        Mood and Affect: Mood is anxious. Mood is not depressed.        Speech: She is communicative. Speech is tangential. Speech is not rapid and pressured, delayed or slurred.        Behavior: Behavior normal.        Thought Content: Thought content normal. Thought content is not paranoid or delusional. Thought content does not include homicidal or suicidal ideation.        Cognition and Memory: Cognition normal.    Lab Results  Component Value Date   WBC 3.9 (L) 04/20/2020   HGB 13.9 04/20/2020   HCT 41.4 04/20/2020   PLT 193.0 04/20/2020   GLUCOSE 91 04/20/2020   CHOL 193 07/18/2020   TRIG 48 07/18/2020   HDL 71 07/18/2020   LDLCALC 113 (H) 07/18/2020   ALT 20 04/20/2020   AST 21 04/20/2020   NA 143 04/20/2020   K 3.6 04/20/2020   CL 102 04/20/2020   CREATININE 0.62 04/20/2020   BUN 16 04/20/2020   CO2 33 (H) 04/20/2020   TSH 3.23 04/20/2020    US BREAST LTD UNI LEFT INC AXILLA  Result Date: 12/21/2019 CLINICAL DATA:  Focal pain in the left breast. EXAM: DIGITAL DIAGNOSTIC BILATERAL MAMMOGRAM WITH CAD AND TOMO ULTRASOUND LEFT BREAST COMPARISON:  Previous exam(s). ACR Breast Density Category c: The breast tissue is heterogeneously dense, which may obscure small masses. FINDINGS: No suspicious masses, calcifications, or distortion are identified in either breast. Mammographic images were processed with CAD. On physical exam, no suspicious lumps are  identified. Targeted ultrasound is performed, showing no sonographic abnormalities in the region of the patient's focal pain. IMPRESSION: No mammographic or sonographic evidence of malignancy. RECOMMENDATION: Annual screening mammography. Treatment of the patient's symptoms should be based on clinical and physical exam given lack of imaging findings. I have discussed the findings and recommendations with the patient. If applicable, a reminder letter will be sent to the patient regarding the next appointment. BI-RADS CATEGORY  2:  Benign. Electronically Signed   By: Gerome Sam III M.D   On: 12/21/2019 15:28   MM DIAG BREAST TOMO BILATERAL  Result Date: 12/21/2019 CLINICAL DATA:  Focal pain in the left breast. EXAM: DIGITAL DIAGNOSTIC BILATERAL MAMMOGRAM WITH CAD AND TOMO ULTRASOUND LEFT BREAST COMPARISON:  Previous exam(s). ACR Breast Density Category c: The breast tissue is heterogeneously dense, which may obscure small masses. FINDINGS: No suspicious masses, calcifications, or distortion are identified in either breast. Mammographic images were processed with CAD. On physical exam, no suspicious lumps are identified. Targeted ultrasound is performed, showing no sonographic abnormalities in the region of the patient's focal pain. IMPRESSION: No mammographic or sonographic evidence of malignancy. RECOMMENDATION: Annual screening mammography. Treatment of the patient's symptoms should be based on clinical and physical exam given lack of imaging findings. I have discussed the findings and recommendations with the patient. If applicable, a reminder letter will be sent to the patient regarding the next appointment. BI-RADS CATEGORY  2: Benign. Electronically Signed   By: Gerome Sam III M.D   On: 12/21/2019 15:28    Assessment & Plan:   Katherine Cruz was seen today for annual exam.  Diagnoses and all orders for this visit:  Estrogen deficiency -     DG Bone Density; Future  Colon cancer screening -      Ambulatory referral to Gastroenterology  Visit for screening mammogram -     MM DIGITAL SCREENING BILATERAL; Future  MVP (mitral valve prolapse)  Other emphysema (HCC)-her symptoms have improved.  GAD (generalized anxiety disorder) -     Ambulatory referral to Psychology  Claudication of lower extremity (HCC)-we will screen for PAD. -     VAS [place] ABI WITH/WO TBI; Future  Fibromyalgia  Need for shingles vaccine -     Zoster Vaccine Adjuvanted [facility]) injection; Inject 0.5 mLs into the muscle once for 1 dose.  Mixed hyperlipidemia- She has a low ASCVD risk core.  Statin therapy is not indicated.  Encounter for general adult medical examination with abnormal findings- Exam completed, labs reviewed, vaccines reviewed and updated, cancer screenings addressed, patient education material was given.  I have discontinued Katherine Cruz's Olopatadine HCl, loratadine, and triamcinolone. I am also having her start on Shingrix.  Meds ordered this encounter  Medications   Zoster Vaccine Adjuvanted [facility]) injection    Sig: Inject 0.5 mLs into the muscle once for 1 dose.    Dispense:  0.5 mL    Refill:  1      Follow-up: Return in about 3 months (around 03/20/2021).  Sanda Linger, MD

## 2020-12-21 ENCOUNTER — Telehealth: Payer: Self-pay

## 2020-12-21 DIAGNOSIS — J3089 Other allergic rhinitis: Secondary | ICD-10-CM

## 2020-12-21 NOTE — Telephone Encounter (Signed)
Please have Gola obtain a area two aero allergen profile and further investigation of possible allergic disease.

## 2020-12-21 NOTE — Telephone Encounter (Signed)
I printed off the lab requisition for an Allergens with total IgE Area 2 blood draw. Please advise for further investigation of possible allergic disease lab test. I called the patient to have her come in for lab work I left a message for her to call the office back.

## 2020-12-21 NOTE — Telephone Encounter (Signed)
Patient called stating she has some concerns with her allergy test results. Patient states she was told she is only allergic to Cat & Dust Mite. She states every time she works outside in the garden she has to use almost a whole roll of tissue paper. Patient is wondering is there anything else that she can test for or do to figure out what maker her flare up when she is outdoors?   Please Advise

## 2020-12-22 NOTE — Telephone Encounter (Signed)
Patient called back and would like to wait on getting blood work. She would like to go to another Allergist for a second opinion. She will call back when she is ready to come to the [place] office for blood work.

## 2020-12-29 ENCOUNTER — Encounter (HOSPITAL_COMMUNITY): Payer: 59

## 2020-12-29 ENCOUNTER — Inpatient Hospital Stay: Admission: RE | Admit: 2020-12-29 | Payer: 59 | Source: Ambulatory Visit

## 2021-01-12 ENCOUNTER — Ambulatory Visit
Admission: RE | Admit: 2021-01-12 | Discharge: 2021-01-12 | Disposition: A | Discharge status: Home / Self Care | Payer: 59 | Source: Ambulatory Visit | Attending: Obstetrics & Gynecology | Admitting: Obstetrics & Gynecology

## 2021-01-12 ENCOUNTER — Other Ambulatory Visit: Payer: Self-pay

## 2021-01-12 DIAGNOSIS — Z1231 Encounter for screening mammogram for malignant neoplasm of breast: Secondary | ICD-10-CM

## 2021-01-15 ENCOUNTER — Telehealth: Payer: Self-pay | Admitting: Internal Medicine

## 2021-01-15 NOTE — Telephone Encounter (Signed)
Pt has been informed to drop for off.

## 2021-01-15 NOTE — Telephone Encounter (Signed)
Patient is recently new patient of provider  Patient says she has handicap form needing to be filled out by provider  Wants to know if provider is able to fill out or does she need to schedule appt to be seen before provider can fill out  Please follow up [phone]

## 2021-01-16 ENCOUNTER — Telehealth: Payer: Self-pay

## 2021-01-16 NOTE — Telephone Encounter (Signed)
Please take a look at the form in the yellow folder that I have placed on your desk in regard. Is that request able to be honored?

## 2021-01-16 NOTE — Telephone Encounter (Signed)
Type of form received:Disability Forms For Tax Exclusion  Received GN:[id number] Katherine Cruz  Form should be Faxed to: no  Form should be mailed to:  no  Is patient requesting call for pickup: [phone]   Form placed in the Provider's box.  Charge sheet will be attached  Was patient informed of  7-10 business day turn around (Y/N)?  YES

## 2021-01-26 ENCOUNTER — Other Ambulatory Visit: Payer: Self-pay

## 2021-01-26 ENCOUNTER — Ambulatory Visit (INDEPENDENT_AMBULATORY_CARE_PROVIDER_SITE_OTHER)
Admission: RE | Admit: 2021-01-26 | Discharge: 2021-01-26 | Disposition: A | Discharge status: Home / Self Care | Payer: 59 | Source: Ambulatory Visit | Attending: Internal Medicine | Admitting: Internal Medicine

## 2021-01-26 DIAGNOSIS — E2839 Other primary ovarian failure: Secondary | ICD-10-CM | POA: Diagnosis not present

## 2021-01-30 ENCOUNTER — Encounter (HOSPITAL_COMMUNITY): Payer: 59

## 2021-02-06 ENCOUNTER — Ambulatory Visit: Payer: 59 | Admitting: Internal Medicine

## 2021-02-06 ENCOUNTER — Ambulatory Visit: Payer: 59

## 2021-02-09 ENCOUNTER — Encounter (HOSPITAL_COMMUNITY): Payer: 59

## 2021-02-26 ENCOUNTER — Encounter (HOSPITAL_COMMUNITY): Payer: 59

## 2021-03-07 ENCOUNTER — Encounter (HOSPITAL_COMMUNITY): Payer: Self-pay

## 2021-03-19 ENCOUNTER — Ambulatory Visit (INDEPENDENT_AMBULATORY_CARE_PROVIDER_SITE_OTHER): Payer: 59 | Admitting: Internal Medicine

## 2021-03-19 ENCOUNTER — Ambulatory Visit (INDEPENDENT_AMBULATORY_CARE_PROVIDER_SITE_OTHER): Payer: 59

## 2021-03-19 ENCOUNTER — Encounter: Payer: Self-pay | Admitting: Internal Medicine

## 2021-03-19 ENCOUNTER — Other Ambulatory Visit: Payer: Self-pay

## 2021-03-19 VITALS — BP 118/74 | HR 80 | Temp 98.1°F | Ht 64.0 in | Wt 117.0 lb

## 2021-03-19 VITALS — BP 118/74 | HR 80 | Temp 98.1°F | Resp 16 | Ht 64.0 in | Wt 117.0 lb

## 2021-03-19 DIAGNOSIS — Z0000 Encounter for general adult medical examination without abnormal findings: Secondary | ICD-10-CM | POA: Diagnosis not present

## 2021-03-19 DIAGNOSIS — M797 Fibromyalgia: Secondary | ICD-10-CM | POA: Diagnosis not present

## 2021-03-19 DIAGNOSIS — R10811 Right upper quadrant abdominal tenderness: Secondary | ICD-10-CM

## 2021-03-19 DIAGNOSIS — Z23 Encounter for immunization: Secondary | ICD-10-CM | POA: Diagnosis not present

## 2021-03-19 LAB — CBC WITH DIFFERENTIAL/PLATELET
Basophils Absolute: 0 10*3/uL (ref 0.0–0.1)
Basophils Relative: 0.6 % (ref 0.0–3.0)
Eosinophils Absolute: 0.1 10*3/uL (ref 0.0–0.7)
Eosinophils Relative: 2.6 % (ref 0.0–5.0)
HCT: 42.5 % (ref 36.0–46.0)
Hemoglobin: 14.3 g/dL (ref 12.0–15.0)
Lymphocytes Relative: 29.4 % (ref 12.0–46.0)
Lymphs Abs: 1.5 10*3/uL (ref 0.7–4.0)
MCHC: 33.6 g/dL (ref 30.0–36.0)
MCV: 92.2 fl (ref 78.0–100.0)
Monocytes Absolute: 0.2 10*3/uL (ref 0.1–1.0)
Monocytes Relative: 4.4 % (ref 3.0–12.0)
Neutro Abs: 3.3 10*3/uL (ref 1.4–7.7)
Neutrophils Relative %: 63 % (ref 43.0–77.0)
Platelets: 200 10*3/uL (ref 150.0–400.0)
RBC: 4.61 Mil/uL (ref 3.87–5.11)
RDW: 14.1 % (ref 11.5–15.5)
WBC: 5.2 10*3/uL (ref 4.0–10.5)

## 2021-03-19 LAB — URINALYSIS, ROUTINE W REFLEX MICROSCOPIC
Bilirubin Urine: NEGATIVE
Ketones, ur: 15 — AB
Leukocytes,Ua: NEGATIVE
Nitrite: NEGATIVE
Specific Gravity, Urine: 1.025 (ref 1.000–1.030)
Total Protein, Urine: NEGATIVE
Urine Glucose: NEGATIVE
Urobilinogen, UA: 0.2 (ref 0.0–1.0)
WBC, UA: NONE SEEN (ref 0–?)
pH: 6 (ref 5.0–8.0)

## 2021-03-19 LAB — HEPATIC FUNCTION PANEL
ALT: 25 U/L (ref 0–35)
AST: 25 U/L (ref 0–37)
Albumin: 4.7 g/dL (ref 3.5–5.2)
Alkaline Phosphatase: 67 U/L (ref 39–117)
Bilirubin, Direct: 0.1 mg/dL (ref 0.0–0.3)
Total Bilirubin: 0.9 mg/dL (ref 0.2–1.2)
Total Protein: 7.3 g/dL (ref 6.0–8.3)

## 2021-03-19 LAB — BASIC METABOLIC PANEL
BUN: 13 mg/dL (ref 6–23)
CO2: 29 mEq/L (ref 19–32)
Calcium: 9.6 mg/dL (ref 8.4–10.5)
Chloride: 100 mEq/L (ref 96–112)
Creatinine, Ser: 0.65 mg/dL (ref 0.40–1.20)
GFR: 98.41 mL/min (ref >60.00)
Glucose, Bld: 82 mg/dL (ref 70–99)
Potassium: 3.6 mEq/L (ref 3.5–5.1)
Sodium: 140 mEq/L (ref 135–145)

## 2021-03-19 LAB — TSH: TSH: 4.15 u[IU]/mL (ref 0.35–5.50)

## 2021-03-19 LAB — AMMONIA: Ammonia: 29 umol/L (ref 11–35)

## 2021-03-19 MED ORDER — SHINGRIX 50 MCG/0.5ML IM SUSR: 0.5000 mL | Freq: Once | INTRAMUSCULAR | 1 refills | Status: AC

## 2021-03-19 NOTE — Progress Notes (Signed)
Subjective:  Patient ID: Katherine Cruz, female    DOB: 1965/07/28  Age: 55 y.o. MRN: [id number]  CC: Abdominal Pain  This visit occurred during the SARS-CoV-2 public health emergency.  Safety protocols were in place, including screening questions prior to the visit, additional usage of staff PPE, and extensive cleaning of exam room while observing appropriate contact time as indicated for disinfecting solutions.    HPI Katherine Cruz presents for f/up -  She continues to have a positive review of systems.  She has fibromyalgia pain but is not willing to take a medication.  She has abdominal bloating and right upper quadrant pain that is chronic in nature.  She has had a few episodes of nausea but no vomiting.  She complains of chronic fatigue.  She has a good appetite and is not losing weight.  She complains of tremors, jerking, poor balance, and anxiety.  She also has a sensation that she taste ammonia - this has been a concern for the last year.  No outpatient medications prior to visit.   No facility-administered medications prior to visit.    ROS Review of Systems  Constitutional:  Positive for fatigue. Negative for appetite change, chills and diaphoresis.  HENT: Negative.    Eyes: Negative.   Respiratory: Negative.  Negative for cough, choking and shortness of breath.   Cardiovascular:  Negative for chest pain, palpitations and leg swelling.  Gastrointestinal:  Positive for abdominal pain and nausea. Negative for constipation, diarrhea and vomiting.  Genitourinary: Negative.  Negative for difficulty urinating.  Musculoskeletal:  Positive for myalgias. Negative for arthralgias and back pain.  Skin: Negative.   Neurological: Negative.  Negative for dizziness and weakness.  Hematological:  Negative for adenopathy. Does not bruise/bleed easily.  Psychiatric/Behavioral:  Positive for dysphoric mood. Negative for sleep disturbance and suicidal ideas. The patient is nervous/anxious.     Objective:  BP 118/74 (BP Location: Right Arm, Patient Position: Sitting, Cuff Size: Large)   Pulse 80   Temp 98.1 F (36.7 C) (Oral)   Resp 16   Ht 5\' 4"  (1.626 m)   Wt 117 lb (53.1 kg)   LMP 03/24/2015 (Approximate)   SpO2 99%   BMI 20.08 kg/m   BP Readings from Last 3 Encounters:  03/19/21 118/74  03/19/21 118/74  12/18/20 110/70    Wt Readings from Last 3 Encounters:  03/19/21 117 lb (53.1 kg)  03/19/21 117 lb (53.1 kg)  12/18/20 113 lb (51.3 kg)    Physical Exam Vitals reviewed.  Constitutional:      Appearance: Normal appearance.  HENT:     Nose: Nose normal.     Mouth/Throat:     Mouth: Mucous membranes are moist.  Eyes:     General: No scleral icterus.    Conjunctiva/sclera: Conjunctivae normal.  Cardiovascular:     Rate and Rhythm: Normal rate and regular rhythm.     Heart sounds: No murmur heard. Pulmonary:     Effort: Pulmonary effort is normal.     Breath sounds: No stridor. No wheezing, rhonchi or rales.  Abdominal:     General: Abdomen is flat. Bowel sounds are normal. There is no distension.     Palpations: Abdomen is soft. There is no fluid wave, hepatomegaly, splenomegaly or mass.     Tenderness: There is abdominal tenderness in the right upper quadrant. There is no guarding or rebound.  Musculoskeletal:        General: Normal range of motion.     Cervical  back: Neck supple.     Right lower leg: No edema.     Left lower leg: No edema.  Lymphadenopathy:     Cervical: No cervical adenopathy.  Skin:    General: Skin is warm and dry.     Coloration: Skin is not pale.  Neurological:     General: No focal deficit present.     Mental Status: She is alert.  Psychiatric:        Mood and Affect: Mood normal.        Behavior: Behavior normal.    Lab Results  Component Value Date   WBC 5.2 03/19/2021   HGB 14.3 03/19/2021   HCT 42.5 03/19/2021   PLT 200.0 03/19/2021   GLUCOSE 82 03/19/2021   CHOL 193 07/18/2020   TRIG 48 07/18/2020    HDL 71 07/18/2020   LDLCALC 113 (H) 07/18/2020   ALT 25 03/19/2021   AST 25 03/19/2021   NA 140 03/19/2021   K 3.6 03/19/2021   CL 100 03/19/2021   CREATININE 0.65 03/19/2021   BUN 13 03/19/2021   CO2 29 03/19/2021   TSH 4.15 03/19/2021    DG Bone Density  Result Date: 01/26/2021 Table formatting from the original result was not included. Date of study: 01/26/2021 Exam: DUAL X-RAY ABSORPTIOMETRY (DXA) FOR BONE MINERAL DENSITY (BMD) Instrument: [employer] Requesting Provider: PCP Indication: follow up for low BMD Comparison: none (please note that it is not possible to compare data from different instruments) Clinical data: Pt is a 55 y.o. female without previous history of fracture. Results:  Lumbar spine L1-L4 Femoral neck (FN) T-score -0.8 RFN: - 0.7 LFN: -1.1 Assessment: By the [facility] Criteria for diagnosis based on bone density, this patient has Low Bone Density Z Score compares the patients bone density to age, sex, and race matched controls.  Compared to age, sex, and race matched controls. FRAX 10-year fracture risk calculator: 5.4 % for any major fracture and 0.3 % for hip fracture.  Pharmacologic therapy is recommended if 10 year fracture risk is >20% for any major osteoporotic fracture or >3% for hip fracture.  Comments: the technical quality of the study is good. WHO criteria for diagnosis of osteoporosis in postmenopausal women and in men 27 y/o or older: - normal: T-score -1.0 to + 1.0 - osteopenia/low bone density: T-score between -2.5 and -1.0 - osteoporosis: T-score below -2.5 - severe osteoporosis: T-score below -2.5 with history of fragility fracture Note: although not part of the WHO classification, the presence of a fragility fracture, regardless of the T-score, should be considered diagnostic of osteoporosis, provided other causes for the fracture have been excluded. RECOMMENDATION: 1. All patients should optimize calcium and vitamin D intake. 2. Consider FDA-approved medical  therapies in postmenopausal women and men aged 50 years and older, based on the following: a. A hip or vertebral(clinical or morphometric) fracture. b. T-Score of  -2.5 or less at the femoral neck , total hip or spine after appropriate evaluation to exclude secondary causes c. Low bone mass (T-score between -1.0 and -2.5 at the femoral neck or spine) and a 10 year probability of a hip fracture >3% or a 10 year probability of major osteoporosis-related fracture > 20% based on the [place]-adapted WHO algorithm d. Clinical judgement and/or patient preferences may indicate treatment for people with 10-year fracture probabilities above or below these levels Follow up BMD is recommended: 2 years. Interpreted by : Lyndle Herrlich, MD [facility]    Assessment &  Plan:   Katherine Cruz was seen today for abdominal pain.  Diagnoses and all orders for this visit:  Right upper quadrant abdominal tenderness without rebound tenderness- Her labs are all normal.  Will screen for gallstones. -     CBC with Differential/Platelet; Future -     Basic metabolic panel; Future -     TSH; Future -     Hepatic function panel; Future -     Urinalysis, Routine w reflex microscopic; Future -     Ammonia; Future -     US Abdomen Limited RUQ (LIVER/GB); Future -     Ammonia -     Urinalysis, Routine w reflex microscopic -     Hepatic function panel -     TSH -     Basic metabolic panel -     CBC with Differential/Platelet  Need for shingles vaccine -     Zoster Vaccine Adjuvanted [facility]) injection; Inject 0.5 mLs into the muscle once for 1 dose.  Fibromyalgia- She tells me that she will go back to see her rheumatologist in [facility]. -     Ammonia; Future -     Ammonia  Other orders -     Flu Vaccine QUAD 6+ mos PF IM (Fluarix Quad PF)  I am having Katherine Cruz start on Shingrix.  Meds ordered this encounter  Medications   Zoster Vaccine Adjuvanted [facility]) injection    Sig: Inject 0.5 mLs into  the muscle once for 1 dose.    Dispense:  0.5 mL    Refill:  1      Follow-up: Return in about 3 months (around 06/19/2021).  Sanda Linger, MD

## 2021-03-19 NOTE — Patient Instructions (Signed)
Katherine Cruz , Thank you for taking time to come for your Medicare Wellness Visit. I appreciate your ongoing commitment to your health goals. Please review the following plan we discussed and let me know if I can assist you in the future.   Screening recommendations/referrals: Colonoscopy: never done Mammogram: 01/12/2021; due every 1-2 years Bone Density: 01/26/2021; due every 2 years (osteopenia) Recommended yearly ophthalmology/optometry visit for glaucoma screening and checkup Recommended yearly dental visit for hygiene and checkup  Vaccinations: Influenza vaccine: 03/19/2021 Pneumococcal vaccine: never done Tdap vaccine: 02/03/2020; due every 10 years Shingles vaccine: never done; can check with local pharmacy   Covid-19: 08/26/2019, 09/21/2019, 05/11/2020  Advanced directives: Advance directive discussed with you today. Even though you declined this today please call our office should you change your mind and we can give you the proper paperwork for you to fill out.  Conditions/risks identified: Yes; Client understands the importance of follow-up with providers by attending scheduled visits and discussed goals to eat healthier, increase physical activity, exercise the brain, socialize more, get enough sleep and make time for laughter.  Next appointment: Please schedule your next Medicare Wellness Visit with your Nurse Health Advisor in 1 year by calling [phone].  Preventive Care 40-64 Years, Female Preventive care refers to lifestyle choices and visits with your health care provider that can promote health and wellness. What does preventive care include? A yearly physical exam. This is also called an annual well check. Dental exams once or twice a year. Routine eye exams. Ask your health care provider how often you should have your eyes checked. Personal lifestyle choices, including: Daily care of your teeth and gums. Regular physical activity. Eating a healthy diet. Avoiding tobacco  and drug use. Limiting alcohol use. Practicing safe sex. Taking low-dose aspirin daily starting at age 33. Taking vitamin and mineral supplements as recommended by your health care provider. What happens during an annual well check? The services and screenings done by your health care provider during your annual well check will depend on your age, overall health, lifestyle risk factors, and family history of disease. Counseling  Your health care provider may ask you questions about your: Alcohol use. Tobacco use. Drug use. Emotional well-being. Home and relationship well-being. Sexual activity. Eating habits. Work and work Statistician. Method of birth control. Menstrual cycle. Pregnancy history. Screening  You may have the following tests or measurements: Height, weight, and BMI. Blood pressure. Lipid and cholesterol levels. These may be checked every 5 years, or more frequently if you are over 79 years old. Skin check. Lung cancer screening. You may have this screening every year starting at age 49 if you have a 30-pack-year history of smoking and currently smoke or have quit within the past 15 years. Fecal occult blood test (FOBT) of the stool. You may have this test every year starting at age 36. Flexible sigmoidoscopy or colonoscopy. You may have a sigmoidoscopy every 5 years or a colonoscopy every 10 years starting at age 41. Hepatitis C blood test. Hepatitis B blood test. Sexually transmitted disease (STD) testing. Diabetes screening. This is done by checking your blood sugar (glucose) after you have not eaten for a while (fasting). You may have this done every 1-3 years. Mammogram. This may be done every 1-2 years. Talk to your health care provider about when you should start having regular mammograms. This may depend on whether you have a family history of breast cancer. BRCA-related cancer screening. This may be done if you have a  family history of breast, ovarian, tubal,  or peritoneal cancers. Pelvic exam and Pap test. This may be done every 3 years starting at age 71. Starting at age 42, this may be done every 5 years if you have a Pap test in combination with an HPV test. Bone density scan. This is done to screen for osteoporosis. You may have this scan if you are at high risk for osteoporosis. Discuss your test results, treatment options, and if necessary, the need for more tests with your health care provider. Vaccines  Your health care provider may recommend certain vaccines, such as: Influenza vaccine. This is recommended every year. Tetanus, diphtheria, and acellular pertussis (Tdap, Td) vaccine. You may need a Td booster every 10 years. Zoster vaccine. You may need this after age 57. Pneumococcal 13-valent conjugate (PCV13) vaccine. You may need this if you have certain conditions and were not previously vaccinated. Pneumococcal polysaccharide (PPSV23) vaccine. You may need one or two doses if you smoke cigarettes or if you have certain conditions. Talk to your health care provider about which screenings and vaccines you need and how often you need them. This information is not intended to replace advice given to you by your health care provider. Make sure you discuss any questions you have with your health care provider. Document Released: 06/02/2015 Document Revised: 01/24/2016 Document Reviewed: 03/07/2015 Elsevier Interactive Patient Education  2017 [place] Prevention in the Home Falls can cause injuries. They can happen to people of all ages. There are many things you can do to make your home safe and to help prevent falls. What can I do on the outside of my home? Regularly fix the edges of walkways and driveways and fix any cracks. Remove anything that might make you trip as you walk through a door, such as a raised step or threshold. Trim any bushes or trees on the path to your home. Use bright outdoor lighting. Clear any  walking paths of anything that might make someone trip, such as rocks or tools. Regularly check to see if handrails are loose or broken. Make sure that both sides of any steps have handrails. Any raised decks and porches should have guardrails on the edges. Have any leaves, snow, or ice cleared regularly. Use sand or salt on walking paths during winter. Clean up any spills in your garage right away. This includes oil or grease spills. What can I do in the bathroom? Use night lights. Install grab bars by the toilet and in the tub and shower. Do not use towel bars as grab bars. Use non-skid mats or decals in the tub or shower. If you need to sit down in the shower, use a plastic, non-slip stool. Keep the floor dry. Clean up any water that spills on the floor as soon as it happens. Remove soap buildup in the tub or shower regularly. Attach bath mats securely with double-sided non-slip rug tape. Do not have throw rugs and other things on the floor that can make you trip. What can I do in the bedroom? Use night lights. Make sure that you have a light by your bed that is easy to reach. Do not use any sheets or blankets that are too big for your bed. They should not hang down onto the floor. Have a firm chair that has side arms. You can use this for support while you get dressed. Do not have throw rugs and other things on the floor that can make  you trip. What can I do in the kitchen? Clean up any spills right away. Avoid walking on wet floors. Keep items that you use a lot in easy-to-reach places. If you need to reach something above you, use a strong step stool that has a grab bar. Keep electrical cords out of the way. Do not use floor polish or wax that makes floors slippery. If you must use wax, use non-skid floor wax. Do not have throw rugs and other things on the floor that can make you trip. What can I do with my stairs? Do not leave any items on the stairs. Make sure that there are  handrails on both sides of the stairs and use them. Fix handrails that are broken or loose. Make sure that handrails are as long as the stairways. Check any carpeting to make sure that it is firmly attached to the stairs. Fix any carpet that is loose or worn. Avoid having throw rugs at the top or bottom of the stairs. If you do have throw rugs, attach them to the floor with carpet tape. Make sure that you have a light switch at the top of the stairs and the bottom of the stairs. If you do not have them, ask someone to add them for you. What else can I do to help prevent falls? Wear shoes that: Do not have high heels. Have rubber bottoms. Are comfortable and fit you well. Are closed at the toe. Do not wear sandals. If you use a stepladder: Make sure that it is fully opened. Do not climb a closed stepladder. Make sure that both sides of the stepladder are locked into place. Ask someone to hold it for you, if possible. Clearly mark and make sure that you can see: Any grab bars or handrails. First and last steps. Where the edge of each step is. Use tools that help you move around (mobility aids) if they are needed. These include: Canes. Walkers. Scooters. Crutches. Turn on the lights when you go into a dark area. Replace any light bulbs as soon as they burn out. Set up your furniture so you have a clear path. Avoid moving your furniture around. If any of your floors are uneven, fix them. If there are any pets around you, be aware of where they are. Review your medicines with your doctor. Some medicines can make you feel dizzy. This can increase your chance of falling. Ask your doctor what other things that you can do to help prevent falls. This information is not intended to replace advice given to you by your health care provider. Make sure you discuss any questions you have with your health care provider. Document Released: 03/02/2009 Document Revised: 10/12/2015 Document Reviewed:  06/10/2014 Elsevier Interactive Patient Education  2017 [employer].

## 2021-03-19 NOTE — Patient Instructions (Signed)
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
## 2021-03-20 ENCOUNTER — Ambulatory Visit (HOSPITAL_COMMUNITY)
Admission: RE | Admit: 2021-03-20 | Discharge: 2021-03-20 | Disposition: A | Discharge status: Home / Self Care | Payer: 59 | Source: Ambulatory Visit | Attending: Internal Medicine | Admitting: Internal Medicine

## 2021-03-20 DIAGNOSIS — I739 Peripheral vascular disease, unspecified: Secondary | ICD-10-CM | POA: Diagnosis not present

## 2021-03-23 ENCOUNTER — Other Ambulatory Visit: Payer: Self-pay

## 2021-03-23 ENCOUNTER — Telehealth: Payer: Self-pay | Admitting: Internal Medicine

## 2021-03-23 ENCOUNTER — Telehealth: Payer: Self-pay

## 2021-03-23 DIAGNOSIS — M79604 Pain in right leg: Secondary | ICD-10-CM

## 2021-03-23 NOTE — Telephone Encounter (Signed)
Patient calls today to request an appointment for varicose veins because "I have a family history and I see one on my left thigh and I suspect I have them." She c/o dull achy, heavy sensation in her legs - both are painful, but left is worse than right. Placed on schedule in a month for le reflux on the left and a visit.

## 2021-03-23 NOTE — Telephone Encounter (Signed)
Patient calling in  Would like call back from nurse to discuss lab results   Please call [phone]

## 2021-03-23 NOTE — Telephone Encounter (Signed)
Pt has been informed of results and expressed understanding.  She wants an explanation on why the UA had 2 flags on it.   She also would like to know the results of her ultrasound.   Please advise.

## 2021-03-26 ENCOUNTER — Encounter: Payer: Self-pay | Admitting: Internal Medicine

## 2021-04-02 ENCOUNTER — Ambulatory Visit (HOSPITAL_COMMUNITY)
Admission: RE | Admit: 2021-04-02 | Discharge: 2021-04-02 | Disposition: A | Discharge status: Home / Self Care | Payer: 59 | Source: Ambulatory Visit | Attending: Physician Assistant | Admitting: Physician Assistant

## 2021-04-02 ENCOUNTER — Other Ambulatory Visit: Payer: Self-pay

## 2021-04-02 DIAGNOSIS — M79604 Pain in right leg: Secondary | ICD-10-CM | POA: Insufficient documentation

## 2021-04-02 DIAGNOSIS — M79605 Pain in left leg: Secondary | ICD-10-CM | POA: Diagnosis present

## 2021-04-03 ENCOUNTER — Ambulatory Visit
Admission: RE | Admit: 2021-04-03 | Discharge: 2021-04-03 | Disposition: A | Discharge status: Home / Self Care | Payer: 59 | Source: Ambulatory Visit | Attending: Internal Medicine | Admitting: Internal Medicine

## 2021-04-03 DIAGNOSIS — R10811 Right upper quadrant abdominal tenderness: Secondary | ICD-10-CM

## 2021-04-04 ENCOUNTER — Other Ambulatory Visit: Payer: Self-pay | Admitting: Internal Medicine

## 2021-04-04 DIAGNOSIS — K8020 Calculus of gallbladder without cholecystitis without obstruction: Secondary | ICD-10-CM | POA: Insufficient documentation

## 2021-04-11 ENCOUNTER — Ambulatory Visit (INDEPENDENT_AMBULATORY_CARE_PROVIDER_SITE_OTHER): Payer: 59 | Admitting: Physician Assistant

## 2021-04-11 ENCOUNTER — Other Ambulatory Visit: Payer: Self-pay

## 2021-04-11 VITALS — BP 105/61 | HR 78 | Temp 98.1°F | Resp 20 | Ht 64.0 in | Wt 117.0 lb

## 2021-04-11 DIAGNOSIS — I83893 Varicose veins of bilateral lower extremities with other complications: Secondary | ICD-10-CM | POA: Diagnosis not present

## 2021-04-11 DIAGNOSIS — I872 Venous insufficiency (chronic) (peripheral): Secondary | ICD-10-CM | POA: Diagnosis not present

## 2021-04-11 DIAGNOSIS — M79604 Pain in right leg: Secondary | ICD-10-CM | POA: Diagnosis not present

## 2021-04-11 DIAGNOSIS — M79605 Pain in left leg: Secondary | ICD-10-CM | POA: Diagnosis not present
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
## 2021-04-23 ENCOUNTER — Telehealth: Payer: Self-pay | Admitting: Internal Medicine

## 2021-04-23 NOTE — Telephone Encounter (Signed)
Patient is requesting a rheumatology referral to Dr. Clint Guy. at  [facility]   Patient states provider does accept her insurance  Please advise

## 2021-04-23 NOTE — Telephone Encounter (Signed)
Patient states she is having an ammonia taste   Patient is requesting a call back to discuss symptom

## 2021-04-26 ENCOUNTER — Other Ambulatory Visit: Payer: Self-pay | Admitting: Internal Medicine

## 2021-04-26 DIAGNOSIS — M797 Fibromyalgia: Secondary | ICD-10-CM

## 2021-04-26 NOTE — Telephone Encounter (Signed)
Patient says she would like new referral submitted to:  Dr. Francee Gentile [facility] [address] [place], [place] [address]  Patient says her arthritis has been giving her a severe problem recently   Please let patient know when referral has been submitted [phone]

## 2021-05-01 ENCOUNTER — Encounter: Payer: Self-pay | Admitting: Internal Medicine

## 2021-05-01 ENCOUNTER — Encounter (HOSPITAL_COMMUNITY): Payer: 59

## 2021-05-18 ENCOUNTER — Telehealth: Payer: Self-pay

## 2021-05-18 NOTE — Telephone Encounter (Signed)
Patient calling in  Calling to check status of referral to Rheumatoid specialist  Please advise patient of status

## 2021-05-18 NOTE — Telephone Encounter (Signed)
No need for abx prophylaxis

## 2021-05-18 NOTE — Telephone Encounter (Signed)
Pt aware.

## 2021-05-18 NOTE — Telephone Encounter (Signed)
Patient called and asked if she needs to be on antibiotics for upcoming dental cleaning. Appt is 05/22/21. If needed pharmacy on file is correct.

## 2021-05-24 NOTE — Telephone Encounter (Signed)
Patient checking status of referral, informed patient referral was re-faxed today  Patient states Dr. Sharmon Revere office has not received the fax  Patient requesting fax sent to [phone]  Please advise *see below*

## 2021-05-31 ENCOUNTER — Encounter: Payer: Self-pay | Admitting: Internal Medicine

## 2021-05-31 ENCOUNTER — Ambulatory Visit (INDEPENDENT_AMBULATORY_CARE_PROVIDER_SITE_OTHER): Payer: 59 | Admitting: Internal Medicine

## 2021-05-31 ENCOUNTER — Other Ambulatory Visit: Payer: 59

## 2021-05-31 VITALS — BP 100/60 | HR 72 | Ht 64.0 in | Wt 116.0 lb

## 2021-05-31 DIAGNOSIS — Z1211 Encounter for screening for malignant neoplasm of colon: Secondary | ICD-10-CM | POA: Diagnosis not present

## 2021-05-31 DIAGNOSIS — K219 Gastro-esophageal reflux disease without esophagitis: Secondary | ICD-10-CM | POA: Diagnosis not present

## 2021-05-31 DIAGNOSIS — R140 Abdominal distension (gaseous): Secondary | ICD-10-CM | POA: Diagnosis not present

## 2021-05-31 DIAGNOSIS — R1310 Dysphagia, unspecified: Secondary | ICD-10-CM

## 2021-05-31 MED ORDER — OMEPRAZOLE 40 MG PO CPDR: 40.0000 mg | DELAYED_RELEASE_CAPSULE | Freq: Every day | ORAL | 3 refills | Status: DC

## 2021-05-31 NOTE — Patient Instructions (Addendum)
If you are age 57 or older, your body mass index should be between 23-30. Your Body mass index is 19.91 kg/m. If this is out of the aforementioned range listed, please consider follow up with your Primary Care Provider.  If you are age 74 or younger, your body mass index should be between 19-25. Your Body mass index is 19.91 kg/m. If this is out of the aformentioned range listed, please consider follow up with your Primary Care Provider.  Follow [employer] diet.  We have sent the following medications to your pharmacy for you to pick up at your convenience: Omeprazole 40 mg daily.  Your provider has ordered Cologuard testing as an option for colon cancer screening. This is performed by [employer] and may be out of network with your insurance. PRIOR to completing the test, it is YOUR responsibility to contact your insurance about covered benefits for this test. Your out of pocket expense could be anywhere from $0.00 to $649.00.   When you call to check coverage with your insurer, please provide the following information:   -The ONLY provider of Cologuard is [place] code for Cologuard is [phone].  [employer] NPI # [id number]  -[employer] Tax ID # [id number]   We have already sent your demographic and insurance information to [employer] (phone number [phone]) and they should contact you within the next week regarding your test. If you have not heard from them within the next week, please call our office at [phone].      The [facility] providers would like to encourage you to use [facility] to communicate with providers for non-urgent requests or questions.  Due to long hold times on the telephone, sending your provider a message by [facility] may be a faster and more efficient way to get a response.  Please allow 48 business hours for a response.  Please remember that this is for non-urgent requests.   It was a pleasure to see  you today!  Thank you for trusting me with your gastrointestinal care!    Dayna Barker, MD

## 2021-05-31 NOTE — Progress Notes (Signed)
Chief Complaint: Dysphagia, GERD  HPI : 56 year old female with history of emphysema, GERD, fibromyalgia, and PTSD presents with dysphagia and GERD  She has had issues with bloating and dysphagia that have been going on for months.  She feels like she is bloated regardless of what she eats. She feels uncomfortable all the time. When she is bloated, she does have some abdominal discomfort. She always feels full and has early satiety. As a result, she has to eat small meals. She tastes ammonia 1 times. She has trouble swallowing with mostly liquids where she experiences choking episodes. Endorses issues with heartburn. She has felt some foam that comes up in her throat on occasion. She thought at one point that she was eating too much peanut butter, which was causing her to have paste-like stools. She will see a surgeon soon about gallstones that she was diagnosed with to see if she needs a cholecystectomy. She has one BM every day. Sometimes she will go twice a day. When she wipes, it is hard to get herself clean. Denies blood in stools or weight loss. Denies ab pain. Denies fam hx of GI cancers. Her mother had IBS. Denies prior EGD or colonoscopy. Denies taking any medications for reflux symptoms.  She follows a diet that is heavy in roughage.  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  Patient is [place].  Wt Readings from Last 3 Encounters:  05/31/21 116 lb (52.6 kg)  04/11/21 117 lb (53.1 kg)  03/19/21 117 lb (53.1 kg)   Past Medical History:  Diagnosis Date   Allergic rhinitis    Allergy    Brain fog    Breast mass 08/23/2010   Depression    Eating disorder    Emphysema of lung (HCC)    Fibrocystic breast    Fibromyalgia    Fibromyalgia    GERD (gastroesophageal reflux disease)    Heart murmur    Hyperlipidemia    MVP (mitral valve prolapse)    Osteoarthritis    Ovarian cyst     PAC (premature atrial contraction)    Positive PPD    PTSD (post-traumatic stress disorder)    PVC (premature ventricular contraction)    UTI (urinary tract infection)    UTI (urinary tract infection)     Past Surgical History:  Procedure Laterality Date   CERVICAL POLYPECTOMY     MAXILLARY SINUS LIFT     REPLANTATION THUMB     SINUS LIFT WITH BONE GRAFT     TUBAL LIGATION     Family History  Problem Relation Age of Onset   Arthritis Mother    Hearing loss Mother    Mental illness Mother    Irritable bowel syndrome Mother    Allergies Father    Alcohol abuse Father    Drug abuse Sister    Mental illness Sister    Heart disease Neg Hx    Hypertension Neg Hx    Diabetes Neg Hx    Cancer Neg Hx        breast or colon   Stroke Neg Hx    Colon cancer Neg Hx    Social History   Tobacco Use   Smoking status: Former    Packs/day: 1.00    Years: 35.00    Pack years: 35.00    Types: Cigarettes    Quit date: 2015  Years since quitting: 8.0   Smokeless tobacco: Never  Vaping Use   Vaping Use: Never used  Substance Use Topics   Alcohol use: No   Drug use: No   No current outpatient medications on file.   No current facility-administered medications for this visit.   Allergies  Allergen Reactions   Latex Itching   Review of Systems: All systems reviewed and negative except where noted in HPI.   Physical Exam: BP 100/60    Pulse 72    Ht 5\' 4"  (1.626 m)    Wt 116 lb (52.6 kg)    LMP 03/24/2015 (Approximate)    BMI 19.91 kg/m  Constitutional: Pleasant,well-developed, female in no acute distress. HEENT: Normocephalic and atraumatic. Conjunctivae are normal. No scleral icterus. Cardiovascular: Normal rate, regular rhythm.  Pulmonary/chest: Effort normal and breath sounds normal. No wheezing, rales or rhonchi. Abdominal: Soft, nondistended, nontender. Bowel sounds active throughout. There are no masses palpable. No hepatomegaly. Extremities: No  edema Neurological: Alert and oriented to person place and time. Skin: Skin is warm and dry. No rashes noted. Psychiatric: Normal mood and affect. Behavior is normal.  Labs 02/2021: CBC, CMP, TSH unremarkable.  RUQ U/S 04/03/21: IMPRESSION: Cholelithiasis without evidence of acute cholecystitis.  TTE 07/28/20: Normal LV systolic function with visual EF 55-60%. Left ventricle cavity  is normal in size. Normal global wall motion. Normal diastolic filling  pattern, normal LAP.  Mild (Grade I) mitral regurgitation.  Mild tricuspid regurgitation. No evidence of pulmonary hypertension.  IVC is dilated with a respiratory response of <50%.   ASSESSMENT AND PLAN:  GERD Dysphagia Abdominal bloating Cholelithiasis Colon cancer screening Presents with issues with abdominal bloating, dysphagia, and GERD that have been going on for the last several months.  Patient does suspect that it may be related to her diet and anxiety, and her mother had issues with IBS.  Thus I will have the patient try to follow a low FODMAP diet to see if this helps with her abdominal bloating and discomfort.  With regard to the reflux and dysphagia issues, we will do a trial of omeprazole 40 mg daily to see if this helps with her symptoms at all.  I did suggest an EGD during our discussion today, but the patient is not interested at this time.  For colon cancer screening the patient would like to do a Cologuard test instead of a colonoscopy at this time.  We will also check for H. pylori to make sure that it is not contributing to her bloating symptoms. - Low FODMAP diet - Referral to nutritionist for low FODMAP diet - Start omeprazole 40 mg QD - Check stool H pylori antigen and Cologuard - Patient refused EGD at this time - RTC in 2 months.  Consider GES or pelvic floor PT in the future  09/27/20, MD  I spent 61 minutes of time, including in depth chart review, independent review of results as outlined above,  communicating results with the patient directly, face-to-face time with the patient, coordinating care, ordering studies and medications as appropriate, and documentation.

## 2021-06-07 IMAGING — MG DIGITAL DIAGNOSTIC BILAT W/ TOMO W/ CAD
6 of 10 series · 6 of 30 positions shown · non-contrast
Comparison: Previous exam(s).

CLINICAL DATA: Focal pain in the left breast.

EXAM:
DIGITAL DIAGNOSTIC BILATERAL MAMMOGRAM WITH CAD AND TOMO
ULTRASOUND LEFT BREAST

[R CC synth-2D]
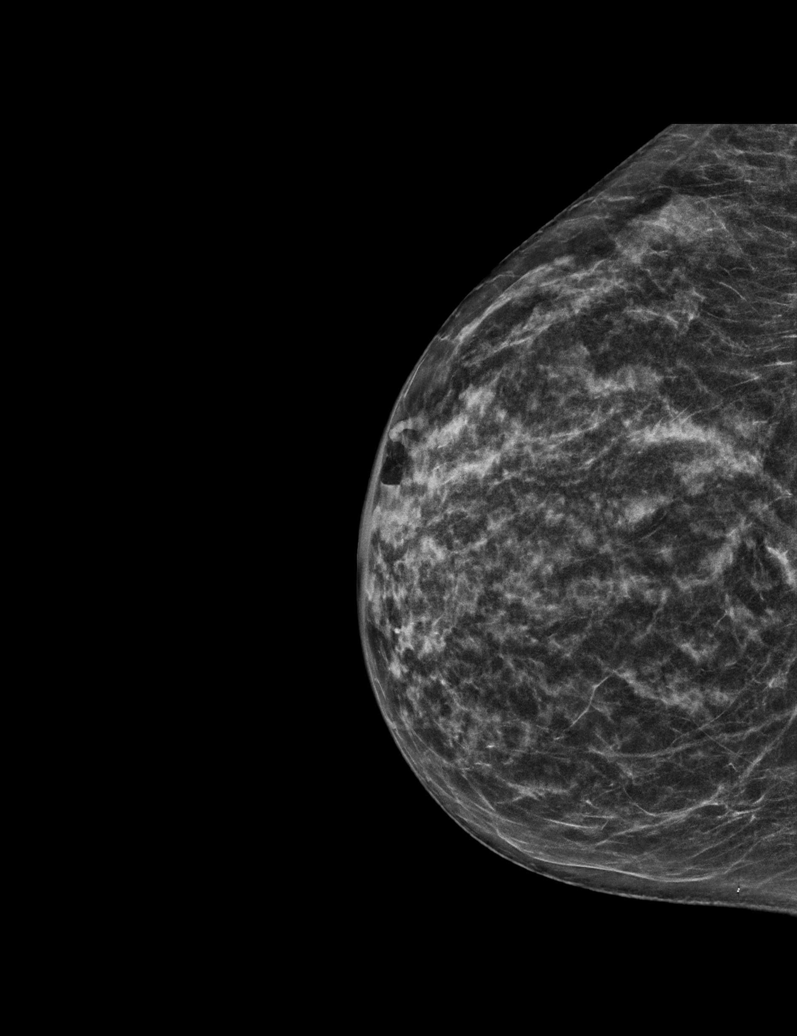

[L TAN synth-2D]
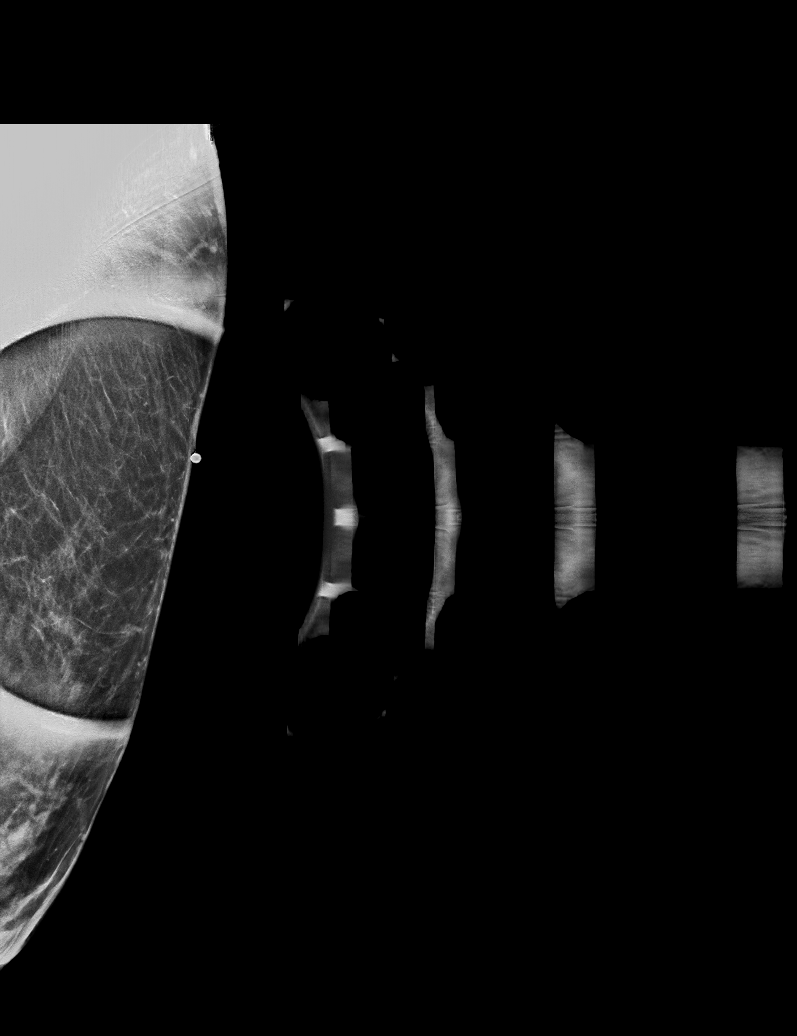

[L MLO synth-2D]
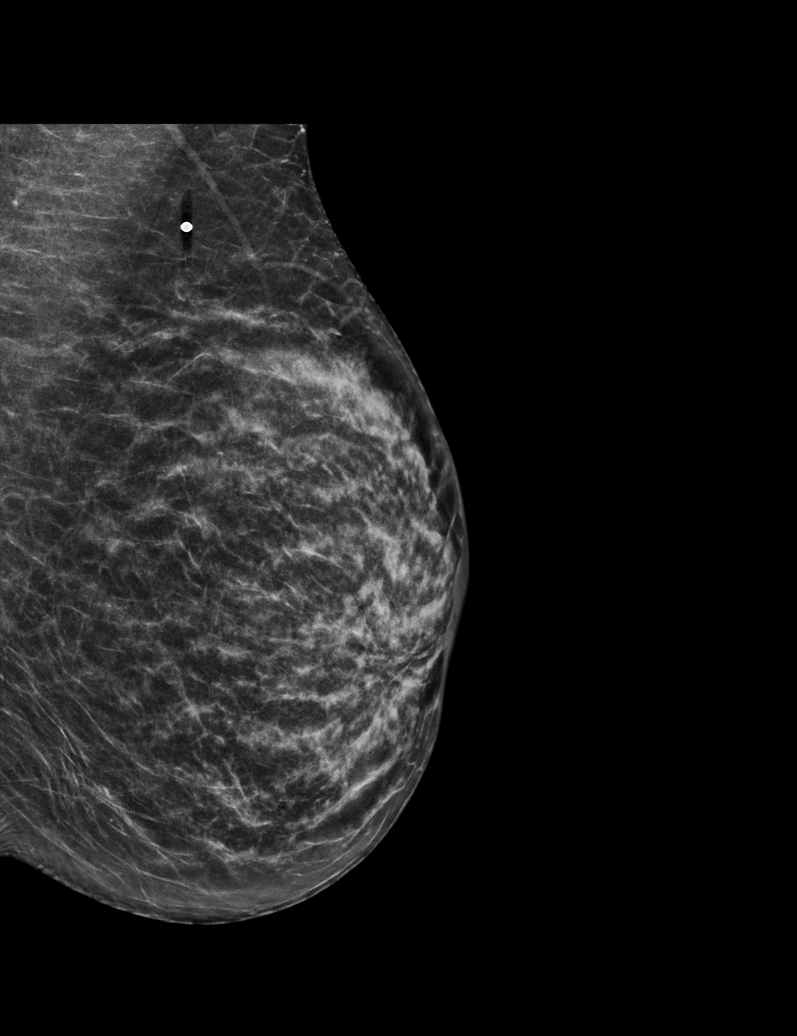

[L CC synth-2D]
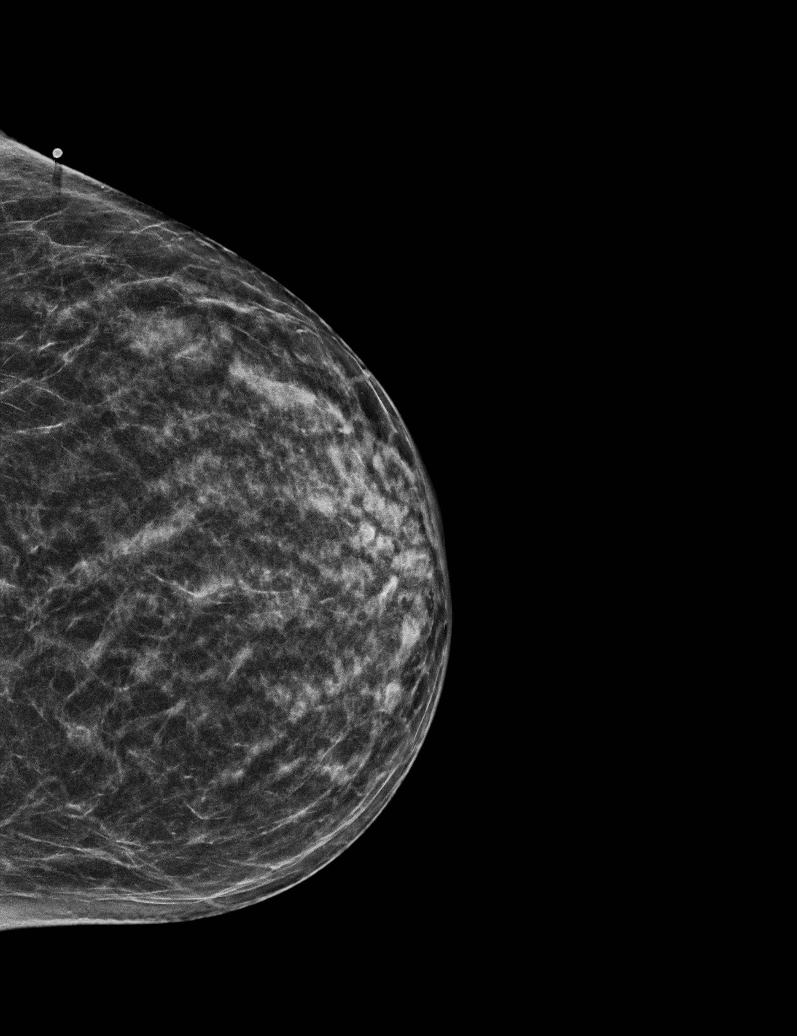

[R MLO synth-2D]
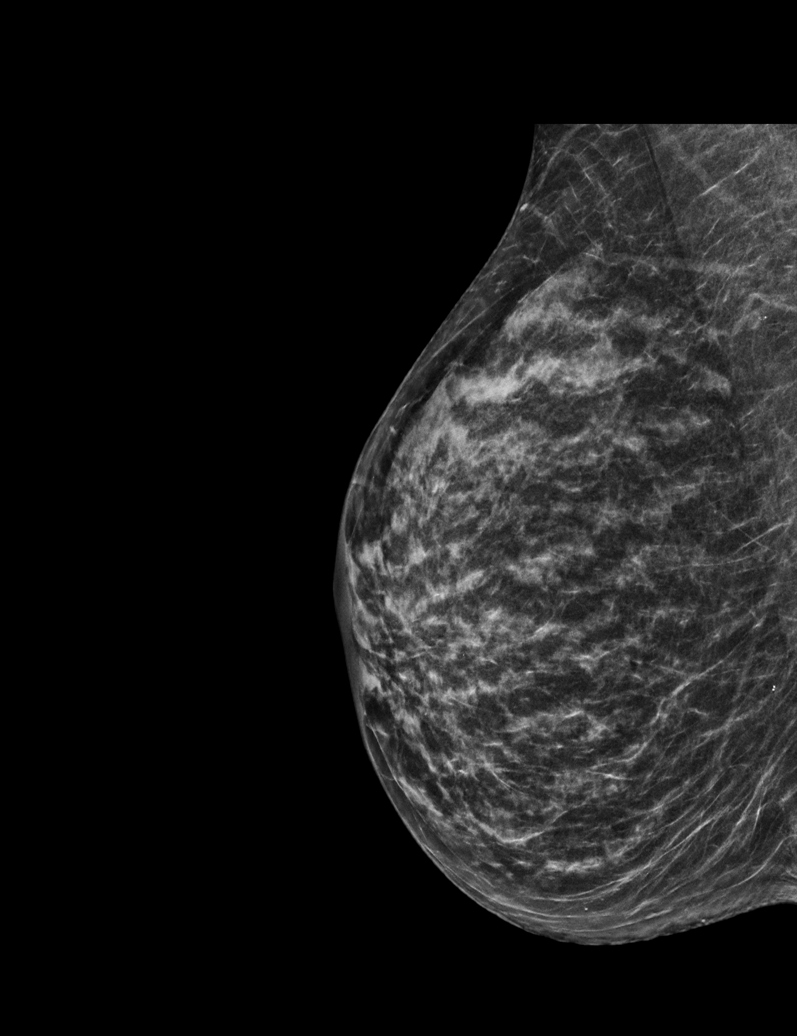

[L MLO tomo · tomo slice 26/51.0]
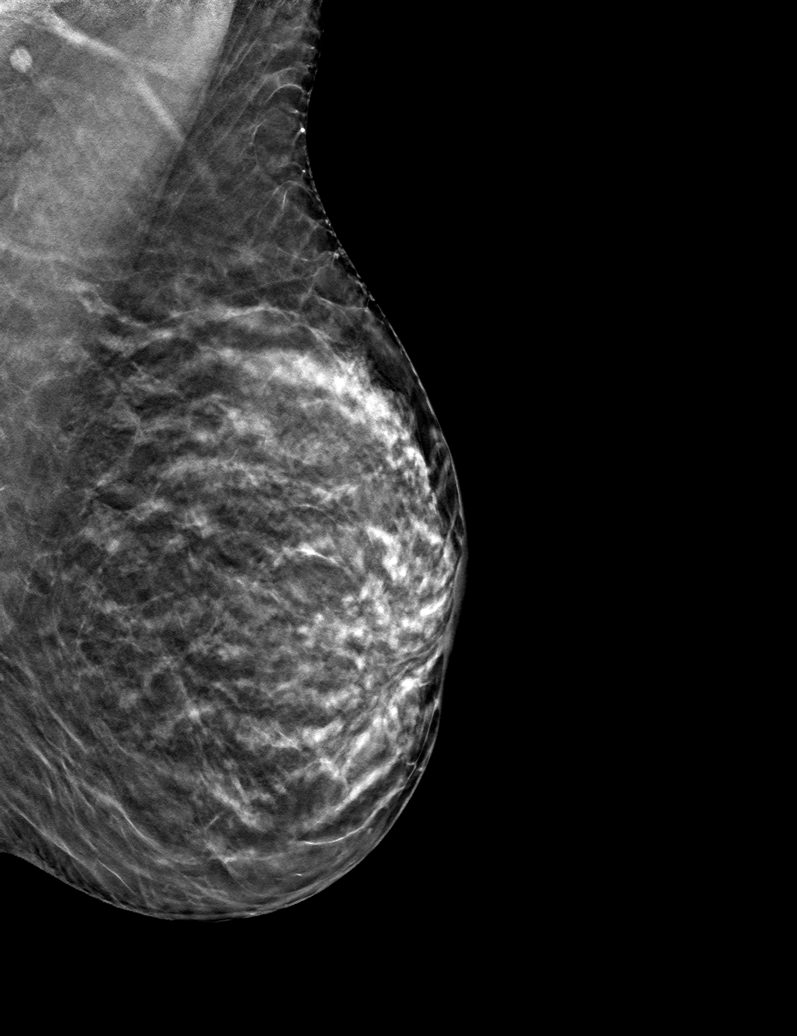

[6 of 30 positions shown; findings below may reference images not displayed]

ACR Breast Density Category c: The breast tissue is heterogeneously
dense, which may obscure small masses.
FINDINGS: No suspicious masses, calcifications, or distortion are identified
in either breast.

Mammographic images were processed with CAD.

On physical exam, no suspicious lumps are identified.

Targeted ultrasound is performed, showing no sonographic
abnormalities in the region of the patient's focal pain.
IMPRESSION: No mammographic or sonographic evidence of malignancy.

RECOMMENDATION:
Annual screening mammography. Treatment of the patient's symptoms
should be based on clinical and physical exam given lack of imaging
findings.

I have discussed the findings and recommendations with the patient.
If applicable, a reminder letter will be sent to the patient
regarding the next appointment.

BI-RADS CATEGORY  2: Benign.

## 2021-06-12 ENCOUNTER — Other Ambulatory Visit: Payer: 59

## 2021-06-12 DIAGNOSIS — R1310 Dysphagia, unspecified: Secondary | ICD-10-CM

## 2021-06-12 DIAGNOSIS — K219 Gastro-esophageal reflux disease without esophagitis: Secondary | ICD-10-CM

## 2021-06-12 DIAGNOSIS — Z1211 Encounter for screening for malignant neoplasm of colon: Secondary | ICD-10-CM

## 2021-06-12 DIAGNOSIS — R140 Abdominal distension (gaseous): Secondary | ICD-10-CM

## 2021-06-14 LAB — H. PYLORI ANTIGEN, STOOL: H pylori Ag, Stl: NEGATIVE

## 2021-06-21 ENCOUNTER — Telehealth: Payer: Self-pay | Admitting: Internal Medicine

## 2021-06-21 NOTE — Telephone Encounter (Signed)
Patient called and wanted to let you know she finally got an appointment with a nutritionist.  Her name is Katherine Cruz and she is with [facility].  Patient's appointment is scheduled for April 27.  She states they do not require a referral; however, if you wanted to send information regarding patient to please do so. Thank you.

## 2021-06-27 LAB — COLOGUARD: COLOGUARD: NEGATIVE

## 2021-06-29 ENCOUNTER — Encounter: Payer: Self-pay | Admitting: Internal Medicine

## 2021-06-29 ENCOUNTER — Other Ambulatory Visit: Payer: Self-pay

## 2021-06-29 ENCOUNTER — Ambulatory Visit (INDEPENDENT_AMBULATORY_CARE_PROVIDER_SITE_OTHER): Payer: 59 | Admitting: Internal Medicine

## 2021-06-29 VITALS — BP 118/70 | HR 76 | Resp 18 | Ht 64.0 in | Wt 117.0 lb

## 2021-06-29 DIAGNOSIS — R41840 Attention and concentration deficit: Secondary | ICD-10-CM | POA: Diagnosis not present

## 2021-06-29 DIAGNOSIS — F411 Generalized anxiety disorder: Secondary | ICD-10-CM

## 2021-06-29 NOTE — Progress Notes (Signed)
° °  Subjective:   Patient ID: Katherine Cruz, female    DOB: [birth year], 56 y.o.   MRN: [id number]  HPI The patient is a 56 YO female coming in for needing full psychological workup to determine ADD/ADHD or possible autism spectrum. Her therapist has recommended she get this.   Review of Systems  Constitutional: Negative.   HENT: Negative.    Eyes: Negative.   Respiratory:  Negative for cough, chest tightness and shortness of breath.   Cardiovascular:  Negative for chest pain, palpitations and leg swelling.  Gastrointestinal:  Negative for abdominal distention, abdominal pain, constipation, diarrhea, nausea and vomiting.  Musculoskeletal: Negative.   Skin: Negative.   Neurological: Negative.   Psychiatric/Behavioral: Negative.     Objective:  Physical Exam Constitutional:      Appearance: She is well-developed.  HENT:     Head: Normocephalic and atraumatic.  Cardiovascular:     Rate and Rhythm: Normal rate and regular rhythm.  Pulmonary:     Effort: Pulmonary effort is normal. No respiratory distress.     Breath sounds: Normal breath sounds. No wheezing or rales.  Abdominal:     General: Bowel sounds are normal. There is no distension.     Palpations: Abdomen is soft.     Tenderness: There is no abdominal tenderness. There is no rebound.  Musculoskeletal:     Cervical back: Normal range of motion.  Skin:    General: Skin is warm and dry.  Neurological:     Mental Status: She is alert and oriented to person, place, and time.     Coordination: Coordination normal.    Vitals:   06/29/21 1341  BP: 118/70  Pulse: 76  Resp: 18  SpO2: 97%  Weight: 117 lb (53.1 kg)  Height: 5\' 4"  (1.626 m)    This visit occurred during the SARS-CoV-2 public health emergency.  Safety protocols were in place, including screening questions prior to the visit, additional usage of staff PPE, and extensive cleaning of exam room while observing appropriate contact time as indicated for disinfecting  solutions.   Assessment & Plan:

## 2021-06-29 NOTE — Assessment & Plan Note (Signed)
Concern from therapist about ADD/ADHD and autism spectrum so she needs psychological assessment. Referral to psychiatry.

## 2021-06-29 NOTE — Patient Instructions (Signed)
We will get you in for assessment.

## 2021-07-10 ENCOUNTER — Encounter: Payer: 59 | Admitting: Internal Medicine

## 2021-07-16 ENCOUNTER — Telehealth: Payer: Self-pay | Admitting: Internal Medicine

## 2021-07-16 NOTE — Telephone Encounter (Signed)
Pt is stating that [facility] does not off the full assessment that Pt is seeking. Referral would have to go to another place.  Pt CB [phone]

## 2021-07-16 NOTE — Telephone Encounter (Signed)
Pt states she was to have a referral placed to [facility] at [facility] on 06-29-2021 by Dr. Sharlet Salina  I was unable to locate the referral, pt requesting a c/b

## 2021-07-19 NOTE — Telephone Encounter (Signed)
Called pt to see if there was a certain place in mind she would like the referral to go to, pt didn't know so I told her I will contact the referrals coordinator to see if she has an idea.  ? ?After speaking with the referrals coordinator, I sent pt a MyChart message recommending that she calls around to see which office offers the assessment she is looking for and to let us know if they need a referral as some places allow pts to book for themselves.  ?

## 2021-07-30 NOTE — Telephone Encounter (Signed)
I have already contacted referrals per my message and note on 07/19/21 and they do not know which offices offer the ADD/ADHD or possible autism spectrum assessment she is directly looking for. I have contacted [facility] and they recommended [facility], but they were unsure if they tested for autism as well. Pt is refusing to find out herself.  ? ?Do you have an idea of anywhere else that may run these tests? ?

## 2021-07-30 NOTE — Telephone Encounter (Signed)
Pt is calling back to say she is still waiting on the referral for the full psychological assessments. Pt states its not up to her to find where the referral needs to go. Pt  would like the referral coordinator or the doctor  to figure that out and let her know where the referral will be sent. ? ?Please advise ? ?Pt CB [phone] ?

## 2021-07-31 NOTE — Telephone Encounter (Signed)
I do not and would recommend that she find out what assessment is requested as her therapist recommended a specific assessment to her that therapist may know where performs such assessments.  ?

## 2021-08-15 NOTE — Therapy (Signed)
?OUTPATIENT PHYSICAL THERAPY LOWER EXTREMITY EVALUATION ? ? ?Patient Name: Katherine Cruz ?MRN: [id number] ?DOB:11/27/1965, 56 y.o., female ?Today's Date: 08/16/2021 ? ? PT End of Session - 08/16/21 1348   ? ? Visit Number 1   ? Number of Visits 17   ? Date for PT Re-Evaluation 10/11/21   ? Authorization Type UHC dual complete   ? Authorization Time Period FOTO v6, v10, KX modifier v15   ? Progress Note Due on Visit 10   ? PT Start Time 1255   ? PT Stop Time 1350   ? PT Time Calculation (min) 55 min   ? Activity Tolerance Patient tolerated treatment well   ? Behavior During Therapy [facility] for tasks assessed/performed   ? ?  ?  ? ?  ? ? ?Past Medical History:  ?Diagnosis Date  ? Allergic rhinitis   ? Allergy   ? Brain fog   ? Breast mass 08/23/2010  ? Depression   ? Eating disorder   ? Emphysema of lung (HCC)   ? Fibrocystic breast   ? Fibromyalgia   ? Fibromyalgia   ? GERD (gastroesophageal reflux disease)   ? Heart murmur   ? Hyperlipidemia   ? MVP (mitral valve prolapse)   ? Osteoarthritis   ? Ovarian cyst   ? PAC (premature atrial contraction)   ? Positive PPD   ? PTSD (post-traumatic stress disorder)   ? PVC (premature ventricular contraction)   ? UTI (urinary tract infection)   ? UTI (urinary tract infection)   ? ?Past Surgical History:  ?Procedure Laterality Date  ? CERVICAL POLYPECTOMY    ? MAXILLARY SINUS LIFT    ? REPLANTATION THUMB    ? SINUS LIFT WITH BONE GRAFT    ? TUBAL LIGATION    ? ?Patient Active Problem List  ? Diagnosis Date Noted  ? Multiple gallstones 04/04/2021  ? Right upper quadrant abdominal tenderness without rebound tenderness 03/19/2021  ? Estrogen deficiency 12/18/2020  ? Colon cancer screening 12/18/2020  ? Visit for screening mammogram 12/18/2020  ? MVP (mitral valve prolapse) 12/18/2020  ? Other emphysema (HCC) 12/18/2020  ? GAD (generalized anxiety disorder) 12/18/2020  ? Claudication of lower extremity (HCC) 12/18/2020  ? Encounter for general adult medical examination with abnormal findings  12/18/2020  ? Need for shingles vaccine 12/15/2019  ? Mixed hyperlipidemia 12/03/2019  ? Onychomycosis 12/03/2019  ? Tremor of both hands 11/28/2019  ? Fibromyalgia 08/31/2015  ? Generalized osteoarthritis of multiple sites 01/03/2014  ? Atopic rhinitis 11/24/2009  ? ? ?PCP: Etta Grandchild, MD ? ?REFERRING PROVIDER: Williemae Natter, PA-C ? ?REFERRING DIAG: chronic hip ? ?THERAPY DIAG:  ?Pain in left hip ? ?Muscle weakness (generalized) ? ?Difficulty in walking, not elsewhere classified ? ?Pain in left foot ? ?Pain in right foot ? ?ONSET DATE: 5-7 years ago ? ?SUBJECTIVE:  ? ?SUBJECTIVE STATEMENT: ?Pt reports primary c/o chronic Lt hip pain of insidious onset lasting several years. She reports the pain is a sharp, pinching pain in the anterior Lt hip into the groin. Pt denies any N/T or popping/ clicking of the hip. Pt reports whole-body morning stiffness lasting about 30 minutes. Aggravating factors include walking >20 minutes, getting into her car, and walking after prolonged sitting. Easing factors include hip distraction and rest. Current pain is 4/10. Worst pain is 9/10. Best pain is 0/10. Pt also adds that she has severe BIL foot pain when her toes "fan out" when walking. She reports the pain is at the top of  her feet and that she feels like her feet flatten more than they should when walking. ? ?PERTINENT HISTORY: ?Anxiety, depression, heart arrhythmias, OA ? ?PAIN:  ?Are you having pain? Yes: NPRS scale: 4/10 ?Pain location: Lt anterior hip ?Pain description: Lambert Mody ?Aggravating factors: walking >20 minutes, getting into her car, and walking after prolonged sitting ?Relieving factors: hip distraction and rest ? ?PRECAUTIONS: None ? ?WEIGHT BEARING RESTRICTIONS No ? ?FALLS:  ?Has patient fallen in last 6 months? No ? ?LIVING ENVIRONMENT: ?Lives with: lives alone ?Lives in: House/apartment ?Stairs: No ?Has following equipment at home: None ? ?OCCUPATION: On disability ? ?PLOF: Independent ? ?PATIENT GOALS Return  to hiking, yard work, house work, walking ? ?Screening for Suicide ? ?Answer the following questions with Yes or No and place an "x" beside the action taken. ? ?1. Over the past two weeks, have you felt down, depressed, or hopeless?   Yes ? ?2. Within the past two weeks, have you felt little interest or pleasure in life?  Yes ? ?If YES to either #1 or #2, then ask #3 ? ?3. Have you had thoughts that life is not worth living or that you might be   ?    better off dead?   No ? ?If answer is NO and suspicion is low, then end ? ? ?4. Over this past week, have you had any thoughts about hurting or even killing yourself?   ? ?If NO, then end. Patient in no immediate danger ? ? ?5. If so, do you believe that you intend to or will harm yourself?   ? ?   If NO, then end. Patient in no immediate danger ? ? ?6.  Do you have a plan as to how you would hurt yourself?   ? ? ?7.  Over this past week, have you actually done anything to hurt yourself?  ? ? ?IF YES answers to either #4, #5, #6 or #7, then patient is AT RISK for suicide ? ? ?Actions Taken ? ?__X__  Screening negative; no further action required ? ?____  Screening positive; no immediate danger and patient already in treatment with a ? mental health provider. Advise patient to speak to their mental health provider. ? ?____  Screening positive; no immediate danger. Patient advised to contact a mental ? health provider for further assessment.  ? ?____  Screening positive; in immediate danger as patient states intention of killing self, ? has plan and a sense of imminence. Do not leave alone. Seek permission from ? patient to contact a family member to inform them. Direct patient to go to ED.  ? ?OBJECTIVE:  ? ?DIAGNOSTIC FINDINGS: 04/20/2020: DG Hip unilat with or without pelvis 2-3 views Left: IMPRESSION: Negative ? ?07/20/2021: DG pelvis: IMPRESSION: Lt>Rt hip impingement (From [facility] paperwork brought by patient) ? ?PATIENT SURVEYS:  ?FOTO 47%, projected 64% in 13  visits ? ?COGNITION: ? Overall cognitive status: Within functional limits for tasks assessed   ?  ?SENSATION: ?WFL ? ?MUSCLE LENGTH: ?Hamstrings: WNL BIL ?Thomas test: WNL ? ?POSTURE:  ?BIL genu valgum with hip adduction ? ?PALPATION: ?No TTP to superficial hip structure ? ?PASSIVE ACCESSORIES: ?Lumbar CPAs unremarkable ? ?LE ROM: ? ?A/PROM Right ?08/16/2021 Left ?08/16/2021  ?Hip flexion 130/150 120/140p!  ?Hip abduction 50/65 40/60p!  ?Hip adduction 30/35 22/25p!  ?Hip internal rotation 30/40 28/35p!  ?Hip external rotation 30/50 29/40 mild pain  ? (Blank rows = not tested) ? ?LE MMT: ? ?MMT Right ?08/16/2021 Left ?08/16/2021  ?  Hip flexion 5/5 5/5  ?Hip extension 4/5 4/5  ?Hip abduction 4/5 3+/5  ?Hip internal rotation 5/5 5/5  ?Hip external rotation 5/5 4/5  ? (Blank rows = not tested) ? ?LOWER EXTREMITY SPECIAL TESTS:  ?Hip scouring: (+) on Lt ?FABER: (+) on Lt ?Hip de-rotation test: (-) ?FADIR: (+) on Lt ? ?FUNCTIONAL TESTS:  ?Squat: full-depth, mild BIL hip pain ?SL Squat: increased hip addution on stance leg BIL, 50% depth ?Lunge: adduction of front leg BIL, mild BIL hip pain ? ?GAIT: ?Distance walked: 7820ft ?Assistive device utilized: None ?Level of assistance: Complete Independence ?Comments: BIL hip adduction with genu valgum ? ? ? ?TODAY'S TREATMENT: ?Issued and demonstrated HEP ? ? ?PATIENT EDUCATION:  ?Education details: Pt educated on probable underlying pathophysiology behind her pain presentation, prognosis, POC, FOTO, and HEP ?Person educated: Patient ?Education method: Explanation, Demonstration, and Handouts ?Education comprehension: verbalized understanding and returned demonstration ? ? ?HOME EXERCISE PROGRAM: ?Access Code: [email or web address] ?URL: [email or web address] ?Date: 08/16/2021 ?Prepared by: Carmelina Daneucker Jakirah Zaun ? ?Exercises ?- Squat with Chair Touch  - 1 x daily - 7 x weekly - 3 sets - 10 reps ?- Side Plank with Clam  - 1 x daily - 7 x weekly - 2 sets - 10 reps ?- Supine Bridge  - 1 x  daily - 7 x weekly - 3 sets - 10 reps - 3-sec hold ? ?ASSESSMENT: ? ?CLINICAL IMPRESSION: ?Patient is a 56 y.o. F who was seen today for physical therapy evaluation and treatment for chronic Lt hip pain.  The p

## 2021-08-16 ENCOUNTER — Other Ambulatory Visit: Payer: Self-pay

## 2021-08-16 ENCOUNTER — Ambulatory Visit: Payer: 59 | Attending: Physician Assistant

## 2021-08-16 DIAGNOSIS — R262 Difficulty in walking, not elsewhere classified: Secondary | ICD-10-CM | POA: Insufficient documentation

## 2021-08-16 DIAGNOSIS — M25552 Pain in left hip: Secondary | ICD-10-CM | POA: Insufficient documentation

## 2021-08-16 DIAGNOSIS — M79672 Pain in left foot: Secondary | ICD-10-CM | POA: Diagnosis present

## 2021-08-16 DIAGNOSIS — M79671 Pain in right foot: Secondary | ICD-10-CM | POA: Diagnosis present

## 2021-08-16 DIAGNOSIS — M6281 Muscle weakness (generalized): Secondary | ICD-10-CM | POA: Insufficient documentation

## 2021-09-05 NOTE — Therapy (Incomplete)
?OUTPATIENT PHYSICAL THERAPY TREATMENT NOTE ? ? ?Patient Name: Katherine Cruz ?MRN: [id number] ?DOB:[birth year], 56 y.o., female ?Today's Date: 09/05/2021 ? ?PCP: Etta Grandchild, MD ?REFERRING PROVIDER: Williemae Natter, PA-C ? ?END OF SESSION:  ? ? ?Past Medical History:  ?Diagnosis Date  ? Allergic rhinitis   ? Allergy   ? Brain fog   ? Breast mass 08/23/2010  ? Depression   ? Eating disorder   ? Emphysema of lung (HCC)   ? Fibrocystic breast   ? Fibromyalgia   ? Fibromyalgia   ? GERD (gastroesophageal reflux disease)   ? Heart murmur   ? Hyperlipidemia   ? MVP (mitral valve prolapse)   ? Osteoarthritis   ? Ovarian cyst   ? PAC (premature atrial contraction)   ? Positive PPD   ? PTSD (post-traumatic stress disorder)   ? PVC (premature ventricular contraction)   ? UTI (urinary tract infection)   ? UTI (urinary tract infection)   ? ?Past Surgical History:  ?Procedure Laterality Date  ? CERVICAL POLYPECTOMY    ? MAXILLARY SINUS LIFT    ? REPLANTATION THUMB    ? SINUS LIFT WITH BONE GRAFT    ? TUBAL LIGATION    ? ?Patient Active Problem List  ? Diagnosis Date Noted  ? Multiple gallstones 04/04/2021  ? Right upper quadrant abdominal tenderness without rebound tenderness 03/19/2021  ? Estrogen deficiency 12/18/2020  ? Colon cancer screening 12/18/2020  ? Visit for screening mammogram 12/18/2020  ? MVP (mitral valve prolapse) 12/18/2020  ? Other emphysema (HCC) 12/18/2020  ? GAD (generalized anxiety disorder) 12/18/2020  ? Claudication of lower extremity (HCC) 12/18/2020  ? Encounter for general adult medical examination with abnormal findings 12/18/2020  ? Need for shingles vaccine 12/15/2019  ? Mixed hyperlipidemia 12/03/2019  ? Onychomycosis 12/03/2019  ? Tremor of both hands 11/28/2019  ? Fibromyalgia 08/31/2015  ? Generalized osteoarthritis of multiple sites 01/03/2014  ? Atopic rhinitis 11/24/2009  ? ? ?REFERRING DIAG: chronic hip ? ?THERAPY DIAG:  ?No diagnosis found. ? ?PERTINENT HISTORY: Anxiety, depression, heart  arrhythmias, OA ? ?PRECAUTIONS: Nonw ? ?SUBJECTIVE: *** ? ?PAIN:  ?Are you having pain? Yes ?NPRS scale: ***/10 ?Pain location: Lt anterior hip ?Pain description: Lambert Mody ?Aggravating factors: walking >20 minutes, getting into her car, and walking after prolonged sitting ?Relieving factors: hip distraction and rest ? ? ? ?OBJECTIVE:  ?  ?DIAGNOSTIC FINDINGS: 04/20/2020: DG Hip unilat with or without pelvis 2-3 views Left: IMPRESSION: Negative ?  ?07/20/2021: DG pelvis: IMPRESSION: Lt>Rt hip impingement (From [facility] paperwork brought by patient) ?  ?PATIENT SURVEYS:  ?FOTO 47%, projected 64% in 13 visits ?  ?COGNITION: ?          Overall cognitive status: Within functional limits for tasks assessed               ?           ?SENSATION: ?WFL ?  ?MUSCLE LENGTH: ?Hamstrings: WNL BIL ?Thomas test: WNL ?  ?POSTURE:  ?BIL genu valgum with hip adduction ?  ?PALPATION: ?No TTP to superficial hip structure ?  ?PASSIVE ACCESSORIES: ?Lumbar CPAs unremarkable ?  ?LE ROM: ?  ?A/PROM Right ?08/16/2021 Left ?08/16/2021  ?Hip flexion 130/150 120/140p!  ?Hip abduction 50/65 40/60p!  ?Hip adduction 30/35 22/25p!  ?Hip internal rotation 30/40 28/35p!  ?Hip external rotation 30/50 29/40 mild pain  ? (Blank rows = not tested) ?  ?LE MMT: ?  ?MMT Right ?08/16/2021 Left ?08/16/2021  ?Hip flexion 5/5 5/5  ?Hip extension  4/5 4/5  ?Hip abduction 4/5 3+/5  ?Hip internal rotation 5/5 5/5  ?Hip external rotation 5/5 4/5  ? (Blank rows = not tested) ?  ?LOWER EXTREMITY SPECIAL TESTS:  ?Hip scouring: (+) on Lt ?FABER: (+) on Lt ?Hip de-rotation test: (-) ?FADIR: (+) on Lt ?  ?FUNCTIONAL TESTS:  ?Squat: full-depth, mild BIL hip pain ?SL Squat: increased hip addution on stance leg BIL, 50% depth ?Lunge: adduction of front leg BIL, mild BIL hip pain ?  ?GAIT: ?Distance walked: 11ft ?Assistive device utilized: None ?Level of assistance: Complete Independence ?Comments: BIL hip adduction with genu valgum ?  ?  ?  ?TODAY'S TREATMENT: ?[facility] Adult PT Treatment:                                                 DATE: 09/06/2021 ?Therapeutic Exercise: ?Nustep level 5 x 5 mins while gathering subjective ?Leg press omega 25# 2x10 ?Standing hip abduction/extension 17.5# 2x10 BIL ?Squats to chair ?Bridge with hip abduction  ?Seated hip adduction ball squeeze with IR RTB  ?Manual Therapy: ?*** ?Neuromuscular re-ed: ?*** ?Therapeutic Activity: ?*** ?Modalities: ?*** ?Self Care: ?*** ? ? ?08/16/2021: ?Issued and demonstrated HEP ?  ?  ?PATIENT EDUCATION:  ?Education details: Pt educated on probable underlying pathophysiology behind her pain presentation, prognosis, POC, FOTO, and HEP ?Person educated: Patient ?Education method: Explanation, Demonstration, and Handouts ?Education comprehension: verbalized understanding and returned demonstration ?  ?  ?HOME EXERCISE PROGRAM: ?Access Code: [email or web address] ?URL: [email or web address] ?Date: 08/16/2021 ?Prepared by: Carmelina Dane ?  ?Exercises ?- Squat with Chair Touch  - 1 x daily - 7 x weekly - 3 sets - 10 reps ?- Side Plank with Clam  - 1 x daily - 7 x weekly - 2 sets - 10 reps ?- Supine Bridge  - 1 x daily - 7 x weekly - 3 sets - 10 reps - 3-sec hold ?  ?ASSESSMENT: ?  ?CLINICAL IMPRESSION: ?*** ? ?Patient is a 56 y.o. F who was seen today for physical therapy evaluation and treatment for chronic Lt hip pain.  The pt also reports BIL dorsal foot pain when walking, but due to time constraints, focus today was on the hip. Upon assessment, her primary impairments include global BIL hip weakness Lt>Rt, decreased global Lt hip AROM with pain with PROM, painful squatting, painful lunging, painful and mechanically altered SL squatting, and BIL genu valgum with hip adduction in stance. Ruling up anterior hip impingement due to positive FADIR and hip scouring, decreased global Lt hip AROM with pain with overpressure, and provided X-rays which indicate probable impingement. Cannot rule out OA contribution due to morning stiffness and ROM  restrictions in a capsular pattern. The pt will benefit from skilled PT to address her primary impairments and return to her prior level of function with less limitation. ?  ?  ?OBJECTIVE IMPAIRMENTS Abnormal gait, decreased mobility, difficulty walking, decreased ROM, decreased strength, hypomobility, impaired flexibility, improper body mechanics, postural dysfunction, and pain.  ?  ?ACTIVITY LIMITATIONS cleaning, community activity, laundry, yard work, and shopping.  ?  ?PERSONAL FACTORS Time since onset of injury/illness/exacerbation and 3+ comorbidities: See medical hx  are also affecting patient's functional outcome.  ?  ?  ?REHAB POTENTIAL: Good ?  ?CLINICAL DECISION MAKING: Stable/uncomplicated ?  ?EVALUATION COMPLEXITY: Low ?  ?  ?GOALS: ?Goals reviewed with patient? Yes ?  ?  SHORT TERM GOALS: Target date: 09/13/2021 ?  ?Pt will report understanding and adherence to her HEP in order to promote independence in the management of her primary impairments. ?Baseline: HEP provided at eval ?Goal status: INITIAL ?  ?  ?  ?LONG TERM GOALS: Target date: 10/11/2021 ?  ?Pt will achieve a FOTO score of 64% in order to demonstrate improved functional ability as it relates to her primary impairments. ?Baseline: 47% ?Goal status: INITIAL ?  ?2.  Pt will achieve BIL global hip strength of 4+/5 or greater in order to return to hiking with less limitation. ?Baseline: See MMT chart ?Goal status: INITIAL ?  ?3.  Pt will report ability to walk >30 minutes with 0-2/10 pain in order to grocery shop without limitation. ?Baseline: >6/10 pain after 20 minutes of walking ?Goal status: INITIAL ?  ?4.  Pt will achieve Lt global hip AROM within 5 degrees of Rt in order to get dressed without limitation. ?Baseline: limited in all hip motions on Lt compared to Rt ?Goal status: INITIAL ?  ?5.  Pt will achieve WNL single-leg squats with 0-2/10 pain in order to return to Yoga with less limitation. ?Baseline:  ?Goal status: INITIAL ?  ?   ?PLAN: ?PT FREQUENCY: 2x/week ?  ?PT DURATION: 8 weeks ?  ?PLANNED INTERVENTIONS: Therapeutic exercises, Therapeutic activity, Neuromuscular re-education, Balance training, Gait training, Patient/Family education, J

## 2021-09-06 ENCOUNTER — Ambulatory Visit: Payer: 59

## 2021-09-11 ENCOUNTER — Ambulatory Visit: Payer: 59 | Attending: Physician Assistant

## 2021-09-11 DIAGNOSIS — M79671 Pain in right foot: Secondary | ICD-10-CM | POA: Diagnosis present

## 2021-09-11 DIAGNOSIS — M79672 Pain in left foot: Secondary | ICD-10-CM | POA: Insufficient documentation

## 2021-09-11 DIAGNOSIS — M6281 Muscle weakness (generalized): Secondary | ICD-10-CM | POA: Diagnosis present

## 2021-09-11 DIAGNOSIS — M25552 Pain in left hip: Secondary | ICD-10-CM | POA: Insufficient documentation

## 2021-09-11 DIAGNOSIS — R262 Difficulty in walking, not elsewhere classified: Secondary | ICD-10-CM | POA: Diagnosis present

## 2021-09-11 NOTE — Therapy (Signed)
?OUTPATIENT PHYSICAL THERAPY TREATMENT NOTE ? ? ?Patient Name: Katherine Cruz ?MRN: [id number] ?DOB:1965-11-25, 56 y.o., female ?Today's Date: 09/11/2021 ? ?PCP: Etta Grandchild, MD ?REFERRING PROVIDER: Williemae Natter, PA-C ? ?END OF SESSION:  ? PT End of Session - 09/11/21 1523   ? ? Visit Number 2   ? Number of Visits 17   ? Date for PT Re-Evaluation 10/11/21   ? Authorization Type [employer] dual complete   ? Authorization Time Period FOTO v6, v10, KX modifier v15   ? Progress Note Due on Visit 10   ? PT Start Time 1525   ? PT Stop Time 1610   ? PT Time Calculation (min) 45 min   ? Activity Tolerance Patient tolerated treatment well   ? Behavior During Therapy [facility] for tasks assessed/performed   ? ?  ?  ? ?  ? ? ?Past Medical History:  ?Diagnosis Date  ? Allergic rhinitis   ? Allergy   ? Brain fog   ? Breast mass 08/23/2010  ? Depression   ? Eating disorder   ? Emphysema of lung (HCC)   ? Fibrocystic breast   ? Fibromyalgia   ? Fibromyalgia   ? GERD (gastroesophageal reflux disease)   ? Heart murmur   ? Hyperlipidemia   ? MVP (mitral valve prolapse)   ? Osteoarthritis   ? Ovarian cyst   ? PAC (premature atrial contraction)   ? Positive PPD   ? PTSD (post-traumatic stress disorder)   ? PVC (premature ventricular contraction)   ? UTI (urinary tract infection)   ? UTI (urinary tract infection)   ? ?Past Surgical History:  ?Procedure Laterality Date  ? CERVICAL POLYPECTOMY    ? MAXILLARY SINUS LIFT    ? REPLANTATION THUMB    ? SINUS LIFT WITH BONE GRAFT    ? TUBAL LIGATION    ? ?Patient Active Problem List  ? Diagnosis Date Noted  ? Multiple gallstones 04/04/2021  ? Right upper quadrant abdominal tenderness without rebound tenderness 03/19/2021  ? Estrogen deficiency 12/18/2020  ? Colon cancer screening 12/18/2020  ? Visit for screening mammogram 12/18/2020  ? MVP (mitral valve prolapse) 12/18/2020  ? Other emphysema (HCC) 12/18/2020  ? GAD (generalized anxiety disorder) 12/18/2020  ? Claudication of lower extremity (HCC)  12/18/2020  ? Encounter for general adult medical examination with abnormal findings 12/18/2020  ? Need for shingles vaccine 12/15/2019  ? Mixed hyperlipidemia 12/03/2019  ? Onychomycosis 12/03/2019  ? Tremor of both hands 11/28/2019  ? Fibromyalgia 08/31/2015  ? Generalized osteoarthritis of multiple sites 01/03/2014  ? Atopic rhinitis 11/24/2009  ? ? ?REFERRING DIAG: chronic hip ? ?THERAPY DIAG:  ?Pain in left hip ? ?Muscle weakness (generalized) ? ?Difficulty in walking, not elsewhere classified ? ?Pain in right foot ? ?Pain in left foot ? ?PERTINENT HISTORY: Anxiety, depression, heart arrhythmias, OA ? ?PRECAUTIONS: None ? ?SUBJECTIVE: I've been super sedentary lately because every time I get active I'm in pain.  ? ?PAIN:  ?Are you having pain? Yes ?NPRS scale: 4/10 ?Pain location: Lt anterior hip ?Pain description: Lambert Mody ?Aggravating factors: walking >20 minutes, getting into her car, and walking after prolonged sitting ?Relieving factors: hip distraction and rest ? ? ? ?OBJECTIVE:  ?  ?DIAGNOSTIC FINDINGS: 04/20/2020: DG Hip unilat with or without pelvis 2-3 views Left: IMPRESSION: Negative ?  ?07/20/2021: DG pelvis: IMPRESSION: Lt>Rt hip impingement (From [facility] paperwork brought by patient) ?  ?PATIENT SURVEYS:  ?FOTO 47%, projected 64% in 13 visits ?  ?COGNITION: ?  Overall cognitive status: Within functional limits for tasks assessed               ?           ?SENSATION: ?WFL ?  ?MUSCLE LENGTH: ?Hamstrings: WNL BIL ?Thomas test: WNL ?  ?POSTURE:  ?BIL genu valgum with hip adduction ?  ?PALPATION: ?No TTP to superficial hip structure ?  ?PASSIVE ACCESSORIES: ?Lumbar CPAs unremarkable ?  ?LE ROM: ?  ?A/PROM Right ?08/16/2021 Left ?08/16/2021  ?Hip flexion 130/150 120/140p!  ?Hip abduction 50/65 40/60p!  ?Hip adduction 30/35 22/25p!  ?Hip internal rotation 30/40 28/35p!  ?Hip external rotation 30/50 29/40 mild pain  ? (Blank rows = not tested) ?  ?LE MMT: ?  ?MMT Right ?08/16/2021 Left ?08/16/2021  ?Hip  flexion 5/5 5/5  ?Hip extension 4/5 4/5  ?Hip abduction 4/5 3+/5  ?Hip internal rotation 5/5 5/5  ?Hip external rotation 5/5 4/5  ? (Blank rows = not tested) ?  ?LOWER EXTREMITY SPECIAL TESTS:  ?Hip scouring: (+) on Lt ?FABER: (+) on Lt ?Hip de-rotation test: (-) ?FADIR: (+) on Lt ?  ?FUNCTIONAL TESTS:  ?Squat: full-depth, mild BIL hip pain ?SL Squat: increased hip addution on stance leg BIL, 50% depth ?Lunge: adduction of front leg BIL, mild BIL hip pain ?  ?GAIT: ?Distance walked: 6920ft ?Assistive device utilized: None ?Level of assistance: Complete Independence ?Comments: BIL hip adduction with genu valgum ?  ?  ?  ?[facility]'S TREATMENT: ?[facility]                                                DATE: 09/11/2021 ?Therapeutic Exercise: ?Nustep level 5 x 5 mins while gathering subjective ?Leg press omega 30# 2x10 ?Leg press omega 15# 2x10 Lt only ?Standing hip abduction/extension 17.5# 2x10 BIL ?Lunge to blue side bosu 2x10 BIL ?Squats to chair x10 ?Bridge 2x10 ?Side plank with clamshell x15 BIL ?Modified thomas stretch EOM x1' BIL ? ? ?08/16/2021: ?Issued and demonstrated HEP ?  ?  ?PATIENT EDUCATION:  ?Education details: Pt educated on probable underlying pathophysiology behind her pain presentation, prognosis, POC, FOTO, and HEP ?Person educated: Patient ?Education method: Explanation, Demonstration, and Handouts ?Education comprehension: verbalized understanding and returned demonstration ?  ?  ?HOME EXERCISE PROGRAM: ?Access Code: [email or web address] ?URL: [email or web address] ?Date: 08/16/2021 ?Prepared by: Carmelina Daneucker [place] ?  ?Exercises ?- Squat with Chair Touch  - 1 x daily - 7 x weekly - 3 sets - 10 reps ?- Side Plank with Clam  - 1 x daily - 7 x weekly - 2 sets - 10 reps ?- Supine Bridge  - 1 x daily - 7 x weekly - 3 sets - 10 reps - 3-sec hold ?  ?ASSESSMENT: ?  ?CLINICAL IMPRESSION: ?Patient presents to PT with moderate levels of pain in her L hip and reports that she was initially  compliant with her HEP, but that she stopped due to increase in pain. We went back over her HEP and she was able to demonstrate each exercises within a pain free range that she feels confident she can do at home. Patient continues to benefit from skilled PT services and should be progressed as able to improve functional independence. ?  ?  ?OBJECTIVE IMPAIRMENTS Abnormal gait, decreased mobility, difficulty walking, decreased ROM, decreased strength, hypomobility, impaired flexibility, improper body mechanics, postural dysfunction, and pain.  ?  ?  ACTIVITY LIMITATIONS cleaning, community activity, laundry, yard work, and shopping.  ?  ?PERSONAL FACTORS Time since onset of injury/illness/exacerbation and 3+ comorbidities: See medical hx  are also affecting patient's functional outcome.  ?  ?  ?REHAB POTENTIAL: Good ?  ?CLINICAL DECISION MAKING: Stable/uncomplicated ?  ?EVALUATION COMPLEXITY: Low ?  ?  ?GOALS: ?Goals reviewed with patient? Yes ?  ?SHORT TERM GOALS: Target date: 09/13/2021 ?  ?Pt will report understanding and adherence to her HEP in order to promote independence in the management of her primary impairments. ?Baseline: HEP provided at eval ?Goal status: INITIAL ?  ?  ?  ?LONG TERM GOALS: Target date: 10/11/2021 ?  ?Pt will achieve a FOTO score of 64% in order to demonstrate improved functional ability as it relates to her primary impairments. ?Baseline: 47% ?Goal status: INITIAL ?  ?2.  Pt will achieve BIL global hip strength of 4+/5 or greater in order to return to hiking with less limitation. ?Baseline: See MMT chart ?Goal status: INITIAL ?  ?3.  Pt will report ability to walk >30 minutes with 0-2/10 pain in order to grocery shop without limitation. ?Baseline: >6/10 pain after 20 minutes of walking ?Goal status: INITIAL ?  ?4.  Pt will achieve Lt global hip AROM within 5 degrees of Rt in order to get dressed without limitation. ?Baseline: limited in all hip motions on Lt compared to Rt ?Goal status:  INITIAL ?  ?5.  Pt will achieve WNL single-leg squats with 0-2/10 pain in order to return to Yoga with less limitation. ?Baseline:  ?Goal status: INITIAL ?  ?  ?PLAN: ?PT FREQUENCY: 2x/week ?  ?PT DURATION: 8 weeks ?  ?PLANNED

## 2021-09-12 NOTE — Therapy (Signed)
?OUTPATIENT PHYSICAL THERAPY TREATMENT NOTE ? ? ?Patient Name: Katherine Cruz ?MRN: [id number] ?DOB:February 16, 1966, 56 y.o., female ?Today's Date: 09/13/2021 ? ?PCP: Janith Lima, MD ?REFERRING PROVIDER: Maryella Shivers, PA-C ? ?END OF SESSION:  ? PT End of Session - 09/13/21 1537   ? ? Visit Number 3   ? Number of Visits 17   ? Date for PT Re-Evaluation 10/11/21   ? Authorization Type UHC dual complete   ? Authorization Time Period FOTO v6, v10, KX modifier v15   ? Progress Note Due on Visit 10   ? PT Start Time 1537   ? PT Stop Time 1615   ? PT Time Calculation (min) 38 min   ? Activity Tolerance Patient tolerated treatment well   ? Behavior During Therapy [facility] for tasks assessed/performed   ? ?  ?  ? ?  ? ? ? ?Past Medical History:  ?Diagnosis Date  ? Allergic rhinitis   ? Allergy   ? Brain fog   ? Breast mass 08/23/2010  ? Depression   ? Eating disorder   ? Emphysema of lung ([place])   ? Fibrocystic breast   ? Fibromyalgia   ? Fibromyalgia   ? GERD (gastroesophageal reflux disease)   ? Heart murmur   ? Hyperlipidemia   ? MVP (mitral valve prolapse)   ? Osteoarthritis   ? Ovarian cyst   ? PAC (premature atrial contraction)   ? Positive PPD   ? PTSD (post-traumatic stress disorder)   ? PVC (premature ventricular contraction)   ? UTI (urinary tract infection)   ? UTI (urinary tract infection)   ? ?Past Surgical History:  ?Procedure Laterality Date  ? CERVICAL POLYPECTOMY    ? MAXILLARY SINUS LIFT    ? REPLANTATION THUMB    ? SINUS LIFT WITH BONE GRAFT    ? TUBAL LIGATION    ? ?Patient Active Problem List  ? Diagnosis Date Noted  ? Multiple gallstones 04/04/2021  ? Right upper quadrant abdominal tenderness without rebound tenderness 03/19/2021  ? Estrogen deficiency 12/18/2020  ? Colon cancer screening 12/18/2020  ? Visit for screening mammogram 12/18/2020  ? MVP (mitral valve prolapse) 12/18/2020  ? Other emphysema ([place]) 12/18/2020  ? GAD (generalized anxiety disorder) 12/18/2020  ? Claudication of lower extremity ([place])  12/18/2020  ? Encounter for general adult medical examination with abnormal findings 12/18/2020  ? Need for shingles vaccine 12/15/2019  ? Mixed hyperlipidemia 12/03/2019  ? Onychomycosis 12/03/2019  ? Tremor of both hands 11/28/2019  ? Fibromyalgia 08/31/2015  ? Generalized osteoarthritis of multiple sites 01/03/2014  ? Atopic rhinitis 11/24/2009  ? ? ?REFERRING DIAG: chronic hip ? ?THERAPY DIAG:  ?Pain in left hip ? ?Muscle weakness (generalized) ? ?Difficulty in walking, not elsewhere classified ? ?Pain in right foot ? ?Pain in left foot ? ?PERTINENT HISTORY: Anxiety, depression, heart arrhythmias, OA ? ?PRECAUTIONS: None ? ?SUBJECTIVE: Pt reports that yesterday, she was limping with sharp anterior hip pain, which is her concordant pain after any exercise per patient. She reports non-adherence to her HEP due to fear of increased pain following exercise. ? ?PAIN:  ?Are you having pain? Yes ?NPRS scale: 0/10 ?Pain location: Lt anterior hip ?Pain description: Hervey Ard ?Aggravating factors: walking >20 minutes, getting into her car, and walking after prolonged sitting ?Relieving factors: hip distraction and rest ? ? ? ?OBJECTIVE:  ?  ?DIAGNOSTIC FINDINGS: 04/20/2020: DG Hip unilat with or without pelvis 2-3 views Left: IMPRESSION: Negative ?  ?07/20/2021: DG pelvis: IMPRESSION: Lt>Rt hip impingement (  From [facility] paperwork brought by patient) ?  ?PATIENT SURVEYS:  ?FOTO 47%, projected 64% in 13 visits ?  ?COGNITION: ?          Overall cognitive status: Within functional limits for tasks assessed               ?           ?SENSATION: ?WFL ?  ?MUSCLE LENGTH: ?Hamstrings: WNL BIL ?Thomas test: WNL ?  ?POSTURE:  ?BIL genu valgum with hip adduction ?  ?PALPATION: ?No TTP to superficial hip structure ?  ?PASSIVE ACCESSORIES: ?Lumbar CPAs unremarkable ?  ?LE ROM: ?  ?A/PROM Right ?08/16/2021 Left ?08/16/2021  ?Hip flexion 130/150 120/140p!  ?Hip abduction 50/65 40/60p!  ?Hip adduction 30/35 22/25p!  ?Hip internal rotation 30/40  28/35p!  ?Hip external rotation 30/50 29/40 mild pain  ? (Blank rows = not tested) ?  ?LE MMT: ?  ?MMT Right ?08/16/2021 Left ?08/16/2021  ?Hip flexion 5/5 5/5  ?Hip extension 4/5 4/5  ?Hip abduction 4/5 3+/5  ?Hip internal rotation 5/5 5/5  ?Hip external rotation 5/5 4/5  ? (Blank rows = not tested) ?  ?LOWER EXTREMITY SPECIAL TESTS:  ?Hip scouring: (+) on Lt ?FABER: (+) on Lt ?Hip de-rotation test: (-) ?FADIR: (+) on Lt ?  ?FUNCTIONAL TESTS:  ?Squat: full-depth, mild BIL hip pain ?SL Squat: increased hip addution on stance leg BIL, 50% depth ?Lunge: adduction of front leg BIL, mild BIL hip pain ?  ?GAIT: ?Distance walked: 35ft ?Assistive device utilized: None ?Level of assistance: Complete Independence ?Comments: BIL hip adduction with genu valgum ?  ?  ?  ?TODAY'S TREATMENT: ? ?[facility] Adult PT Treatment:                                                DATE: 09/13/2021 ?Therapeutic Exercise: ?Lt kickstand stance on Airex pad with 3# cable Pallof press 2x8 BIL ?Self-hip distraction on Lt with forward lunge and sheet around upper thigh x5 ?Mini-squat side steps with 7# waist attachment x5 walk-outs BIL ?Supine 90/90 abdominal isometric with handhold resistance 4x30sec ?Seated butterfly stretch x54min ?Manual Therapy: ?Grade 3 lateral Lt hip distraction with mobilization belt x8 minutes ?Neuromuscular re-ed: ?N/A ?Therapeutic Activity: ?N/A ?Modalities: ?N/A ?Self Care: ?N/A ? ? ?[facility] Adult PT Treatment:                                                DATE: 09/11/2021 ?Therapeutic Exercise: ?Nustep level 5 x 5 mins while gathering subjective ?Leg press omega 30# 2x10 ?Leg press omega 15# 2x10 Lt only ?Standing hip abduction/extension 17.5# 2x10 BIL ?Lunge to blue side bosu 2x10 BIL ?Squats to chair x10 ?Bridge 2x10 ?Side plank with clamshell x15 BIL ?Modified thomas stretch EOM x1' BIL ? ? ?08/16/2021: ?Issued and demonstrated HEP ?  ?  ?PATIENT EDUCATION:  ?Education details: Pt educated on probable underlying pathophysiology  behind her pain presentation, prognosis, POC, FOTO, and HEP ?Person educated: Patient ?Education method: Explanation, Demonstration, and Handouts ?Education comprehension: verbalized understanding and returned demonstration ?  ?  ?HOME EXERCISE PROGRAM: ?Access Code: [email or web address] ?URL: [email or web address] ?Date: 08/16/2021 ?Prepared by: Vanessa Jasonville ?  ?Exercises ?- Squat with Chair Touch  - 1 x daily - 7  x weekly - 3 sets - 10 reps ?- Side Plank with Clam  - 1 x daily - 7 x weekly - 2 sets - 10 reps ?- Supine Bridge  - 1 x daily - 7 x weekly - 3 sets - 10 reps - 3-sec hold ?  ?ASSESSMENT: ?  ?CLINICAL IMPRESSION: ?Pt responded well to all interventions today, demonstrating good form and no pain throughout the session. Exercises were comprised primarily of low-level, low-impact hip strengthening due to increased pain after last session. Additionally, the pt reports decreased pain  ?  ?  ?OBJECTIVE IMPAIRMENTS Abnormal gait, decreased mobility, difficulty walking, decreased ROM, decreased strength, hypomobility, impaired flexibility, improper body mechanics, postural dysfunction, and pain.  ?  ?ACTIVITY LIMITATIONS cleaning, community activity, laundry, yard work, and shopping.  ?  ?PERSONAL FACTORS Time since onset of injury/illness/exacerbation and 3+ comorbidities: See medical hx  are also affecting patient's functional outcome.  ?  ?  ?REHAB POTENTIAL: Good ?  ?CLINICAL DECISION MAKING: Stable/uncomplicated ?  ?EVALUATION COMPLEXITY: Low ?  ?  ?GOALS: ?Goals reviewed with patient? Yes ?  ?SHORT TERM GOALS: Target date: 09/13/2021 ?  ?Pt will report understanding and adherence to her HEP in order to promote independence in the management of her primary impairments. ?Baseline: HEP provided at eval ?Goal status: INITIAL ?  ?  ?  ?LONG TERM GOALS: Target date: 10/11/2021 ?  ?Pt will achieve a FOTO score of 64% in order to demonstrate improved functional ability as it relates to her primary  impairments. ?Baseline: 47% ?Goal status: INITIAL ?  ?2.  Pt will achieve BIL global hip strength of 4+/5 or greater in order to return to hiking with less limitation. ?Baseline: See MMT chart ?Goal status: INITIAL ?  ?3.

## 2021-09-13 ENCOUNTER — Ambulatory Visit: Payer: 59

## 2021-09-13 DIAGNOSIS — M25552 Pain in left hip: Secondary | ICD-10-CM

## 2021-09-13 DIAGNOSIS — M79671 Pain in right foot: Secondary | ICD-10-CM

## 2021-09-13 DIAGNOSIS — M79672 Pain in left foot: Secondary | ICD-10-CM

## 2021-09-13 DIAGNOSIS — R262 Difficulty in walking, not elsewhere classified: Secondary | ICD-10-CM

## 2021-09-13 DIAGNOSIS — M6281 Muscle weakness (generalized): Secondary | ICD-10-CM

## 2021-09-18 ENCOUNTER — Ambulatory Visit: Payer: 59

## 2021-09-20 ENCOUNTER — Ambulatory Visit: Payer: 59

## 2021-09-25 ENCOUNTER — Ambulatory Visit: Payer: 59 | Attending: Physician Assistant

## 2021-09-25 DIAGNOSIS — M79671 Pain in right foot: Secondary | ICD-10-CM | POA: Insufficient documentation

## 2021-09-25 DIAGNOSIS — M79672 Pain in left foot: Secondary | ICD-10-CM | POA: Diagnosis present

## 2021-09-25 DIAGNOSIS — R262 Difficulty in walking, not elsewhere classified: Secondary | ICD-10-CM | POA: Insufficient documentation

## 2021-09-25 DIAGNOSIS — M6281 Muscle weakness (generalized): Secondary | ICD-10-CM | POA: Insufficient documentation

## 2021-09-25 DIAGNOSIS — M25552 Pain in left hip: Secondary | ICD-10-CM | POA: Diagnosis present

## 2021-09-25 NOTE — Therapy (Signed)
?OUTPATIENT PHYSICAL THERAPY TREATMENT NOTE ? ? ?Patient Name: Katherine Cruz ?MRN: [id number] ?DOB:Feb 21, 1966, 56 y.o., female ?Today's Date: 09/25/2021 ? ?PCP: Etta Grandchild, MD ?REFERRING PROVIDER: Williemae Natter, PA-C ? ?END OF SESSION:  ? PT End of Session - 09/25/21 1526   ? ? Visit Number 4   ? Number of Visits 17   ? Date for PT Re-Evaluation 10/11/21   ? Authorization Type UHC dual complete   ? Authorization Time Period FOTO v6, v10, KX modifier v15   ? Progress Note Due on Visit 10   ? PT Start Time 1528   ? PT Stop Time 1610   ? PT Time Calculation (min) 42 min   ? Activity Tolerance Patient tolerated treatment well   ? Behavior During Therapy [facility] for tasks assessed/performed   ? ?  ?  ? ?  ? ? ? ? ?Past Medical History:  ?Diagnosis Date  ? Allergic rhinitis   ? Allergy   ? Brain fog   ? Breast mass 08/23/2010  ? Depression   ? Eating disorder   ? Emphysema of lung ([facility])   ? Fibrocystic breast   ? Fibromyalgia   ? Fibromyalgia   ? GERD (gastroesophageal reflux disease)   ? Heart murmur   ? Hyperlipidemia   ? MVP (mitral valve prolapse)   ? Osteoarthritis   ? Ovarian cyst   ? PAC (premature atrial contraction)   ? Positive PPD   ? PTSD (post-traumatic stress disorder)   ? PVC (premature ventricular contraction)   ? UTI (urinary tract infection)   ? UTI (urinary tract infection)   ? ?Past Surgical History:  ?Procedure Laterality Date  ? CERVICAL POLYPECTOMY    ? MAXILLARY SINUS LIFT    ? REPLANTATION THUMB    ? SINUS LIFT WITH BONE GRAFT    ? TUBAL LIGATION    ? ?Patient Active Problem List  ? Diagnosis Date Noted  ? Multiple gallstones 04/04/2021  ? Right upper quadrant abdominal tenderness without rebound tenderness 03/19/2021  ? Estrogen deficiency 12/18/2020  ? Colon cancer screening 12/18/2020  ? Visit for screening mammogram 12/18/2020  ? MVP (mitral valve prolapse) 12/18/2020  ? Other emphysema ([facility]) 12/18/2020  ? GAD (generalized anxiety disorder) 12/18/2020  ? Claudication of lower extremity ([facility])  12/18/2020  ? Encounter for general adult medical examination with abnormal findings 12/18/2020  ? Need for shingles vaccine 12/15/2019  ? Mixed hyperlipidemia 12/03/2019  ? Onychomycosis 12/03/2019  ? Tremor of both hands 11/28/2019  ? Fibromyalgia 08/31/2015  ? Generalized osteoarthritis of multiple sites 01/03/2014  ? Atopic rhinitis 11/24/2009  ? ? ?REFERRING DIAG: chronic hip ? ?THERAPY DIAG:  ?Pain in left hip ? ?Muscle weakness (generalized) ? ?Difficulty in walking, not elsewhere classified ? ?Pain in right foot ? ?Pain in left foot ? ?PERTINENT HISTORY: Anxiety, depression, heart arrhythmias, OA ? ?PRECAUTIONS: None ? ?SUBJECTIVE: Pt reports that she had a positive response to hip distraction at her last session. However, she states this only lasted 2-3 days until her pain started again, causing her to limp when she walks. She reports doing her HEP about 3 days per week. ? ?PAIN:  ?Are you having pain? Yes ?NPRS scale: 0/10 ?Pain location: Lt anterior hip ?Pain description: Lambert Mody ?Aggravating factors: walking >20 minutes, getting into her car, and walking after prolonged sitting ?Relieving factors: hip distraction and rest ? ? ? ?OBJECTIVE:  ? *Unless otherwise noted, objective information collected previously* ? ?DIAGNOSTIC FINDINGS: 04/20/2020: DG Hip unilat with  or without pelvis 2-3 views Left: IMPRESSION: Negative ?  ?07/20/2021: DG pelvis: IMPRESSION: Lt>Rt hip impingement (From [facility] paperwork brought by patient) ?  ?PATIENT SURVEYS:  ?FOTO 47%, projected 64% in 13 visits ?  ?COGNITION: ?          Overall cognitive status: Within functional limits for tasks assessed               ?           ?SENSATION: ?WFL ?  ?MUSCLE LENGTH: ?Hamstrings: WNL BIL ?Thomas test: WNL ?  ?POSTURE:  ?BIL genu valgum with hip adduction ?  ?PALPATION: ?No TTP to superficial hip structure ?  ?PASSIVE ACCESSORIES: ?Lumbar CPAs unremarkable ?  ?LE ROM: ?  ?A/PROM Right ?08/16/2021 Left ?08/16/2021  ?Hip flexion 130/150 120/140p!   ?Hip abduction 50/65 40/60p!  ?Hip adduction 30/35 22/25p!  ?Hip internal rotation 30/40 28/35p!  ?Hip external rotation 30/50 29/40 mild pain  ? (Blank rows = not tested) ?  ?LE MMT: ?  ?MMT Right ?08/16/2021 Left ?08/16/2021  ?Hip flexion 5/5 5/5  ?Hip extension 4/5 4/5  ?Hip abduction 4/5 3+/5  ?Hip internal rotation 5/5 5/5  ?Hip external rotation 5/5 4/5  ? (Blank rows = not tested) ?  ?LOWER EXTREMITY SPECIAL TESTS:  ?Hip scouring: (+) on Lt ?FABER: (+) on Lt ?Hip de-rotation test: (-) ?FADIR: (+) on Lt ?  ?FUNCTIONAL TESTS:  ?Squat: full-depth, mild BIL hip pain ?SL Squat: increased hip addution on stance leg BIL, 50% depth ?Lunge: adduction of front leg BIL, mild BIL hip pain ?  ?GAIT: ?Distance walked: 3720ft ?Assistive device utilized: None ?Level of assistance: Complete Independence ?Comments: BIL hip adduction with genu valgum ?  ?  ?  ?TODAY'S TREATMENT: ? ?[facility] Adult PT Treatment:                                                DATE: 09/25/2021 ?Therapeutic Exercise: ?Forward lunge to 8-inch step 2x10 ?Standing hip extension with 10# cable 2x10 BIL ?Standing hamstring curl with 10# cable 2x10 BIL ?Standing hip abduction with 10# cable 2x10 BIL ?Squat into overhead reach and heel raise with two 13# cables to waist attachment at Free Motion machine 2x10 ?Dead lift with 45# hex bar 2x8 ?Standing IT band stretch x421min BIL ?Manual Therapy: ?Grade 3 lateral Lt hip distraction with mobilization belt x8 minutes ?Neuromuscular re-ed: ?N/A ?Therapeutic Activity: ?N/A ?Modalities: ?N/A ?Self Care: ?N/A ? ? ?[facility] Adult PT Treatment:                                                DATE: 09/13/2021 ?Therapeutic Exercise: ?Lt kickstand stance on Airex pad with 3# cable Pallof press 2x8 BIL ?Self-hip distraction on Lt with forward lunge and sheet around upper thigh x5 ?Mini-squat side steps with 7# waist attachment x5 walk-outs BIL ?Supine 90/90 abdominal isometric with handhold resistance 4x30sec ?Seated butterfly stretch  x132min ?Manual Therapy: ?Grade 3 lateral Lt hip distraction with mobilization belt x8 minutes ?Neuromuscular re-ed: ?N/A ?Therapeutic Activity: ?N/A ?Modalities: ?N/A ?Self Care: ?N/A ? ? ?[facility] Adult PT Treatment:  DATE: 09/11/2021 ?Therapeutic Exercise: ?Nustep level 5 x 5 mins while gathering subjective ?Leg press omega 30# 2x10 ?Leg press omega 15# 2x10 Lt only ?Standing hip abduction/extension 17.5# 2x10 BIL ?Lunge to blue side bosu 2x10 BIL ?Squats to chair x10 ?Bridge 2x10 ?Side plank with clamshell x15 BIL ?Modified thomas stretch EOM x1' BIL ? ? ?  ?  ?PATIENT EDUCATION:  ?Education details: Pt educated on probable underlying pathophysiology behind her pain presentation, prognosis, POC, FOTO, and HEP ?Person educated: Patient ?Education method: Explanation, Demonstration, and Handouts ?Education comprehension: verbalized understanding and returned demonstration ?  ?  ?HOME EXERCISE PROGRAM: ?Access Code: [email or web address] ?URL: [email or web address] ?Date: 08/16/2021 ?Prepared by: Carmelina Dane ?  ?Exercises ?- Squat with Chair Touch  - 1 x daily - 7 x weekly - 3 sets - 10 reps ?- Side Plank with Clam  - 1 x daily - 7 x weekly - 2 sets - 10 reps ?- Supine Bridge  - 1 x daily - 7 x weekly - 3 sets - 10 reps - 3-sec hold ?  ?ASSESSMENT: ?  ?CLINICAL IMPRESSION: ?Pt responded well to all interventions today, demonstrating good form and no pain with performed exercises. She again reports a therapeutic response to hip distraction, stating it fees like her hip is "flying." She will continue to benefit from skilled PT to address her primary impairments and return to her prior level of function with less limitation.  ?  ?  ?OBJECTIVE IMPAIRMENTS Abnormal gait, decreased mobility, difficulty walking, decreased ROM, decreased strength, hypomobility, impaired flexibility, improper body mechanics, postural dysfunction, and pain.  ?  ?ACTIVITY LIMITATIONS cleaning,  community activity, laundry, yard work, and shopping.  ?  ?PERSONAL FACTORS Time since onset of injury/illness/exacerbation and 3+ comorbidities: See medical hx  are also affecting patient's functional outcome.

## 2021-09-26 NOTE — Therapy (Incomplete)
?OUTPATIENT PHYSICAL THERAPY TREATMENT NOTE ? ? ?Patient Name: Katherine Cruz ?MRN: [id number] ?DOB:1965-11-19, 56 y.o., female ?Today's Date: 09/26/2021 ? ?PCP: Janith Lima, MD ?REFERRING PROVIDER: Maryella Shivers, PA-C ? ?END OF SESSION:  ? ? ? ? ? ?Past Medical History:  ?Diagnosis Date  ? Allergic rhinitis   ? Allergy   ? Brain fog   ? Breast mass 08/23/2010  ? Depression   ? Eating disorder   ? Emphysema of lung ([place])   ? Fibrocystic breast   ? Fibromyalgia   ? Fibromyalgia   ? GERD (gastroesophageal reflux disease)   ? Heart murmur   ? Hyperlipidemia   ? MVP (mitral valve prolapse)   ? Osteoarthritis   ? Ovarian cyst   ? PAC (premature atrial contraction)   ? Positive PPD   ? PTSD (post-traumatic stress disorder)   ? PVC (premature ventricular contraction)   ? UTI (urinary tract infection)   ? UTI (urinary tract infection)   ? ?Past Surgical History:  ?Procedure Laterality Date  ? CERVICAL POLYPECTOMY    ? MAXILLARY SINUS LIFT    ? REPLANTATION THUMB    ? SINUS LIFT WITH BONE GRAFT    ? TUBAL LIGATION    ? ?Patient Active Problem List  ? Diagnosis Date Noted  ? Multiple gallstones 04/04/2021  ? Right upper quadrant abdominal tenderness without rebound tenderness 03/19/2021  ? Estrogen deficiency 12/18/2020  ? Colon cancer screening 12/18/2020  ? Visit for screening mammogram 12/18/2020  ? MVP (mitral valve prolapse) 12/18/2020  ? Other emphysema ([place]) 12/18/2020  ? GAD (generalized anxiety disorder) 12/18/2020  ? Claudication of lower extremity ([place]) 12/18/2020  ? Encounter for general adult medical examination with abnormal findings 12/18/2020  ? Need for shingles vaccine 12/15/2019  ? Mixed hyperlipidemia 12/03/2019  ? Onychomycosis 12/03/2019  ? Tremor of both hands 11/28/2019  ? Fibromyalgia 08/31/2015  ? Generalized osteoarthritis of multiple sites 01/03/2014  ? Atopic rhinitis 11/24/2009  ? ? ?REFERRING DIAG: chronic hip ? ?THERAPY DIAG:  ?No diagnosis found. ? ?PERTINENT HISTORY: Anxiety, depression, heart  arrhythmias, OA ? ?PRECAUTIONS: None ? ?SUBJECTIVE: *** ? ?PAIN:  ?Are you having pain? Yes ?NPRS scale: 0/10 ?Pain location: Lt anterior hip ?Pain description: [place] ?Aggravating factors: walking >20 minutes, getting into her car, and walking after prolonged sitting ?Relieving factors: hip distraction and rest ? ? ? ?OBJECTIVE:  ? *Unless otherwise noted, objective information collected previously* ? ?DIAGNOSTIC FINDINGS: 04/20/2020: DG Hip unilat with or without pelvis 2-3 views Left: IMPRESSION: Negative ?  ?07/20/2021: DG pelvis: IMPRESSION: Lt>Rt hip impingement (From [facility] paperwork brought by patient) ?  ?PATIENT SURVEYS:  ?FOTO 47%, projected 64% in 13 visits ?  ?COGNITION: ?          Overall cognitive status: Within functional limits for tasks assessed               ?           ?SENSATION: ?WFL ?  ?MUSCLE LENGTH: ?Hamstrings: WNL BIL ?Thomas test: WNL ?  ?POSTURE:  ?BIL genu valgum with hip adduction ?  ?PALPATION: ?No TTP to superficial hip structure ?  ?PASSIVE ACCESSORIES: ?Lumbar CPAs unremarkable ?  ?LE ROM: ?  ?A/PROM Right ?08/16/2021 Left ?08/16/2021  ?Hip flexion 130/150 120/140p!  ?Hip abduction 50/65 40/60p!  ?Hip adduction 30/35 22/25p!  ?Hip internal rotation 30/40 28/35p!  ?Hip external rotation 30/50 29/40 mild pain  ? (Blank rows = not tested) ?  ?LE MMT: ?  ?MMT Right ?08/16/2021  Left ?08/16/2021  ?Hip flexion 5/5 5/5  ?Hip extension 4/5 4/5  ?Hip abduction 4/5 3+/5  ?Hip internal rotation 5/5 5/5  ?Hip external rotation 5/5 4/5  ? (Blank rows = not tested) ?  ?LOWER EXTREMITY SPECIAL TESTS:  ?Hip scouring: (+) on Lt ?FABER: (+) on Lt ?Hip de-rotation test: (-) ?FADIR: (+) on Lt ?  ?FUNCTIONAL TESTS:  ?Squat: full-depth, mild BIL hip pain ?SL Squat: increased hip addution on stance leg BIL, 50% depth ?Lunge: adduction of front leg BIL, mild BIL hip pain ?  ?GAIT: ?Distance walked: 69ft ?Assistive device utilized: None ?Level of assistance: Complete Independence ?Comments: BIL hip adduction with  genu valgum ?  ?  ?  ?TODAY'S TREATMENT: ? ?[facility] Adult PT Treatment:                                                DATE: 09/27/2021 ?Therapeutic Exercise: ?*** ?Manual Therapy: ?*** ?Neuromuscular re-ed: ?*** ?Therapeutic Activity: ?*** ?Modalities: ?*** ?Self Care: ?*** ? ? ?[facility] Adult PT Treatment:                                                DATE: 09/25/2021 ?Therapeutic Exercise: ?Forward lunge to 8-inch step 2x10 ?Standing hip extension with 10# cable 2x10 BIL ?Standing hamstring curl with 10# cable 2x10 BIL ?Standing hip abduction with 10# cable 2x10 BIL ?Squat into overhead reach and heel raise with two 13# cables to waist attachment at Free Motion machine 2x10 ?Dead lift with 45# hex bar 2x8 ?Standing IT band stretch x32min BIL ?Manual Therapy: ?Grade 3 lateral Lt hip distraction with mobilization belt x8 minutes ?Neuromuscular re-ed: ?N/A ?Therapeutic Activity: ?N/A ?Modalities: ?N/A ?Self Care: ?N/A ? ? ?[facility] Adult PT Treatment:                                                DATE: 09/13/2021 ?Therapeutic Exercise: ?Lt kickstand stance on Airex pad with 3# cable Pallof press 2x8 BIL ?Self-hip distraction on Lt with forward lunge and sheet around upper thigh x5 ?Mini-squat side steps with 7# waist attachment x5 walk-outs BIL ?Supine 90/90 abdominal isometric with handhold resistance 4x30sec ?Seated butterfly stretch x23min ?Manual Therapy: ?Grade 3 lateral Lt hip distraction with mobilization belt x8 minutes ?Neuromuscular re-ed: ?N/A ?Therapeutic Activity: ?N/A ?Modalities: ?N/A ?Self Care: ?N/A ? ? ?  ?  ?PATIENT EDUCATION:  ?Education details: Pt educated on probable underlying pathophysiology behind her pain presentation, prognosis, POC, FOTO, and HEP ?Person educated: Patient ?Education method: Explanation, Demonstration, and Handouts ?Education comprehension: verbalized understanding and returned demonstration ?  ?  ?HOME EXERCISE PROGRAM: ?Access Code: [email or web address] ?URL:  [email or web address] ?Date: 08/16/2021 ?Prepared by: Vanessa Shannon ?  ?Exercises ?- Squat with Chair Touch  - 1 x daily - 7 x weekly - 3 sets - 10 reps ?- Side Plank with Clam  - 1 x daily - 7 x weekly - 2 sets - 10 reps ?- Supine Bridge  - 1 x daily - 7 x weekly - 3 sets - 10 reps - 3-sec hold ?  ?ASSESSMENT: ?  ?CLINICAL IMPRESSION: ?*** ?  ?  ?  OBJECTIVE IMPAIRMENTS Abnormal gait, decreased mobility, difficulty walking, decreased ROM, decreased strength, hypomobility, impaired flexibility, improper body mechanics, postural dysfunction, and pain.  ?  ?ACTIVITY LIMITATIONS cleaning, community activity, laundry, yard work, and shopping.  ?  ?PERSONAL FACTORS Time since onset of injury/illness/exacerbation and 3+ comorbidities: See medical hx  are also affecting patient's functional outcome.  ?  ?  ?  ?  ?GOALS: ?Goals reviewed with patient? Yes ?  ?SHORT TERM GOALS: Target date: 09/13/2021 ?  ?Pt will report understanding and adherence to her HEP in order to promote independence in the management of her primary impairments. ?Baseline: HEP provided at eval ?Goal status: INITIAL ?  ?  ?  ?LONG TERM GOALS: Target date: 10/11/2021 ?  ?Pt will achieve a FOTO score of 64% in order to demonstrate improved functional ability as it relates to her primary impairments. ?Baseline: 47% ?Goal status: INITIAL ?  ?2.  Pt will achieve BIL global hip strength of 4+/5 or greater in order to return to hiking with less limitation. ?Baseline: See MMT chart ?Goal status: INITIAL ?  ?3.  Pt will report ability to walk >30 minutes with 0-2/10 pain in order to grocery shop without limitation. ?Baseline: >6/10 pain after 20 minutes of walking ?Goal status: INITIAL ?  ?4.  Pt will achieve Lt global hip AROM within 5 degrees of Rt in order to get dressed without limitation. ?Baseline: limited in all hip motions on Lt compared to Rt ?Goal status: INITIAL ?  ?5.  Pt will achieve WNL single-leg squats with 0-2/10 pain in order to  return to Yoga with less limitation. ?Baseline:  ?Goal status: INITIAL ?  ?  ?PLAN: ?PT FREQUENCY: 2x/week ?  ?PT DURATION: 8 weeks ?  ?PLANNED INTERVENTIONS: Therapeutic exercises, Therapeutic activity, Neuromuscular re-education, Balance

## 2021-09-27 ENCOUNTER — Ambulatory Visit: Payer: 59

## 2021-09-27 ENCOUNTER — Telehealth: Payer: Self-pay

## 2021-09-27 NOTE — Telephone Encounter (Signed)
Returned patient's phone call regarding her continued pain with activity. Discussed POC moving forward. Instructed pt to perform stretches in HEP and to monitor symptoms until her Tuesday appointment. ?

## 2021-10-02 ENCOUNTER — Ambulatory Visit: Payer: 59

## 2021-10-04 ENCOUNTER — Ambulatory Visit: Payer: 59

## 2021-10-09 ENCOUNTER — Ambulatory Visit: Payer: 59

## 2021-10-26 ENCOUNTER — Ambulatory Visit: Payer: 59 | Admitting: Student

## 2021-10-30 ENCOUNTER — Ambulatory Visit: Payer: 59 | Attending: Physician Assistant

## 2021-10-30 DIAGNOSIS — M6281 Muscle weakness (generalized): Secondary | ICD-10-CM | POA: Diagnosis present

## 2021-10-30 DIAGNOSIS — R262 Difficulty in walking, not elsewhere classified: Secondary | ICD-10-CM | POA: Insufficient documentation

## 2021-10-30 DIAGNOSIS — M25552 Pain in left hip: Secondary | ICD-10-CM | POA: Diagnosis present

## 2021-10-30 NOTE — Therapy (Addendum)
OUTPATIENT PHYSICAL THERAPY TREATMENT NOTE/ RE-EVALUATION/ DISCHARGE SUMMARY   Patient Name: Katherine Cruz MRN: [id number] DOB:1966-01-14, 56 y.o., female Today's Date: 10/30/2021  PCP: Janith Lima, MD REFERRING PROVIDER: Maryella Shivers, PA-C  END OF SESSION:   PT End of Session - 10/30/21 1001     Visit Number 5    Number of Visits 11    Date for PT Re-Evaluation 12/18/21    Authorization Type [employer] dual complete    Authorization Time Period FOTO v6, v10, KX modifier v15    Progress Note Due on Visit 10    PT Start Time 1000    PT Stop Time 1040    PT Time Calculation (min) 40 min    Activity Tolerance Patient tolerated treatment well    Behavior During Therapy WFL for tasks assessed/performed                Past Medical History:  Diagnosis Date   Allergic rhinitis    Allergy    Brain fog    Breast mass 08/23/2010   Depression    Eating disorder    Emphysema of lung ([place])    Fibrocystic breast    Fibromyalgia    Fibromyalgia    GERD (gastroesophageal reflux disease)    Heart murmur    Hyperlipidemia    MVP (mitral valve prolapse)    Osteoarthritis    Ovarian cyst    PAC (premature atrial contraction)    Positive PPD    PTSD (post-traumatic stress disorder)    PVC (premature ventricular contraction)    UTI (urinary tract infection)    UTI (urinary tract infection)    Past Surgical History:  Procedure Laterality Date   CERVICAL POLYPECTOMY     MAXILLARY SINUS LIFT     REPLANTATION THUMB     SINUS LIFT WITH BONE GRAFT     TUBAL LIGATION     Patient Active Problem List   Diagnosis Date Noted   Multiple gallstones 04/04/2021   Right upper quadrant abdominal tenderness without rebound tenderness 03/19/2021   Estrogen deficiency 12/18/2020   Colon cancer screening 12/18/2020   Visit for screening mammogram 12/18/2020   MVP (mitral valve prolapse) 12/18/2020   Other emphysema ([place]) 12/18/2020   GAD (generalized anxiety disorder) 12/18/2020    Claudication of lower extremity ([place]) 12/18/2020   Encounter for general adult medical examination with abnormal findings 12/18/2020   Need for shingles vaccine 12/15/2019   Mixed hyperlipidemia 12/03/2019   Onychomycosis 12/03/2019   Tremor of both hands 11/28/2019   Fibromyalgia 08/31/2015   Generalized osteoarthritis of multiple sites 01/03/2014   Atopic rhinitis 11/24/2009    REFERRING DIAG: chronic hip  THERAPY DIAG:  Pain in left hip - Plan: PT plan of care cert/re-cert  Muscle weakness (generalized) - Plan: PT plan of care cert/re-cert  Difficulty in walking, not elsewhere classified - Plan: PT plan of care cert/re-cert  PERTINENT HISTORY: Anxiety, depression, heart arrhythmias, OA  PRECAUTIONS: None  SUBJECTIVE: Pt reports to therapy after a month of not attending visits. She reports that her pain has been continual, although not as bad as it has been in the past. She reports non-adherence to her HEP due to decreased stamina and fibromyalgia.   PAIN:  Are you having pain? Yes NPRS scale: 4/10 Lt hip pain, 7/10 full body pain due to fibromyalgia Pain description: [place] Aggravating factors: walking >20 minutes, getting into her car, and walking after prolonged sitting Relieving factors: hip distraction and rest  OBJECTIVE:   *Unless otherwise noted, objective information collected previously*  DIAGNOSTIC FINDINGS: 04/20/2020: DG Hip unilat with or without pelvis 2-3 views Left: IMPRESSION: Negative   07/20/2021: DG pelvis: IMPRESSION: Lt>Rt hip impingement (From [facility] paperwork brought by patient)   PATIENT SURVEYS:  FOTO 47%, projected 64% in 13 visits   COGNITION:           Overall cognitive status: Within functional limits for tasks assessed                          SENSATION: WFL   MUSCLE LENGTH: Hamstrings: WNL BIL Thomas test: WNL   POSTURE:  BIL genu valgum with hip adduction   PALPATION: No TTP to superficial hip structure   PASSIVE  ACCESSORIES: Lumbar CPAs unremarkable   LE ROM:   A/PROM Right 08/16/2021 Left 08/16/2021 Left 10/30/2021  Hip flexion 130/150 120/140p! 115/140p!  Hip abduction 50/65 40/60p! 30/60p!  Hip adduction 30/35 22/25p! 22/25p!  Hip internal rotation 30/40 28/35p! 33/35p!  Hip external rotation 30/50 29/40 mild pain 40/45   (Blank rows = not tested)   LE MMT:   MMT Right 08/16/2021 Left 08/16/2021 Right 10/30/2021 Left 10/30/2021  Hip flexion 5/5 5/5    Hip extension 4/5 4/5 5/5 5/5  Hip abduction 4/5 3+/5 5/5 4+/5  Hip internal rotation 5/5 5/5  5/5  Hip external rotation 5/5 4/5  4+/5   (Blank rows = not tested)   LOWER EXTREMITY SPECIAL TESTS:  Hip scouring: (+) on Lt FABER: (+) on Lt Hip de-rotation test: (-) FADIR: (+) on Lt   FUNCTIONAL TESTS:  Squat: full-depth, mild BIL hip pain SL Squat: increased hip addution on stance leg BIL, 50% depth Lunge: adduction of front leg BIL, mild BIL hip pain   GAIT: Distance walked: 54f Assistive device utilized: None Level of assistance: Complete Independence Comments: BIL hip adduction with genu valgum  VITALS:   10/30/2021: BP: 103/50; HR: 79bpm; O2 sat: 97%     TODAY'S TREATMENT:  [facility] Adult PT Treatment:                                                DATE: 10/30/2021 Therapeutic Exercise: Mini-squat side steps with RTB above knees on treadmill at 0.623m x2m65mBIL Forward walking on treadmill with cues to decrease BIL genu valgum x2 min Standing hip extension with 7# cable to ankle attachment at Free Motion machine 2x10 BIL Manual Therapy: N/A Neuromuscular re-ed: N/A Therapeutic Activity: Re-assessment of objective measures with pt education Dead lift with subsequent mini squat side step down and back length of table with 15# kettlebell x5 Administration of walking program with pt education Modalities: N/A Self Care: N/A   [facility]                                                [facility]:  [facility]20[facility] Therapeutic Exercise: Forward lunge to 8-inch step 2x10 Standing hip extension with 10# cable 2x10 BIL Standing hamstring curl with 10# cable 2x10 BIL Standing hip abduction with 10# cable 2x10 BIL Squat into overhead reach and heel raise with two 13# cables to waist attachment at Free Motion machine 2x10 Dead lift with  45# hex bar 2x8 Standing IT band stretch x21mn BIL Manual Therapy: Grade 3 lateral Lt hip distraction with mobilization belt x8 minutes Neuromuscular re-ed: N/A Therapeutic Activity: N/A Modalities: N/A Self Care: N/A   [facility] Adult PT Treatment:                                                DATE: 09/13/2021 Therapeutic Exercise: Lt kickstand stance on Airex pad with 3# cable Pallof press 2x8 BIL Self-hip distraction on Lt with forward lunge and sheet around upper thigh x5 Mini-squat side steps with 7# waist attachment x5 walk-outs BIL Supine 90/90 abdominal isometric with handhold resistance 4x30sec Seated butterfly stretch x218m Manual Therapy: Grade 3 lateral Lt hip distraction with mobilization belt x8 minutes Neuromuscular re-ed: N/A Therapeutic Activity: N/A Modalities: N/A Self Care: N/A        PATIENT EDUCATION:  Education details: Pt educated on probable underlying pathophysiology behind her pain presentation, prognosis, POC, FOTO, and HEP Person educated: Patient Education method: Explanation, Demonstration, and Handouts Education comprehension: verbalized understanding and returned demonstration     HOME EXERCISE PROGRAM: Access Code: [email or web address]: [email or web address] Date: 08/16/2021 Prepared by: TuVanessa [place] Exercises - Squat with Chair Touch  - 1 x daily - 7 x weekly - 3 sets - 10 reps - Side Plank with Clam  - 1 x daily - 7 x weekly - 2 sets - 10 reps - Supine Bridge  - 1 x daily - 7 x weekly - 3 sets - 10 reps - 3-sec hold   ASSESSMENT:   CLINICAL IMPRESSION: Upon re-assessment, pt has made  good progress in hip strength, although she continues to have pain with A/PROM of her Lt hip. Upon review of her gait, BIL genu valgum was observed with associated BIL hip adduction. A walking program was provided to the pt to begin low-level weight-bearing activity. She will continue to benefit from skilled PT to address her primary impairments and return to her prior level of function with less limitation.      OBJECTIVE IMPAIRMENTS Abnormal gait, decreased mobility, difficulty walking, decreased ROM, decreased strength, hypomobility, impaired flexibility, improper body mechanics, postural dysfunction, and pain.    ACTIVITY LIMITATIONS cleaning, community activity, laundry, yard work, and shopping.    PERSONAL FACTORS Time since onset of injury/illness/exacerbation and 3+ comorbidities: See medical hx  are also affecting patient's functional outcome.          GOALS: Goals reviewed with patient? Yes   SHORT TERM GOALS: Target date: 09/13/2021   Pt will report understanding and adherence to her HEP in order to promote independence in the management of her primary impairments. Baseline: HEP provided at eval 10/30/2021: Non-adherence Goal status: IN PROGRESS       LONG TERM GOALS: Target date: 10/11/2021   Pt will achieve a FOTO score of 64% in order to demonstrate improved functional ability as it relates to her primary impairments. Baseline: 47% Goal status: INITIAL   2.  Pt will achieve BIL global hip strength of 4+/5 or greater in order to return to hiking with less limitation. Baseline: See MMT chart 10/30/2021: See updated MMT chart Goal status: ACHIEVED   3.  Pt will report ability to walk >30 minutes with 0-2/10 pain in order to grocery shop without limitation. Baseline: >6/10 pain after 20 minutes of walking Goal status: INITIAL  4.  Pt will achieve Lt global hip AROM within 5 degrees of Rt in order to get dressed without limitation. Baseline: limited in all hip motions on  Lt compared to Rt 10/30/2021: See updated ROM chart Goal status: IN PROGRESS   5.  Pt will achieve WNL single-leg squats with 0-2/10 pain in order to return to Yoga with less limitation. Baseline:  Goal status: INITIAL     PLAN: PT FREQUENCY: 1x/week   PT DURATION: 6 weeks   PLANNED INTERVENTIONS: Therapeutic exercises, Therapeutic activity, Neuromuscular re-education, Balance training, Gait training, Patient/Family education, Joint mobilization, Stair training, Aquatic Therapy, Dry Needling, Electrical stimulation, Spinal manipulation, Spinal mobilization, Cryotherapy, Moist heat, Taping, Vasopneumatic device, Traction, Ionotophoresis 59m/ml Dexamethasone, and Manual therapy   PLAN FOR NEXT SESSION: Progress closed-chain hip strengthening, ROM, consider hip distraction techniques     TCherie Ouch PT 10/30/2021, 10:59 AM    PHYSICAL THERAPY DISCHARGE SUMMARY  Visits from Start of Care: 5  Current functional level related to goals / functional outcomes: Pt made progress in global hip strength and ROM   Remaining deficits: Hip pain, functional mobility   Education / Equipment: HEP   Patient agrees to discharge. Patient goals were partially met. Patient is being discharged due to not returning since the last visit.  YVanessa [address]ow [address] PT, DPT 01/03/22 9:42 AM

## 2021-10-30 NOTE — Patient Instructions (Signed)
WALKING PROGRAM:  5min/ day  3 days/week   Increase time walked by 2 minutes each week i.e. 7 min/day week 2, 9 min/day week 3, etc.  Once you get to 30 minutes/ day, increase days per week from 3 to 4 days per week.  At this point, you may introduce 1-55minutes of light jogging per walking session. Incrementally increase time jogging each week in the same manner that you increased walking per week.

## 2021-11-01 NOTE — Progress Notes (Signed)
Primary Physician/Referring:  Janith Lima, MD  Patient ID: Katherine Cruz, female    DOB: 1965/08/21, 56 y.o.   MRN: [id number]  Chief Complaint  Patient presents with   HLD   Palpitations    1 YEAR    HPI:    Katherine Cruz  is a 56 y.o. female with history of fibromyalgia, mild hyperlipidemia, tobacco use disorder (30-pack-year history quit in 2014), and mitral valve prolapse.  Patient also has a history of chronic dyspnea evaluated by Dr. Vaughan Browner in 2019 and diagnosed with minimal emphysema.  Coronary calcium score in 2019 was 0.  She does have chronic palpitations suggestive of PVC/PAC.   Patient was last seen in the office 10/27/2020 at which time advised watchful waiting in regard to palpitations and ordered repeat lipid profile testing which unfortunately appears not to have been done.  Patient now presents for annual follow-up.  She has complaints of shortness of breath as well as palpitations, particularly with exertion.  She also reports a recent severe episode of dizziness as well as an episode of chest pain in February.  She has had no recurrence of chest pain and EMS evaluation at that time was unremarkable.  Past Medical History:  Diagnosis Date   Allergic rhinitis    Allergy    Brain fog    Breast mass 08/23/2010   Depression    Eating disorder    Emphysema of lung (HCC)    Fibrocystic breast    Fibromyalgia    Fibromyalgia    GERD (gastroesophageal reflux disease)    Heart murmur    Hyperlipidemia    MVP (mitral valve prolapse)    Osteoarthritis    Ovarian cyst    PAC (premature atrial contraction)    Positive PPD    PTSD (post-traumatic stress disorder)    PVC (premature ventricular contraction)    UTI (urinary tract infection)    UTI (urinary tract infection)    Past Surgical History:  Procedure Laterality Date   CERVICAL POLYPECTOMY     MAXILLARY SINUS LIFT     REPLANTATION THUMB     SINUS LIFT WITH BONE GRAFT     TUBAL LIGATION     Family History   Problem Relation Age of Onset   Arthritis Mother    Hearing loss Mother    Mental illness Mother    Irritable bowel syndrome Mother    Allergies Father    Alcohol abuse Father    Drug abuse Sister    Mental illness Sister    Heart disease Neg Hx    Hypertension Neg Hx    Diabetes Neg Hx    Cancer Neg Hx        breast or colon   Stroke Neg Hx    Colon cancer Neg Hx     Social History   Tobacco Use   Smoking status: Former    Packs/day: 1.00    Years: 35.00    Total pack years: 35.00    Types: Cigarettes    Quit date: 2015    Years since quitting: 8.4   Smokeless tobacco: Never  Substance Use Topics   Alcohol use: No   Marital Status: Divorced   ROS  Review of Systems  Constitutional: Negative.   HENT: Negative.    Respiratory:  Positive for shortness of breath (chronic, associated with emotional siuations).   Cardiovascular:  Positive for palpitations (chronic, about every 5 days). Negative for chest pain, orthopnea, leg swelling and PND.  Gastrointestinal:  Positive for heartburn.  Genitourinary: Negative.   Musculoskeletal:  Positive for myalgias.  Skin: Negative.   Neurological:  Positive for dizziness (occasional). Negative for focal weakness.    Objective  Blood pressure 104/64, pulse 71, temperature 98.1 F (36.7 C), temperature source Temporal, resp. rate 16, height 5' 4" (1.626 m), weight 114 lb 6.4 oz (51.9 kg), last menstrual period 03/24/2015, SpO2 100 %.     11/02/2021   10:08 AM 06/29/2021    1:41 PM 05/31/2021    3:12 PM  Vitals with BMI  Height 5' 4" 5' 4" 5' 4"  Weight 114 lbs 6 oz 117 lbs 116 lbs  BMI 19.63 34.74 25.9  Systolic 563 875 643  Diastolic 64 70 60  Pulse 71 76 72   Physical Exam Vitals reviewed.  Constitutional:      General: She is not in acute distress.    Appearance: She is normal weight.  Neck:     Vascular: No carotid bruit.  Cardiovascular:     Rate and Rhythm: Normal rate and regular rhythm.     Pulses:           Carotid pulses are 2+ on the right side and 2+ on the left side.      Radial pulses are 2+ on the right side and 2+ on the left side.       Femoral pulses are 2+ on the right side and 2+ on the left side.      Popliteal pulses are 2+ on the right side and 2+ on the left side.       Dorsalis pedis pulses are 1+ on the right side and 1+ on the left side.       Posterior tibial pulses are 2+ on the right side and 2+ on the left side.     Heart sounds: Murmur heard.     Systolic murmur is present with a grade of 2/6.     Comments: Crescendo-decrescendo mid to late systolic murmur is present with a grade of 2/6 at the apex. Midsystolic click present.  Pulmonary:     Effort: Pulmonary effort is normal.     Breath sounds: Normal breath sounds.  Neurological:     Mental Status: She is alert.   Physical exam unchanged compared to previous office visit.  Laboratory examination:   Recent Labs    03/19/21 1357  NA 140  K 3.6  CL 100  CO2 29  GLUCOSE 82  BUN 13  CREATININE 0.65  CALCIUM 9.6   CrCl cannot be calculated (Patient's most recent lab result is older than the maximum 21 days allowed.).     Latest Ref Rng & Units 03/19/2021    1:57 PM 04/20/2020    3:22 PM 06/01/2012    3:40 PM  CMP  Glucose 70 - 99 mg/dL 82  91  97   BUN 6 - 23 mg/dL _0 Creatinine 0.40 - 1.20 mg/dL 0.65  0.62  0.55   Sodium 135 - 145 mEq/L 140  143  139   Potassium 3.5 - 5.1 mEq/L 3.6  3.6  4.2   Chloride 96 - 112 mEq/L 100  102  99   CO2 19 - 32 mEq/L 29  33  26   Calcium 8.4 - 10.5 mg/dL 9.6  9.4    9.5  9.6   Total Protein 6.0 - 8.3 g/dL 7.3  7.0    Total Bilirubin 0.2 -  1.2 mg/dL 0.9  0.5    Alkaline Phos 39 - 117 U/L 67  59    AST 0 - 37 U/L 25  21    ALT 0 - 35 U/L 25  20        Latest Ref Rng & Units 03/19/2021    1:57 PM 04/20/2020    3:22 PM 01/02/2018    4:14 PM  CBC  WBC 4.0 - 10.5 K/uL 5.2  3.9  4.6   Hemoglobin 12.0 - 15.0 g/dL 14.3  13.9  13.5   Hematocrit 36.0 - 46.0 %  42.5  41.4  41.1   Platelets 150.0 - 400.0 K/uL 200.0  193.0  214.0     Lipid Panel No results for input(s): "CHOL", "TRIG", "[place]", "VLDL", "HDL", "CHOLHDL", "LDLDIRECT" in the last 8760 hours.   HEMOGLOBIN A1C No results found for: "HGBA1C", "MPG" TSH Recent Labs    03/19/21 1357  TSH 4.15    External labs:  None   Allergies   Allergies  Allergen Reactions   Latex Itching   Nickel Rash    Medications Prior to Visit:   Outpatient Medications Prior to Visit  Medication Sig Dispense Refill   omeprazole (PRILOSEC) 40 MG capsule Take 1 capsule (40 mg total) by mouth daily. 90 capsule 3   No facility-administered medications prior to visit.   Final Medications at End of Visit    No outpatient medications have been marked as taking for the 11/02/21 encounter (Office Visit) with Rayetta Pigg, Celeste C, PA-C.   Radiology:   No results found.  CT Chest 01/16/2018:  1. No evidence of a LEFT LOWER LOBE lung nodule as questioned on the recent chest x-ray. I believe calcification in the costal cartilage of the anterior seventh rib accounts for the chest x-ray finding. 2.  No acute cardiopulmonary disease.  Emphysema (ICD10-J43.9).  Cardiac Studies:   Echocardiogram 07/28/2020:  Normal LV systolic function with visual EF 55-60%. Left ventricle cavity is normal in size. Normal global wall motion. Normal diastolic filling pattern, normal LAP.  Mild (Grade I) mitral regurgitation.  Mild tricuspid regurgitation. No evidence of pulmonary hypertension.  IVC is dilated with a respiratory response of <50%.  Compared to prior study dated 12/28/2014: No significant change.   Holter monitor 48 hours 01/05/2018:  Normal sinus rhythm.  Rare PVCs.  Rare PACs.  No patient triggered events.  Coronary Calcium Score 12/31/2017: 0. Visible lung fields are clear.   Treadmill exercise stress test 10/07/2016: EKG demonstrates NSR. The patient exercised according to Bruce Protocol, total time  recorded 5:05 min achieving max heart rate of 158 bpm, which was 93% of THR for age and 7.05 METS of work. Stress terminated due to dyspnea, fatigue, lightheadedness, and THR met. Normal BP response. There was no ST-T changes of ischemia with exercise stress test. There were no significant arrhythmias. No evidence of ischemia by GXT. Exercise tolerance is markedly reduced for age. Consider evaluation for non cardiac etiology for dyspnea. Continue preventative therapy.   EKG:  11/02/2021: Sinus rhythm at a rate of 78 bpm.  Normal axis.  No evidence of ischemia or underlying injury pattern.  Compared EKG 05/04/2020, no significant change.  Assessment     ICD-10-CM   1. Palpitations  R00.2 EKG 12-Lead    PCV CARDIAC STRESS TEST    LONG TERM MONITOR (3-14 DAYS)    Lipid Panel With LDL/HDL Ratio    Direct LDL    2. Hypertriglyceridemia  E78.1 Lipid Panel With  LDL/HDL Ratio    Direct LDL      There are no discontinued medications.   No orders of the defined types were placed in this encounter.   Recommendations:   Katherine Cruz is a 56 y.o. Caucasian female with history of fibromyalgia, mild hyperlipidemia, mitral valve prolapse, tobacco use disorder (30-pack-year history quit in 2014) with coronary calcium score 0 in 2019. She has a history of chronic palpitations suggestive of PVC/PAC. Patient also with chronic dyspnea due to underlying emphysema.   Patient was last seen in the office 10/27/2020 at which time advised watchful waiting in regard to palpitations and ordered repeat lipid profile testing which unfortunately appears not to have been done.  Patient now presents for annual follow-up.  Given shortness of breath and single episode of chest pain as well as palpitations with exertion recommend GXT.  We will also obtain 2-week cardiac monitor to evaluate for underlying dysrhythmias.  Patient does have reassuring coronary calcium score of 0 in 2019.  Suspicion for cardiac etiology of dyspnea  is low, however stress test will help further evaluate concern.  We will also obtain repeat lipid profile testing given history of hypertriglyceridemia.  Follow-up in 1 year, sooner if needed.   Alethia Berthold, PA-C 11/02/2021, 11:43 AM Office: [phone]

## 2021-11-02 ENCOUNTER — Ambulatory Visit: Payer: 59 | Admitting: Student

## 2021-11-02 ENCOUNTER — Encounter: Payer: Self-pay | Admitting: Student

## 2021-11-02 VITALS — BP 104/64 | HR 71 | Temp 98.1°F | Resp 16 | Ht 64.0 in | Wt 114.4 lb

## 2021-11-02 DIAGNOSIS — R002 Palpitations: Secondary | ICD-10-CM

## 2021-11-02 DIAGNOSIS — E781 Pure hyperglyceridemia: Secondary | ICD-10-CM

## 2021-11-21 ENCOUNTER — Ambulatory Visit: Payer: 59 | Attending: Physician Assistant

## 2021-11-27 ENCOUNTER — Telehealth: Payer: Self-pay

## 2021-11-27 NOTE — Telephone Encounter (Signed)
Patient called and stated that she has mitral valve prolapse and wanted to know if it is necessary to take the antibiotics before her dental appointment. Please advise. She will need to know by 9:15am

## 2021-11-28 ENCOUNTER — Ambulatory Visit: Payer: 59

## 2021-12-04 LAB — LIPID PANEL WITH LDL/HDL RATIO
Cholesterol, Total: 213 mg/dL — ABNORMAL HIGH (ref 100–199)
HDL: 77 mg/dL (ref >39)
LDL Chol Calc (NIH): 127 mg/dL — ABNORMAL HIGH (ref 0–99)
LDL/HDL Ratio: 1.6 ratio (ref 0.0–3.2)
Triglycerides: 50 mg/dL (ref 0–149)
VLDL Cholesterol Cal: 9 mg/dL (ref 5–40)

## 2021-12-04 LAB — LDL CHOLESTEROL, DIRECT: LDL Direct: 133 mg/dL — ABNORMAL HIGH (ref 0–99)

## 2021-12-10 NOTE — Progress Notes (Signed)
Called pt to inform her about her lab results, pt understood

## 2021-12-14 ENCOUNTER — Inpatient Hospital Stay: Payer: 59

## 2021-12-14 DIAGNOSIS — R002 Palpitations: Secondary | ICD-10-CM

## 2022-03-14 ENCOUNTER — Telehealth: Payer: Self-pay | Admitting: Internal Medicine

## 2022-03-14 NOTE — Telephone Encounter (Signed)
LVM for pt to rtn my call to schedule AWV with [facility] call back # [phone] 

## 2022-04-02 ENCOUNTER — Ambulatory Visit: Payer: 59 | Admitting: Internal Medicine

## 2022-05-08 ENCOUNTER — Ambulatory Visit: Payer: 59 | Admitting: Internal Medicine

## 2022-05-09 ENCOUNTER — Ambulatory Visit: Payer: 59 | Admitting: Internal Medicine

## 2022-05-22 ENCOUNTER — Telehealth: Payer: Self-pay | Admitting: Internal Medicine

## 2022-05-22 NOTE — Telephone Encounter (Signed)
Patient aware and has appointment coming up

## 2022-05-22 NOTE — Telephone Encounter (Signed)
Patient called and said that her therapist recommended she see [facility], she contacted them and they need a referral from her PCP so she wanted to know if it can be placed. Address: [address] [place], [place], [place] [address] Phone: [phone]  Callback for patient [phone]

## 2022-06-11 ENCOUNTER — Ambulatory Visit: Payer: 59 | Admitting: Internal Medicine

## 2022-07-22 ENCOUNTER — Ambulatory Visit: Payer: 59 | Admitting: Internal Medicine

## 2022-07-29 ENCOUNTER — Telehealth: Payer: Self-pay

## 2022-07-29 NOTE — Telephone Encounter (Signed)
Contacted [name] to schedule their annual wellness visit. Appointment made for 08/06/22.  Norton Blizzard, [place] ([facility])  [facility] [phone]

## 2022-08-06 ENCOUNTER — Ambulatory Visit (INDEPENDENT_AMBULATORY_CARE_PROVIDER_SITE_OTHER): Payer: 59

## 2022-08-06 VITALS — Ht 64.0 in | Wt 117.0 lb

## 2022-08-06 DIAGNOSIS — Z1231 Encounter for screening mammogram for malignant neoplasm of breast: Secondary | ICD-10-CM | POA: Diagnosis not present

## 2022-08-06 DIAGNOSIS — Z1211 Encounter for screening for malignant neoplasm of colon: Secondary | ICD-10-CM | POA: Diagnosis not present

## 2022-08-06 DIAGNOSIS — Z0000 Encounter for general adult medical examination without abnormal findings: Secondary | ICD-10-CM | POA: Diagnosis not present

## 2022-08-06 NOTE — Patient Instructions (Signed)
Katherine Cruz , Thank you for taking time to come for your Medicare Wellness Visit. I appreciate your ongoing commitment to your health goals. Please review the following plan we discussed and let me know if I can assist you in the future.   These are the goals we discussed:  Goals      Increase physical activity        This is a list of the screening recommended for you and due dates:  Health Maintenance  Topic Date Due   Zoster (Shingles) Vaccine (1 of 2) Never done   Colon Cancer Screening  Never done   Screening for Lung Cancer  01/17/2019   Flu Shot  12/18/2021   COVID-19 Vaccine (4 - 2023-24 season) 01/18/2022   Mammogram  01/13/2023   Pap Smear  06/29/2023   Medicare Annual Wellness Visit  08/06/2023   DTaP/Tdap/Td vaccine (3 - Td or Tdap) 02/02/2030   Hepatitis C Screening: USPSTF Recommendation to screen - Ages 55-79 yo.  Completed   HIV Screening  Completed   HPV Vaccine  Aged Out    Advanced directives: Advance directive discussed with you today. I have provided a copy for you to complete at home and have notarized. Once this is complete please bring a copy in to our office so we can scan it into your chart.   Conditions/risks identified: Aim for 30 minutes of exercise or brisk walking, 6-8 glasses of water, and 5 servings of fruits and vegetables each day.   Next appointment: Follow up in one year for your annual wellness visit.   Your mammogram has been ordered for [facility]:  The number to schedule is [phone]  Preventive Care 40-64 Years, Female Preventive care refers to lifestyle choices and visits with your health care provider that can promote health and wellness. What does preventive care include? A yearly physical exam. This is also called an annual well check. Dental exams once or twice a year. Routine eye exams. Ask your health care provider how often you should have your eyes checked. Personal lifestyle choices, including: Daily care of your  teeth and gums. Regular physical activity. Eating a healthy diet. Avoiding tobacco and drug use. Limiting alcohol use. Practicing safe sex. Taking low-dose aspirin daily starting at age 30. Taking vitamin and mineral supplements as recommended by your health care provider. What happens during an annual well check? The services and screenings done by your health care provider during your annual well check will depend on your age, overall health, lifestyle risk factors, and family history of disease. Counseling  Your health care provider may ask you questions about your: Alcohol use. Tobacco use. Drug use. Emotional well-being. Home and relationship well-being. Sexual activity. Eating habits. Work and work Statistician. Method of birth control. Menstrual cycle. Pregnancy history. Screening  You may have the following tests or measurements: Height, weight, and BMI. Blood pressure. Lipid and cholesterol levels. These may be checked every 5 years, or more frequently if you are over 40 years old. Skin check. Lung cancer screening. You may have this screening every year starting at age 76 if you have a 30-pack-year history of smoking and currently smoke or have quit within the past 15 years. Fecal occult blood test (FOBT) of the stool. You may have this test every year starting at age 41. Flexible sigmoidoscopy or colonoscopy. You may have a sigmoidoscopy every 5 years or a colonoscopy every 10 years starting at age 41. Hepatitis C blood test. Hepatitis B  blood test. Sexually transmitted disease (STD) testing. Diabetes screening. This is done by checking your blood sugar (glucose) after you have not eaten for a while (fasting). You may have this done every 1-3 years. Mammogram. This may be done every 1-2 years. Talk to your health care provider about when you should start having regular mammograms. This may depend on whether you have a family history of breast cancer. BRCA-related cancer  screening. This may be done if you have a family history of breast, ovarian, tubal, or peritoneal cancers. Pelvic exam and Pap test. This may be done every 3 years starting at age 39. Starting at age 41, this may be done every 5 years if you have a Pap test in combination with an HPV test. Bone density scan. This is done to screen for osteoporosis. You may have this scan if you are at high risk for osteoporosis. Discuss your test results, treatment options, and if necessary, the need for more tests with your health care provider. Vaccines  Your health care provider may recommend certain vaccines, such as: Influenza vaccine. This is recommended every year. Tetanus, diphtheria, and acellular pertussis (Tdap, Td) vaccine. You may need a Td booster every 10 years. Zoster vaccine. You may need this after age 43. Pneumococcal 13-valent conjugate (PCV13) vaccine. You may need this if you have certain conditions and were not previously vaccinated. Pneumococcal polysaccharide (PPSV23) vaccine. You may need one or two doses if you smoke cigarettes or if you have certain conditions. Talk to your health care provider about which screenings and vaccines you need and how often you need them. This information is not intended to replace advice given to you by your health care provider. Make sure you discuss any questions you have with your health care provider. Document Released: 06/02/2015 Document Revised: 01/24/2016 Document Reviewed: 03/07/2015 Elsevier Interactive Patient Education  2017 [employer] Prevention in the Home Falls can cause injuries. They can happen to people of all ages. There are many things you can do to make your home safe and to help prevent falls. What can I do on the outside of my home? Regularly fix the edges of walkways and driveways and fix any cracks. Remove anything that might make you trip as you walk through a door, such as a raised step or threshold. Trim any  bushes or trees on the path to your home. Use bright outdoor lighting. Clear any walking paths of anything that might make someone trip, such as rocks or tools. Regularly check to see if handrails are loose or broken. Make sure that both sides of any steps have handrails. Any raised decks and porches should have guardrails on the edges. Have any leaves, snow, or ice cleared regularly. Use sand or salt on walking paths during winter. Clean up any spills in your garage right away. This includes oil or grease spills. What can I do in the bathroom? Use night lights. Install grab bars by the toilet and in the tub and shower. Do not use towel bars as grab bars. Use non-skid mats or decals in the tub or shower. If you need to sit down in the shower, use a plastic, non-slip stool. Keep the floor dry. Clean up any water that spills on the floor as soon as it happens. Remove soap buildup in the tub or shower regularly. Attach bath mats securely with double-sided non-slip rug tape. Do not have throw rugs and other things on the floor that can make  you trip. What can I do in the bedroom? Use night lights. Make sure that you have a light by your bed that is easy to reach. Do not use any sheets or blankets that are too big for your bed. They should not hang down onto the floor. Have a firm chair that has side arms. You can use this for support while you get dressed. Do not have throw rugs and other things on the floor that can make you trip. What can I do in the kitchen? Clean up any spills right away. Avoid walking on wet floors. Keep items that you use a lot in easy-to-reach places. If you need to reach something above you, use a strong step stool that has a grab bar. Keep electrical cords out of the way. Do not use floor polish or wax that makes floors slippery. If you must use wax, use non-skid floor wax. Do not have throw rugs and other things on the floor that can make you trip. What can I do  with my stairs? Do not leave any items on the stairs. Make sure that there are handrails on both sides of the stairs and use them. Fix handrails that are broken or loose. Make sure that handrails are as long as the stairways. Check any carpeting to make sure that it is firmly attached to the stairs. Fix any carpet that is loose or worn. Avoid having throw rugs at the top or bottom of the stairs. If you do have throw rugs, attach them to the floor with carpet tape. Make sure that you have a light switch at the top of the stairs and the bottom of the stairs. If you do not have them, ask someone to add them for you. What else can I do to help prevent falls? Wear shoes that: Do not have high heels. Have rubber bottoms. Are comfortable and fit you well. Are closed at the toe. Do not wear sandals. If you use a stepladder: Make sure that it is fully opened. Do not climb a closed stepladder. Make sure that both sides of the stepladder are locked into place. Ask someone to hold it for you, if possible. Clearly mark and make sure that you can see: Any grab bars or handrails. First and last steps. Where the edge of each step is. Use tools that help you move around (mobility aids) if they are needed. These include: Canes. Walkers. Scooters. Crutches. Turn on the lights when you go into a dark area. Replace any light bulbs as soon as they burn out. Set up your furniture so you have a clear path. Avoid moving your furniture around. If any of your floors are uneven, fix them. If there are any pets around you, be aware of where they are. Review your medicines with your doctor. Some medicines can make you feel dizzy. This can increase your chance of falling. Ask your doctor what other things that you can do to help prevent falls. This information is not intended to replace advice given to you by your health care provider. Make sure you discuss any questions you have with your health care  provider. Document Released: 03/02/2009 Document Revised: 10/12/2015 Document Reviewed: 06/10/2014 Elsevier Interactive Patient Education  2017 [employer].
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
## 2022-09-05 ENCOUNTER — Encounter: Payer: Self-pay | Admitting: Internal Medicine

## 2022-09-05 ENCOUNTER — Ambulatory Visit (INDEPENDENT_AMBULATORY_CARE_PROVIDER_SITE_OTHER): Payer: 59 | Admitting: Internal Medicine

## 2022-09-05 VITALS — BP 118/68 | HR 69 | Temp 98.2°F | Ht 64.0 in | Wt 114.0 lb

## 2022-09-05 DIAGNOSIS — J438 Other emphysema: Secondary | ICD-10-CM | POA: Diagnosis not present

## 2022-09-05 DIAGNOSIS — Z0001 Encounter for general adult medical examination with abnormal findings: Secondary | ICD-10-CM

## 2022-09-05 DIAGNOSIS — Z87891 Personal history of nicotine dependence: Secondary | ICD-10-CM | POA: Diagnosis not present

## 2022-09-05 DIAGNOSIS — R251 Tremor, unspecified: Secondary | ICD-10-CM

## 2022-09-05 DIAGNOSIS — R5383 Other fatigue: Secondary | ICD-10-CM | POA: Diagnosis not present

## 2022-09-05 DIAGNOSIS — R7989 Other specified abnormal findings of blood chemistry: Secondary | ICD-10-CM

## 2022-09-05 DIAGNOSIS — Z1231 Encounter for screening mammogram for malignant neoplasm of breast: Secondary | ICD-10-CM

## 2022-09-05 DIAGNOSIS — R002 Palpitations: Secondary | ICD-10-CM | POA: Diagnosis not present

## 2022-09-05 DIAGNOSIS — F411 Generalized anxiety disorder: Secondary | ICD-10-CM

## 2022-09-05 DIAGNOSIS — R0609 Other forms of dyspnea: Secondary | ICD-10-CM | POA: Insufficient documentation

## 2022-09-05 DIAGNOSIS — Z23 Encounter for immunization: Secondary | ICD-10-CM

## 2022-09-05 LAB — HEPATIC FUNCTION PANEL
ALT: 20 U/L (ref 0–35)
AST: 20 U/L (ref 0–37)
Albumin: 4.3 g/dL (ref 3.5–5.2)
Alkaline Phosphatase: 61 U/L (ref 39–117)
Bilirubin, Direct: 0.1 mg/dL (ref 0.0–0.3)
Total Bilirubin: 0.6 mg/dL (ref 0.2–1.2)
Total Protein: 6.7 g/dL (ref 6.0–8.3)

## 2022-09-05 LAB — BASIC METABOLIC PANEL
BUN: 17 mg/dL (ref 6–23)
CO2: 33 mEq/L — ABNORMAL HIGH (ref 19–32)
Calcium: 9.1 mg/dL (ref 8.4–10.5)
Chloride: 102 mEq/L (ref 96–112)
Creatinine, Ser: 0.65 mg/dL (ref 0.40–1.20)
GFR: 97.41 mL/min (ref >60.00)
Glucose, Bld: 92 mg/dL (ref 70–99)
Potassium: 4.1 mEq/L (ref 3.5–5.1)
Sodium: 140 mEq/L (ref 135–145)

## 2022-09-05 LAB — CBC WITH DIFFERENTIAL/PLATELET
Basophils Absolute: 0 10*3/uL (ref 0.0–0.1)
Basophils Relative: 0.4 % (ref 0.0–3.0)
Eosinophils Absolute: 0.1 10*3/uL (ref 0.0–0.7)
Eosinophils Relative: 2.5 % (ref 0.0–5.0)
HCT: 40.6 % (ref 36.0–46.0)
Hemoglobin: 13.7 g/dL (ref 12.0–15.0)
Lymphocytes Relative: 36.7 % (ref 12.0–46.0)
Lymphs Abs: 1.7 10*3/uL (ref 0.7–4.0)
MCHC: 33.7 g/dL (ref 30.0–36.0)
MCV: 92.9 fl (ref 78.0–100.0)
Monocytes Absolute: 0.3 10*3/uL (ref 0.1–1.0)
Monocytes Relative: 7.2 % (ref 3.0–12.0)
Neutro Abs: 2.4 10*3/uL (ref 1.4–7.7)
Neutrophils Relative %: 53.2 % (ref 43.0–77.0)
Platelets: 210 10*3/uL (ref 150.0–400.0)
RBC: 4.37 Mil/uL (ref 3.87–5.11)
RDW: 13.9 % (ref 11.5–15.5)
WBC: 4.5 10*3/uL (ref 4.0–10.5)

## 2022-09-05 MED ORDER — SHINGRIX 50 MCG/0.5ML IM SUSR: 0.5000 mL | Freq: Once | INTRAMUSCULAR | 1 refills | Status: AC

## 2022-09-05 NOTE — Patient Instructions (Signed)

## 2022-09-05 NOTE — Progress Notes (Signed)
Subjective:  Patient ID: Katherine Cruz, female    DOB: September 13, 1965  Age: 57 y.o. MRN: [id number]  CC: Annual Exam   HPI Katherine Cruz presents for a CPX and f/up -   She thinks she has ADHD and autism.  She wants to be referred to the [facility].  She has persistent unchanged symptoms including intermittent palpitations, dyspnea on exertion, and fatigue.  She denies chest pain, diaphoresis, or edema.  She continues to complain of tremors and would like to see a neurologist.    No outpatient medications prior to visit.   No facility-administered medications prior to visit.    ROS Review of Systems  Constitutional:  Positive for fatigue. Negative for appetite change, chills, diaphoresis, fever and unexpected weight change.  HENT: Negative.    Eyes: Negative.  Negative for visual disturbance.  Respiratory:  Positive for shortness of breath. Negative for cough, chest tightness and wheezing.   Cardiovascular:  Negative for chest pain, palpitations and leg swelling.  Gastrointestinal:  Negative for abdominal pain, blood in stool, constipation, diarrhea and nausea.  Endocrine: Negative.   Genitourinary: Negative.  Negative for difficulty urinating.  Musculoskeletal:  Positive for arthralgias. Negative for back pain, myalgias and neck pain.  Skin: Negative.  Negative for color change and pallor.  Neurological:  Positive for dizziness and tremors. Negative for weakness.  Hematological:  Negative for adenopathy. Does not bruise/bleed easily.  Psychiatric/Behavioral:  Negative for behavioral problems, confusion, decreased concentration, dysphoric mood, sleep disturbance and suicidal ideas. The patient is nervous/anxious.     Objective:  BP 118/68 (BP Location: Left Arm, Patient Position: Sitting, Cuff Size: Large)   Pulse 69   Temp 98.2 F (36.8 C) (Oral)   Ht 5\' 4"  (1.626 m)   Wt 114 lb (51.7 kg)   LMP 03/24/2015 (Approximate)   SpO2 98%   BMI 19.57 kg/m   BP  Readings from Last 3 Encounters:  09/05/22 118/68  11/02/21 104/64  06/29/21 118/70    Wt Readings from Last 3 Encounters:  09/05/22 114 lb (51.7 kg)  08/06/22 117 lb (53.1 kg)  11/02/21 114 lb 6.4 oz (51.9 kg)    Physical Exam Vitals reviewed.  Constitutional:      General: She is not in acute distress.    Appearance: She is underweight. She is ill-appearing. She is not toxic-appearing or diaphoretic.  HENT:     Nose: Nose normal.     Mouth/Throat:     Mouth: Mucous membranes are moist.  Eyes:     General: No scleral icterus.    Conjunctiva/sclera: Conjunctivae normal.  Cardiovascular:     Rate and Rhythm: Normal rate and regular rhythm.     Heart sounds: Normal heart sounds, S1 normal and S2 normal.     No gallop.     Comments: EKG- NSR, 65 bpm Normal EKG Pulmonary:     Effort: Pulmonary effort is normal.     Breath sounds: No stridor. No wheezing, rhonchi or rales.  Abdominal:     Palpations: There is no mass.     Tenderness: There is no abdominal tenderness. There is no guarding.     Hernia: No hernia is present.  Musculoskeletal:     Cervical back: Neck supple.     Right lower leg: No edema.     Left lower leg: No edema.  Lymphadenopathy:     Cervical: No cervical adenopathy.  Skin:    General: Skin is warm and dry.  Coloration: Skin is not pale.     Findings: No lesion.  Neurological:     General: No focal deficit present.     Mental Status: She is alert. Mental status is at baseline.  Psychiatric:        Attention and Perception: She is inattentive.        Mood and Affect: Mood is anxious and depressed. Affect is not flat or tearful.        Speech: She is communicative. Speech is tangential. Speech is not delayed.        Behavior: Behavior normal. Behavior is cooperative.        Thought Content: Thought content normal. Thought content is not paranoid or delusional. Thought content does not include homicidal or suicidal ideation.        Cognition and  Memory: Cognition normal.        Judgment: Judgment normal.     Lab Results  Component Value Date   WBC 4.5 09/05/2022   HGB 13.7 09/05/2022   HCT 40.6 09/05/2022   PLT 210.0 09/05/2022   GLUCOSE 92 09/05/2022   CHOL 213 (H) 12/03/2021   TRIG 50 12/03/2021   HDL 77 12/03/2021   LDLDIRECT 133 (H) 12/03/2021   LDLCALC 127 (H) 12/03/2021   ALT 20 09/05/2022   AST 20 09/05/2022   NA 140 09/05/2022   K 4.1 09/05/2022   CL 102 09/05/2022   CREATININE 0.65 09/05/2022   BUN 17 09/05/2022   CO2 33 (H) 09/05/2022   TSH 3.51 09/05/2022    US Abdomen Limited RUQ (LIVER/GB)  Result Date: 04/03/2021 CLINICAL DATA:  Bloating with right upper quadrant abdominal pain x1 week. EXAM: ULTRASOUND ABDOMEN LIMITED RIGHT UPPER QUADRANT COMPARISON:  None. FINDINGS: Gallbladder: Shadowing, echogenic gallstones are seen within the depended portion of the gallbladder lumen. The largest measures approximately 6.4 mm. The gallbladder wall measures 1.9 mm in thickness. No sonographic Murphy sign noted by sonographer. Common bile duct: Diameter: 2.2 mm Liver: No focal lesion identified. Within normal limits in parenchymal echogenicity. Portal vein is patent on color Doppler imaging with normal direction of blood flow towards the liver. Other: None. IMPRESSION: Cholelithiasis without evidence of acute cholecystitis. Electronically Signed   By: Aram Candela M.D.   On: 04/03/2021 23:02    Assessment & Plan:   Former cigarette smoker -     Ambulatory Referral for Lung Cancer Scre  Encounter for general adult medical examination with abnormal findings- Exam completed, labs reviewed, vaccines reviewed, cancer screenings addressed, patient education was given.  GAD (generalized anxiety disorder) -     Ambulatory referral to Psychology  Need for shingles vaccine -     Shingrix; Inject 0.5 mLs into the muscle once for 1 dose.  Dispense: 0.5 mL; Refill: 1  Other emphysema -     Ambulatory Referral for  Lung Cancer Scre  Tremor of both hands -     Thyroid Panel With TSH; Future -     Thyroid peroxidase antibody; Future -     Ambulatory referral to Neurology  Elevated TSH- Her TFTs are normal now. -     Thyroid Panel With TSH; Future -     Thyroid peroxidase antibody; Future  Other fatigue- EKG and labs are reassuring. -     EKG 12-Lead -     Thyroid Panel With TSH; Future -     Thyroid peroxidase antibody; Future -     Basic metabolic panel; Future -  CBC with Differential/Platelet; Future -     Hepatic function panel; Future  DOE (dyspnea on exertion)- EKG and labs are reassuring.  She has a low ASCVD risk score. -     EKG 12-Lead -     CBC with Differential/Platelet; Future  Intermittent palpitations- She has a history of PVCs and PACs.  She is not interested in having these treated. -     EKG 12-Lead -     Thyroid Panel With TSH; Future -     Thyroid peroxidase antibody; Future -     Basic metabolic panel; Future -     CBC with Differential/Platelet; Future -     Hepatic function panel; Future  Screening mammogram for breast cancer -     Digital Screening Mammogram, Left and Right; Future     Follow-up: Return in about 6 months (around 03/07/2023).  Sanda Linger, MD

## 2022-09-06 LAB — THYROID PANEL WITH TSH
Free Thyroxine Index: 2.6 (ref 1.4–3.8)
T3 Uptake: 31 % (ref 22–35)
T4, Total: 8.3 ug/dL (ref 5.1–11.9)
TSH: 3.51 mIU/L (ref 0.40–4.50)

## 2022-09-06 LAB — THYROID PEROXIDASE ANTIBODY: Thyroperoxidase Ab SerPl-aCnc: <1 IU/mL (ref <9)

## 2022-09-08 ENCOUNTER — Encounter: Payer: Self-pay | Admitting: Internal Medicine

## 2022-09-09 ENCOUNTER — Telehealth: Payer: Self-pay

## 2022-09-09 ENCOUNTER — Other Ambulatory Visit: Payer: Self-pay | Admitting: *Deleted

## 2022-09-09 DIAGNOSIS — Z122 Encounter for screening for malignant neoplasm of respiratory organs: Secondary | ICD-10-CM

## 2022-09-09 DIAGNOSIS — Z87891 Personal history of nicotine dependence: Secondary | ICD-10-CM

## 2022-09-09 NOTE — Telephone Encounter (Signed)
Pt is asking that a referral be sent in for evaluation for ADHD and Autism.  Please send referral to [facility] Fax: [phone].

## 2022-09-12 ENCOUNTER — Encounter: Payer: Self-pay | Admitting: Neurology

## 2022-09-12 NOTE — Telephone Encounter (Signed)
Pt called wanted to know if Dr. Yetta Barre has any suggestion for regular eye doctors advised pt she could call around but she wanted to know if Dr. Yetta Barre has any suggestions

## 2022-09-16 ENCOUNTER — Telehealth: Payer: Self-pay | Admitting: Internal Medicine

## 2022-09-16 NOTE — Telephone Encounter (Signed)
Pt called needing referral sent over to [facility] imaging Fax number is [phone]

## 2022-09-16 NOTE — Telephone Encounter (Signed)
LVM needing clarification on what type of referral she needs.

## 2022-09-17 ENCOUNTER — Ambulatory Visit: Payer: 59 | Admitting: Nurse Practitioner

## 2022-09-19 NOTE — Telephone Encounter (Signed)
Pt called back and she needs a Mammogram done pt may have breast cancer and having breast pain

## 2022-09-23 ENCOUNTER — Encounter: Payer: Self-pay | Admitting: Internal Medicine

## 2022-09-23 ENCOUNTER — Other Ambulatory Visit: Payer: Self-pay | Admitting: Internal Medicine

## 2022-09-23 DIAGNOSIS — N644 Mastodynia: Secondary | ICD-10-CM | POA: Insufficient documentation

## 2022-10-04 ENCOUNTER — Encounter: Payer: Self-pay | Admitting: Physician Assistant

## 2022-10-04 ENCOUNTER — Ambulatory Visit
Admission: RE | Admit: 2022-10-04 | Discharge: 2022-10-04 | Disposition: A | Discharge status: Home / Self Care | Payer: 59 | Source: Ambulatory Visit | Attending: Acute Care | Admitting: Acute Care

## 2022-10-04 ENCOUNTER — Ambulatory Visit (INDEPENDENT_AMBULATORY_CARE_PROVIDER_SITE_OTHER): Payer: 59 | Admitting: Physician Assistant

## 2022-10-04 DIAGNOSIS — Z87891 Personal history of nicotine dependence: Secondary | ICD-10-CM

## 2022-10-04 DIAGNOSIS — Z122 Encounter for screening for malignant neoplasm of respiratory organs: Secondary | ICD-10-CM

## 2022-10-04 NOTE — Patient Instructions (Signed)
Thank you for participating in the [facility]. It was our pleasure to meet you today. We will call you with the results of your scan within the next few days. Your scan will be assigned a Lung RADS category score by the physicians reading the scans.  This Lung RADS score determines follow up scanning.  See below for description of categories, and follow up screening recommendations. We will be in touch to schedule your follow up screening annually or based on recommendations of our providers. We will fax a copy of your scan results to your Primary Care Physician, or the physician who referred you to the program, to ensure they have the results. Please call the office if you have any questions or concerns regarding your scanning experience or results.  Our office number is [phone]. Please speak with Denise Phelps, RN. , or  Denise Buckner RN, They are  our Lung Cancer Screening RN.'s If They are unavailable when you call, Please leave a message on the voice mail. We will return your call at our earliest convenience.This voice mail is monitored several times a day.  Remember, if your scan is normal, we will scan you annually as long as you continue to meet the criteria for the program. (Age 50-80, Current smoker or smoker who has quit within the last 15 years). If you are a smoker, remember, quitting is the single most powerful action that you can take to decrease your risk of lung cancer and other pulmonary, breathing related problems. We know quitting is hard, and we are here to help.  Please let us know if there is anything we can do to help you meet your goal of quitting. If you are a former smoker, congratulations. We are proud of you! Remain smoke free! Remember you can refer friends or family members through the number above.  We will screen them to make sure they meet criteria for the program. Thank you for helping us take better care of you by  participating in Lung Screening.  You can receive free nicotine replacement therapy ( patches, gum or mints) by calling 1-800-QUIT NOW. Please call so we can get you on the path to becoming  a non-smoker. I know it is hard, but you can do this!  Lung RADS Categories:  Lung RADS 1: no nodules or definitely non-concerning nodules.  Recommendation is for a repeat annual scan in 12 months.  Lung RADS 2:  nodules that are non-concerning in appearance and behavior with a very low likelihood of becoming an active cancer. Recommendation is for a repeat annual scan in 12 months.  Lung RADS 3: nodules that are probably non-concerning , includes nodules with a low likelihood of becoming an active cancer.  Recommendation is for a 6-month repeat screening scan. Often noted after an upper respiratory illness. We will be in touch to make sure you have no questions, and to schedule your 6-month scan.  Lung RADS 4 A: nodules with concerning findings, recommendation is most often for a follow up scan in 3 months or additional testing based on our provider's assessment of the scan. We will be in touch to make sure you have no questions and to schedule the recommended 3 month follow up scan.  Lung RADS 4 B:  indicates findings that are concerning. We will be in touch with you to schedule additional diagnostic testing based on our provider's  assessment of the scan.  Other options for assistance in smoking cessation (   As covered by your insurance benefits)  Hypnosis for smoking cessation  [employer] [phone]  Acupuncture for smoking cessation  [facility] [phone]   

## 2022-10-04 NOTE — Progress Notes (Signed)
Virtual Visit via Telephone Note  I connected with Katherine Cruz on 10/04/22 at  9:00 AM EDT by telephone and verified that I am speaking with the correct person using two identifiers.  Location: Patient: home Provider: working virtually from home   I discussed the limitations, risks, security and privacy concerns of performing an evaluation and management service by telephone and the availability of in person appointments. I also discussed with the patient that there may be a patient responsible charge related to this service. The patient expressed understanding and agreed to proceed.      Shared Decision Making Visit Lung Cancer Screening Program [phone])   Eligibility: Age 57 y.o. Pack Years Smoking History Calculation 33 (# packs/per year x # years smoked) Recent History of coughing up blood  no Unexplained weight loss? no ( >Than 15 pounds within the last 6 months ) Prior History Lung / other cancer no (Diagnosis within the last 5 years already requiring surveillance chest CT Scans). Smoking Status Former Smoker Former Smokers: Years since quit: 10 years  Quit Date: 2014  Visit Components: Discussion included one or more decision making aids. yes Discussion included risk/benefits of screening. yes Discussion included potential follow up diagnostic testing for abnormal scans. yes Discussion included meaning and risk of over diagnosis. yes Discussion included meaning and risk of False Positives. yes Discussion included meaning of total radiation exposure. yes  Counseling Included: Importance of adherence to annual lung cancer LDCT screening. yes Impact of comorbidities on ability to participate in the program. yes Ability and willingness to under diagnostic treatment. yes  Smoking Cessation Counseling: Former Smokers:  Discussed the importance of maintaining cigarette abstinence. yes Diagnosis Code: Personal History of Nicotine Dependence. J88.416 Information  about tobacco cessation classes and interventions provided to patient. Yes Patient provided with "ticket" for LDCT Scan. N/a Written Order for Lung Cancer Screening with LDCT placed in Epic. Yes (CT Chest Lung Cancer Screening Low Dose W/O CM) [id number] Z12.2-Screening of respiratory organs Z87.891-Personal history of nicotine dependence   I spent 25 minutes of face to face time/virtual visit time  with the patient discussing the risks and benefits of lung cancer screening. We took the time to pause the power point at intervals to allow for questions to be asked and answered to ensure understanding. We discussed that she had taken the single most powerful action possible to decrease her risk of developing lung cancer when she quit smoking. I counseled her to remain smoke free, and to contact the office if she ever had the desire to smoke again so that I can provide resources and tools to help support the effort to remain smoke free. We discussed the time and location of the scan, and that either  Abigail Miyamoto RN, Karlton Lemon, RN or I  or I will call / send a letter with the results within  24-72 hours of receiving them. She has the office contact information in the event she has questions,  She verbalized understanding of all of the above and had no further questions upon leaving the office.     I explained to the patient that there has been a high incidence of coronary artery disease noted on these exams. I explained that this is a non-gated exam therefore degree or severity cannot be determined. This patient is not on statin therapy. I have asked the patient to follow-up with their PCP regarding any incidental finding of coronary artery disease and management with diet or medication as they feel  is clinically indicated. The patient verbalized understanding of the above and had no further questions.      Darcella Gasman Lineth Thielke, PA-C

## 2022-10-08 ENCOUNTER — Other Ambulatory Visit: Payer: Self-pay | Admitting: Acute Care

## 2022-10-08 DIAGNOSIS — Z87891 Personal history of nicotine dependence: Secondary | ICD-10-CM

## 2022-10-08 DIAGNOSIS — Z122 Encounter for screening for malignant neoplasm of respiratory organs: Secondary | ICD-10-CM

## 2022-10-11 ENCOUNTER — Encounter: Payer: Self-pay | Admitting: Cardiology

## 2022-10-11 ENCOUNTER — Telehealth: Payer: Self-pay | Admitting: Acute Care

## 2022-10-11 NOTE — Telephone Encounter (Signed)
From patient.

## 2022-10-11 NOTE — Telephone Encounter (Signed)
PT is part of the Lung Cancer Screen and wants to speak to the person who "read" her last test results from that because she needs more clarity. Please call @ [phone]

## 2022-10-15 NOTE — Telephone Encounter (Signed)
Called and spoke to pt. Pt had questions regarding emphysema seen on scan. Pt also questioned if she could do pulmonary rehab. Spoke with pt regarding emphysema and the reason for "subtle changes" mentioned on the impression. Pt verbalized understanding. Also advised pt to speak with Dr. Celine Mans at her upcoming consult visit regarding pulmonary rehab. Pt verbalized understanding and denied any further questions or concerns at this time.

## 2022-10-23 ENCOUNTER — Encounter: Payer: Self-pay | Admitting: Internal Medicine

## 2022-10-23 ENCOUNTER — Ambulatory Visit (INDEPENDENT_AMBULATORY_CARE_PROVIDER_SITE_OTHER): Payer: 59 | Admitting: Internal Medicine

## 2022-10-23 VITALS — BP 122/70 | HR 74 | Temp 97.9°F | Ht 64.0 in | Wt 115.0 lb

## 2022-10-23 DIAGNOSIS — I700 Atherosclerosis of aorta: Secondary | ICD-10-CM | POA: Insufficient documentation

## 2022-10-23 DIAGNOSIS — E782 Mixed hyperlipidemia: Secondary | ICD-10-CM | POA: Diagnosis not present

## 2022-10-23 DIAGNOSIS — J438 Other emphysema: Secondary | ICD-10-CM

## 2022-10-23 NOTE — Progress Notes (Unsigned)
   Subjective:  Patient ID: Katherine Cruz, female    DOB: August 15, 1965  Age: 57 y.o. MRN: [id number]  CC: No chief complaint on file.   HPI Katherine Cruz presents for ***  No outpatient medications prior to visit.   No facility-administered medications prior to visit.    ROS Review of Systems  Objective:  BP 122/70 (BP Location: Left Arm, Patient Position: Sitting, Cuff Size: Normal)   Pulse 74   Temp 97.9 F (36.6 C) (Oral)   Ht 5\' 4"  (1.626 m)   Wt 115 lb (52.2 kg)   LMP 03/24/2015 (Approximate)   SpO2 98%   BMI 19.74 kg/m   BP Readings from Last 3 Encounters:  10/23/22 122/70  09/05/22 118/68  11/02/21 104/64    Wt Readings from Last 3 Encounters:  10/23/22 115 lb (52.2 kg)  09/05/22 114 lb (51.7 kg)  08/06/22 117 lb (53.1 kg)    Physical Exam  Lab Results  Component Value Date   WBC 4.5 09/05/2022   HGB 13.7 09/05/2022   HCT 40.6 09/05/2022   PLT 210.0 09/05/2022   GLUCOSE 92 09/05/2022   CHOL 213 (H) 12/03/2021   TRIG 50 12/03/2021   HDL 77 12/03/2021   LDLDIRECT 133 (H) 12/03/2021   LDLCALC 127 (H) 12/03/2021   ALT 20 09/05/2022   AST 20 09/05/2022   NA 140 09/05/2022   K 4.1 09/05/2022   CL 102 09/05/2022   CREATININE 0.65 09/05/2022   BUN 17 09/05/2022   CO2 33 (H) 09/05/2022   TSH 3.51 09/05/2022    CT CHEST LUNG CA SCREEN LOW DOSE W/O CM  Result Date: 10/08/2022 CLINICAL DATA:  57 year old female with 33 pack-year history of smoking. Lung cancer screening. EXAM: CT CHEST WITHOUT CONTRAST LOW-DOSE FOR LUNG CANCER SCREENING TECHNIQUE: Multidetector CT imaging of the chest was performed following the standard protocol without IV contrast. RADIATION DOSE REDUCTION: This exam was performed according to the departmental dose-optimization program which includes automated exposure control, adjustment of the mA and/or kV according to patient size and/or use of iterative reconstruction technique. COMPARISON:  Standard CT chest 01/16/2018 FINDINGS:  Cardiovascular: The heart size is normal. No substantial pericardial effusion. Mild atherosclerotic calcification is noted in the wall of the thoracic aorta. Mediastinum/Nodes: No mediastinal lymphadenopathy. No evidence for gross hilar lymphadenopathy although assessment is limited by the lack of intravenous contrast on the current study. The esophagus has normal imaging features. There is no axillary lymphadenopathy. Lungs/Pleura: Subtle changes of centrilobular and paraseptal emphysema noted. No suspicious pulmonary nodule or mass although a tiny right lower lobe pulmonary nodule is identified measuring 2.3 mm. No focal airspace consolidation. No pleural effusion. Upper Abdomen: Visualized portion of the upper abdomen is unremarkable. Musculoskeletal: No worrisome lytic or sclerotic osseous abnormality. IMPRESSION: 1. Lung-RADS 2, benign appearance or behavior. Continue annual screening with low-dose chest CT without contrast in 12 months. 2. Aortic Atherosclerosis (ICD10-I70.0) and Emphysema (ICD10-J43.9). Electronically Signed   By: Kennith Center M.D.   On: 10/08/2022 11:59   Assessment & Plan:  There are no diagnoses linked to this encounter.   Follow-up: No follow-ups on file.  Sanda Linger, MD

## 2022-10-23 NOTE — Patient Instructions (Signed)
Chronic Obstructive Pulmonary Disease  Chronic obstructive pulmonary disease (COPD) is a long-term (chronic) condition that affects the lungs. COPD is a general term that can be used to describe many different lung problems that cause lung inflammation and limit airflow, including chronic bronchitis and emphysema. If you have COPD, your lung function will probably never return to normal. In most cases, it gets worse over time. However, there are steps you can take to slow the progression of the disease and improve your quality of life. What are the causes? This condition may be caused by: Smoking. This is the most common cause. Certain genes passed down through families. What increases the risk? The following factors may make you more likely to develop this condition: Being exposed to secondhand smoke from cigarettes, pipes, or cigars. Being exposed to chemicals and other irritants, such as fumes and dust in the work environment. Having chronic lung conditions or infections. What are the signs or symptoms? Symptoms of this condition include: Shortness of breath, especially during physical activity. Chronic cough with a large amount of thick mucus. Sometimes, the cough may not have any mucus (dry cough). Wheezing and rapid breathing. Gray or bluish discoloration (cyanosis) of the skin, especially in the fingers, toes, or lips. Feeling tired (fatigue). Weight loss. Chest tightness. Frequent infections. Episodes when breathing symptoms become much worse (exacerbations). At the later stages of this disease, you may have swelling in the ankles, feet, or legs. How is this diagnosed? This condition is diagnosed based on: Your medical history. A physical exam. You may also have tests, including: Lung (pulmonary) function tests. This may include a spirometry test, which measures your ability to exhale properly. Chest X-ray. CT scan. Blood tests. How is this treated? This condition may be  treated with: Medicines. These may include inhaled rescue medicines to treat acute exacerbations as well as medicines that you take long-term (maintenance medicines) to prevent flare-ups of COPD. Bronchodilators help treat COPD by dilating the airways to allow increased airflow and make your breathing more comfortable. Steroids can reduce airway inflammation and help prevent exacerbations. Smoking cessation. If you smoke, your health care provider may ask you to quit, and may also recommend therapy or replacement products to help you quit. Pulmonary rehabilitation. This may involve working with a team of health care providers and specialists, such as respiratory, occupational, and physical therapists. Exercise and physical activity. These are beneficial for nearly all people with COPD. Nutrition therapy to gain weight, if you are underweight. Oxygen. Supplemental oxygen therapy is only helpful if you have a low oxygen level in your blood (hypoxemia). Lung surgery or transplant. Palliative care. This is to help people with COPD feel comfortable when treatment is no longer working. Follow these instructions at home: Medicines Take over-the-counter and prescription medicines only as told by your health care provider. This includes inhaled medicines and pills. Talk to your health care provider before taking any cough or allergy medicines. You may need to avoid certain medicines that dry out your airways. Lifestyle If you smoke, the most important thing that you can do is to stop smoking. Continuing to smoke will cause the disease to progress faster. Do not use any products that contain nicotine or tobacco. These products include cigarettes, chewing tobacco, and vaping devices, such as e-cigarettes. If you need help quitting, ask your health care provider. Avoid exposure to things that irritate your lungs, such as smoke, chemicals, and fumes. Stay active, but balance activity with periods of rest.  Exercise and   physical activity will help you maintain your ability to do things you want to do. Learn and use relaxation techniques to manage stress and to control your breathing. Get the right amount of sleep and get quality sleep. Most adults need 7 or more hours per night. Eat healthy foods. Eating smaller, more frequent meals and resting before meals may help you maintain your strength. Controlled breathing Learn and use controlled breathing techniques as directed by your health care provider. Controlled breathing techniques include: Pursed lip breathing. Start by breathing in (inhaling) through your nose for 1 second. Then, purse your lips as if you were going to whistle and breathe out (exhale) through the pursed lips for 2 seconds. Diaphragmatic breathing. Start by putting one hand on your abdomen just above your waist. Inhale slowly through your nose. The hand on your abdomen should move out. Then purse your lips and exhale slowly. You should be able to feel the hand on your abdomen moving in as you exhale.  Controlled coughing Learn and use controlled coughing to clear mucus from your lungs. Controlled coughing is a series of short, progressive coughs. The steps of controlled coughing are: Lean your head slightly forward. Breathe in deeply using diaphragmatic breathing. Try to hold your breath for 3 seconds. Keep your mouth slightly open while coughing twice. Spit any mucus out into a tissue. Rest and repeat the steps once or twice as needed. General instructions Make sure you receive all the vaccines that your health care provider recommends, especially the pneumococcal and influenza vaccines. Preventing infection and hospitalization is very important when you have COPD. Drink enough fluid to keep your urine pale yellow, unless you have a medical condition that requires fluid restriction. Use oxygen therapy and pulmonary rehabilitation if told by your health care provider. If you  require home oxygen therapy, ask your health care provider whether you should purchase a pulse oximeter to measure your oxygen level at home. Work with your health care provider to develop a COPD action plan. This will help you know what steps to take if your condition gets worse. Keep other chronic health conditions under control as told by your health care provider. Avoid extreme temperature and humidity changes. Avoid contact with people who have an illness that spreads from person to person (is contagious), such as viral infections or pneumonia. Keep all follow-up visits. This is important. Contact a health care provider if: You are coughing up more mucus than usual. There is a change in the color or thickness of your mucus. Your breathing is more labored than usual. Your breathing is faster than usual. You have difficulty sleeping. You need to use your rescue medicines or inhalers more often than expected. You have trouble doing routine activities such as getting dressed or walking around the house. Get help right away if: You have shortness of breath while you are resting. You have shortness of breath that prevents you from: Being able to talk. Performing your usual physical activities. You have chest pain lasting longer than 5 minutes. Your skin color is more blue (cyanotic) than usual. You measure low oxygen saturations for longer than 5 minutes with a pulse oximeter. You have a fever. You feel too tired to breathe normally. These symptoms may represent a serious problem that is an emergency. Do not wait to see if the symptoms will go away. Get medical help right away. Call your local emergency services (911 in the [place] Do not drive yourself to the hospital. Summary Chronic obstructive   pulmonary disease (COPD) is a long-term (chronic) condition that affects the lungs. Your lung function will probably never return to normal. In most cases, it gets worse over time. However, there  are steps you can take to slow the progression of the disease and improve your quality of life. Treatment for COPD may include taking medicines, quitting smoking, pulmonary rehabilitation, and changes to diet and exercise. As the disease progresses, you may need oxygen therapy, a lung transplant, or palliative care. To help manage your condition, do not smoke, avoid exposure to things that irritate your lungs, stay up to date on all vaccines, and follow your health care provider's instructions for taking medicines. This information is not intended to replace advice given to you by your health care provider. Make sure you discuss any questions you have with your health care provider. Document Revised: 03/13/2020 Document Reviewed: 03/14/2020 Elsevier Patient Education  2024 [employer]  

## 2022-10-24 LAB — BASIC METABOLIC PANEL
BUN: 15 mg/dL (ref 6–23)
CO2: 26 mEq/L (ref 19–32)
Calcium: 9.3 mg/dL (ref 8.4–10.5)
Chloride: 102 mEq/L (ref 96–112)
Creatinine, Ser: 0.65 mg/dL (ref 0.40–1.20)
GFR: 97.32 mL/min (ref >60.00)
Glucose, Bld: 96 mg/dL (ref 70–99)
Potassium: 4.3 mEq/L (ref 3.5–5.1)
Sodium: 141 mEq/L (ref 135–145)

## 2022-10-24 LAB — LIPID PANEL
Cholesterol: 209 mg/dL — ABNORMAL HIGH (ref 0–200)
HDL: 75.4 mg/dL (ref >39.00)
LDL Cholesterol: 116 mg/dL — ABNORMAL HIGH (ref 0–99)
NonHDL: 133.71
Total CHOL/HDL Ratio: 3
Triglycerides: 91 mg/dL (ref 0.0–149.0)
VLDL: 18.2 mg/dL (ref 0.0–40.0)

## 2022-10-30 ENCOUNTER — Other Ambulatory Visit: Payer: Self-pay | Admitting: Internal Medicine

## 2022-10-30 ENCOUNTER — Institutional Professional Consult (permissible substitution): Payer: 59 | Admitting: Internal Medicine

## 2022-10-30 DIAGNOSIS — N644 Mastodynia: Secondary | ICD-10-CM

## 2022-10-31 ENCOUNTER — Ambulatory Visit: Payer: 59

## 2022-10-31 ENCOUNTER — Ambulatory Visit
Admission: RE | Admit: 2022-10-31 | Discharge: 2022-10-31 | Disposition: A | Discharge status: Home / Self Care | Payer: 59 | Source: Ambulatory Visit | Attending: Internal Medicine | Admitting: Internal Medicine

## 2022-10-31 DIAGNOSIS — N644 Mastodynia: Secondary | ICD-10-CM

## 2022-11-01 ENCOUNTER — Encounter: Payer: Self-pay | Admitting: Cardiology

## 2022-11-01 ENCOUNTER — Ambulatory Visit: Payer: 59 | Admitting: Cardiology

## 2022-11-01 ENCOUNTER — Ambulatory Visit: Payer: 59 | Admitting: Student

## 2022-11-01 ENCOUNTER — Ambulatory Visit: Payer: 59

## 2022-11-01 VITALS — BP 115/42 | HR 66 | Ht 64.0 in | Wt 116.0 lb

## 2022-11-01 DIAGNOSIS — I700 Atherosclerosis of aorta: Secondary | ICD-10-CM

## 2022-11-01 DIAGNOSIS — E782 Mixed hyperlipidemia: Secondary | ICD-10-CM

## 2022-11-01 NOTE — Progress Notes (Signed)
   Follow up visit  Subjective:   Katherine Cruz, female    DOB: 1965-06-23, 57 y.o.   MRN: [id number]  HPI  Chief Complaint  Patient presents with   Hyperlipidemia   Follow-up    57 y.o.  female, former smoker with mild emphysema, with hyperlipidemia, mild MR, TR, fibromyalgia, aortic atherosclerosis  Patient is vegan, stays active with regular gardening etc. Recently, she was found to have aortic atherosclerosis on CT chest. In 2019, calcium score was 0. ABI iwas normal in 2022. Patient denies chest pain, shortness of breath, palpitations, leg edema, orthopnea, PND, TIA/syncope. AS far as possible,m she would like to avoid being on any medications.   No current outpatient medications on file.    Cardiovascular & other pertient studies:  Reviewed external labs and tests, independently interpreted   EKG 09/05/2022: Sinus rhythm Normal EKG  Echocardiogram 07/28/2020:  Normal LV systolic function with visual EF 55-60%. Left ventricle cavity  is normal in size. Normal global wall motion. Normal diastolic filling  pattern, normal LAP.  Mild (Grade I) mitral regurgitation.  Mild tricuspid regurgitation. No evidence of pulmonary hypertension.  IVC is dilated with a respiratory response of <50%.  Compared to prior study dated 12/28/2014: No significant change.   CT cardiac scoring 12/2027: Calcium score 0  Recent labs: 10/23/2022: Glucose 96, BUN/Cr 15/0.65. EGFR 97. Na/K 141/4.3. Rest of the CMP normal H/H 13/40. MCV 92. Platelets 210 HbA1C NA Chol 209, TG 91, HDL 75, LDL 116 TSH 3.5 normal    Review of Systems  Cardiovascular:  Negative for chest pain, dyspnea on exertion, leg swelling, palpitations and syncope.         Vitals:   11/01/22 0958  BP: (!) 115/42  Pulse: 66  SpO2: 99%    Body mass index is 19.91 kg/m. Filed Weights   11/01/22 0958  Weight: 116 lb (52.6 kg)     Objective:   Physical Exam Vitals and nursing note reviewed.  Constitutional:       General: She is not in acute distress. Neck:     Vascular: No JVD.  Cardiovascular:     Rate and Rhythm: Normal rate and regular rhythm.     Heart sounds: Normal heart sounds. No murmur heard. Pulmonary:     Effort: Pulmonary effort is normal.     Breath sounds: Normal breath sounds. No wheezing or rales.  Musculoskeletal:     Right lower leg: No edema.     Left lower leg: No edema.             Visit diagnoses:   ICD-10-CM   1. Mixed hyperlipidemia  E78.2 CT CARDIAC SCORING (SELF PAY ONLY)    2. Aortic atherosclerosis (HCC)  I70.0        Orders Placed This Encounter  Procedures   CT CARDIAC SCORING (SELF PAY ONLY)     Assessment & Recommendations:   57 y.o. female, former smoker with mild emphysema, with hyperlipidemia, mild MR, TR, fibromyalgia, aortic atherosclerosis  Aortic atherosclerosis: Incidental finding. No symptoms os ASCVD. LDL albeit slightly high for a vegan patient. She soul like to avoid statin in possible. We will repeat calcium score, last checked in 2019. If 0, will hold statin. If elevated, will consider statin.  Mild MR, TR:   F/u in 1 year     Elder Negus, MD Pager: [phone] Office: [phone]

## 2022-11-05 ENCOUNTER — Ambulatory Visit: Payer: 59 | Admitting: Neurology

## 2022-11-05 NOTE — Progress Notes (Unsigned)
Assessment/Plan:   1.  Tremor, by history  -I saw no evidence of tremor on her examination today.  She does have a family history of essential tremor in her mother and Parkinson's disease in maternal grandmother, but did not see any evidence of either in her today.  -I do wonder if the primary issue is anxiety induced tremor.  She and I discussed this today.  If so, propranolol still could be quite helpful, especially if she has the issue in her drawing class.  She really would like to avoid medication.  -She and I discussed weighted gloves and weighted pens/pencils/utensils.  We also discussed occupational therapy devices such as silly putty.  2.  Word finding difficulty  -I do not suspect evidence of a neurodegenerative process.  -She asked me about neurocognitive testing.  I told her that we do not do this in our practice under the age of 49.  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    -Her neuroexam is nonfocal and nonlateralizing.  -Offered MRI brain.  She wanted to think about it.   Subjective:   Katherine Cruz was seen in consultation in the movement disorder clinic at the request of Etta Grandchild, MD. patient is a 57 year old female with a history of anxiety disorder,, reflux, hyperlipidemia, depression, fibromyalgia who presents for evaluation of tremor.  Tremor started more than 5 years ago.  She remembers when she was young she would shake when she was nervous.  She started taking drawing classes recently and she noted more trouble with drawing and the control on the hand was more difficult.  It involves the bilateral.  Tremor is most noticeable when nervous.   There is a family hx of tremor of essential tremor in mom and Parkinsons Disease in maternal GM.  Separately, she c/o "jerking movements" in the arms bilaterally.  They are independent jerks (L and R).  Unknown what brings out a jerk.    Affected by caffeine:  No. (2 cups coffee) Affected by alcohol:   doesn't drink any Affected by stress:  Yes.   Affected by fatigue:  Yes.   (Shakes less) Spills soup if on spoon:  No. Spills glass of liquid if full:  No., "but I have to make an effort to keep it in there." Affects ADL's (tying shoes, brushing teeth, etc):  No.  Current/Previously tried tremor medications: n/a  Current medications that may exacerbate tremor:  None  Notes some trouble with word finding.  She is fluent in 3 languages but has difficulty going from 1 language to another currently.  Outside reports reviewed: lab reports, office notes, and referral letter/letters.  Allergies  Allergen Reactions   Latex Itching   Nickel Rash    No outpatient medications have been marked as taking for the 11/07/22 encounter (Office Visit) with Tenecia Ignasiak, Octaviano Batty, DO.      Objective:   VITALS:   Vitals:   11/07/22 1318  BP: 116/78  Pulse: 68  SpO2: 98%  Weight: 116 lb 3.2 oz (52.7 kg)  Height: 5\' 4"  (1.626 m)   Gen:  Appears stated age and in NAD.  She is anxious appearing.   HEENT:  Normocephalic, atraumatic. The mucous membranes are moist. The superficial temporal arteries are without ropiness or tenderness. Cardiovascular: Regular rate and rhythm. Lungs: Clear to auscultation bilaterally. Neck: There are no carotid bruits noted bilaterally.  NEUROLOGICAL:  Orientation:  The patient is alert and oriented x 3.   Cranial  nerves: There is good facial symmetry. Extraocular muscles are intact and visual fields are full to confrontational testing. Speech is fluent and clear. Soft palate rises symmetrically and there is no tongue deviation. Hearing is intact to conversational tone. Tone: Tone is good throughout. Sensation: Sensation is intact to light touch touch throughout (facial, trunk, extremities). Vibration is intact at the bilateral big toe. There is no extinction with double simultaneous stimulation. There is no sensory dermatomal level identified. Coordination:  The patient  has no dysdiadichokinesia or dysmetria. Motor: Strength is 5/5 in the bilateral upper and lower extremities.  Shoulder shrug is equal bilaterally.  There is no pronator drift.  There are no fasciculations noted. DTR's: Deep tendon reflexes are 2/4 at the bilateral biceps, triceps, brachioradialis, patella and achilles.  Plantar responses are downgoing bilaterally. Gait and Station: The patient is able to ambulate without difficulty. The patient is able to ambulate in a tandem fashion. The patient is able to stand in the Romberg position.   MOVEMENT EXAM Tremor:  There is no rest tremor.  There is no postural tremor.  There is no intention tremor.  There is no tremor with Archimedes spirals.  She is able to pour water from 1 glass to another without spilling it.  I have reviewed and interpreted the following labs independently   Chemistry      Component Value Date/Time   NA 141 10/23/2022 1504   K 4.3 10/23/2022 1504   CL 102 10/23/2022 1504   CO2 26 10/23/2022 1504   BUN 15 10/23/2022 1504   CREATININE 0.65 10/23/2022 1504      Component Value Date/Time   CALCIUM 9.3 10/23/2022 1504   ALKPHOS 61 09/05/2022 1345   AST 20 09/05/2022 1345   ALT 20 09/05/2022 1345   BILITOT 0.6 09/05/2022 1345      Lab Results  Component Value Date   WBC 4.5 09/05/2022   HGB 13.7 09/05/2022   HCT 40.6 09/05/2022   MCV 92.9 09/05/2022   PLT 210.0 09/05/2022   Lab Results  Component Value Date   TSH 3.51 09/05/2022      Total time spent on today's visit was 45 minutes, including both face-to-face time and nonface-to-face time.  Time included that spent on review of records (prior notes available to me/labs/imaging if pertinent), discussing treatment and goals, answering patient's questions and coordinating care.  CC:  Etta Grandchild, MD

## 2022-11-07 ENCOUNTER — Encounter: Payer: Self-pay | Admitting: Neurology

## 2022-11-07 ENCOUNTER — Ambulatory Visit (INDEPENDENT_AMBULATORY_CARE_PROVIDER_SITE_OTHER): Payer: 59 | Admitting: Neurology

## 2022-11-07 ENCOUNTER — Ambulatory Visit (HOSPITAL_COMMUNITY): Payer: 59

## 2022-11-07 VITALS — BP 116/78 | HR 68 | Ht 64.0 in | Wt 116.2 lb

## 2022-11-07 DIAGNOSIS — F411 Generalized anxiety disorder: Secondary | ICD-10-CM | POA: Diagnosis not present

## 2022-11-07 DIAGNOSIS — R251 Tremor, unspecified: Secondary | ICD-10-CM

## 2022-11-07 NOTE — Patient Instructions (Signed)
We discussed :  Weighted gloves such as the one on [employer] called [employer] Weighted pens Crazy aarons putty A little propranolol before you go to class  We discussed that you likely have memory change associated anxiety.  However, we also discussed that we don't do formal cognitive testing in this young age group in our practice.  We did, however, discuss doing an MRI brain and you wanted to think about that.  You can let us know what you decide.  The physicians and staff at [facility] are committed to providing excellent care. You may receive a survey requesting feedback about your experience at our office. We strive to receive "very good" responses to the survey questions. If you feel that your experience would prevent you from giving the office a "very good " response, please contact our office to try to remedy the situation. We may be reached at [phone]. Thank you for taking the time out of your busy day to complete the survey.

## 2022-11-15 ENCOUNTER — Ambulatory Visit: Payer: 59 | Admitting: Nurse Practitioner

## 2022-11-20 ENCOUNTER — Ambulatory Visit (HOSPITAL_COMMUNITY)
Admission: RE | Admit: 2022-11-20 | Discharge: 2022-11-20 | Disposition: A | Discharge status: Home / Self Care | Payer: 59 | Source: Ambulatory Visit | Attending: Cardiology | Admitting: Cardiology

## 2022-11-20 DIAGNOSIS — E782 Mixed hyperlipidemia: Secondary | ICD-10-CM | POA: Insufficient documentation

## 2022-12-17 ENCOUNTER — Institutional Professional Consult (permissible substitution): Payer: 59 | Admitting: Internal Medicine

## 2023-01-16 ENCOUNTER — Encounter: Payer: Self-pay | Admitting: Cardiology

## 2023-01-16 ENCOUNTER — Ambulatory Visit: Payer: 59 | Admitting: Cardiology

## 2023-01-16 VITALS — BP 109/60 | HR 78 | Resp 16 | Ht 64.0 in | Wt 117.0 lb

## 2023-01-16 DIAGNOSIS — I700 Atherosclerosis of aorta: Secondary | ICD-10-CM

## 2023-01-16 DIAGNOSIS — E782 Mixed hyperlipidemia: Secondary | ICD-10-CM

## 2023-01-16 NOTE — Progress Notes (Signed)
   Follow up visit  Subjective:   Katherine Cruz, female    DOB: 01/14/1966, 57 y.o.   MRN: [id number]  HPI  Chief Complaint  Patient presents with   Hyperlipidemia   Follow-up    57 y.o.  female, former smoker with mild emphysema, with hyperlipidemia, mild MR, TR, fibromyalgia, aortic atherosclerosis  Patient is doing well. denies chest pain, shortness of breath, palpitations, leg edema, orthopnea, PND, TIA/syncope. Reviewed recent test results with the patient, details below.    No current outpatient medications on file.    Cardiovascular & other pertient studies:  Reviewed external labs and tests, independently interpreted   EKG 09/05/2022: Sinus rhythm Normal EKG  CT cardiac scoring 11/20/2022: 1. Coronary calcium score of 0. This was 1st percentile for age, gender, and race matched controls. 2. Aortic atherosclerosis.   Echocardiogram 07/28/2020:  Normal LV systolic function with visual EF 55-60%. Left ventricle cavity  is normal in size. Normal global wall motion. Normal diastolic filling  pattern, normal LAP.  Mild (Grade I) mitral regurgitation.  Mild tricuspid regurgitation. No evidence of pulmonary hypertension.  IVC is dilated with a respiratory response of <50%.  Compared to prior study dated 12/28/2014: No significant change.    Recent labs: 10/23/2022: Glucose 96, BUN/Cr 15/0.65. EGFR 97. Na/K 141/4.3. Rest of the CMP normal H/H 13/40. MCV 92. Platelets 210 HbA1C NA Chol 209, TG 91, HDL 75, LDL 116 TSH 3.5 normal    Review of Systems  Cardiovascular:  Negative for chest pain, dyspnea on exertion, leg swelling, palpitations and syncope.         Vitals:   01/16/23 1257  BP: 109/60  Pulse: 78  Resp: 16  SpO2: 99%     Body mass index is 20.08 kg/m. Filed Weights   01/16/23 1257  Weight: 117 lb (53.1 kg)      Objective:   Physical Exam Vitals and nursing note reviewed.  Constitutional:      General: She is not in acute  distress. Neck:     Vascular: No JVD.  Cardiovascular:     Rate and Rhythm: Normal rate and regular rhythm.     Heart sounds: Normal heart sounds. No murmur heard. Pulmonary:     Effort: Pulmonary effort is normal.     Breath sounds: Normal breath sounds. No wheezing or rales.  Musculoskeletal:     Right lower leg: No edema.     Left lower leg: No edema.             Visit diagnoses:   ICD-10-CM   1. Aortic atherosclerosis (HCC)  I70.0     2. Mixed hyperlipidemia  E78.2           Assessment & Recommendations:   57 y.o. female, former smoker with mild emphysema, with hyperlipidemia, mild MR, TR, fibromyalgia, aortic atherosclerosis  Aortic atherosclerosis: Incidental finding. No symptoms os ASCVD. LDL albeit slightly high for a vegan patient. Calcium score 0.  She would like to avoid statin in possible.  Reasonable to hold both Aspirin and statin.  F/u in 1 year     Elder Negus, MD Pager: [phone] Office: [phone]

## 2023-01-29 ENCOUNTER — Other Ambulatory Visit: Payer: Self-pay

## 2023-01-29 DIAGNOSIS — I341 Nonrheumatic mitral (valve) prolapse: Secondary | ICD-10-CM

## 2023-01-29 DIAGNOSIS — I700 Atherosclerosis of aorta: Secondary | ICD-10-CM

## 2023-02-10 ENCOUNTER — Institutional Professional Consult (permissible substitution): Payer: 59 | Admitting: Pulmonary Disease

## 2023-02-13 ENCOUNTER — Encounter: Payer: 59 | Admitting: Obstetrics and Gynecology

## 2023-02-14 ENCOUNTER — Ambulatory Visit: Payer: 59 | Admitting: Gastroenterology

## 2023-03-20 ENCOUNTER — Telehealth: Payer: Self-pay | Admitting: Internal Medicine

## 2023-03-20 NOTE — Telephone Encounter (Signed)
Patient called and said she had spoken with Dr. Yetta Barre about a referral for a screening for ADHD and autism. She said he already sent in the referral, but I don't see it in her chart. She said she would like a copy of the referral sent to her as well. Patient would like a call back at [phone].

## 2023-03-20 NOTE — Telephone Encounter (Signed)
Patient doesn't need her Referral. I called the office where the referral was sent and they do have her referral. I advised the patient they do have her referral and they will give her a call to get her appointment scheduled. It may take them some time because they are backed up. Patient gave a verbal understanding.

## 2023-04-04 ENCOUNTER — Ambulatory Visit (INDEPENDENT_AMBULATORY_CARE_PROVIDER_SITE_OTHER): Payer: 59 | Admitting: Nurse Practitioner

## 2023-04-04 VITALS — BP 106/70 | HR 68 | Temp 97.9°F

## 2023-04-04 DIAGNOSIS — J029 Acute pharyngitis, unspecified: Secondary | ICD-10-CM | POA: Insufficient documentation

## 2023-04-04 LAB — POCT INFLUENZA A/B
Influenza A, POC: NEGATIVE
Influenza B, POC: NEGATIVE

## 2023-04-04 LAB — POC COVID19 BINAXNOW: SARS Coronavirus 2 Ag: NEGATIVE

## 2023-04-04 LAB — POCT RESPIRATORY SYNCYTIAL VIRUS: RSV Rapid Ag: NEGATIVE

## 2023-04-04 LAB — POCT RAPID STREP A (OFFICE): Rapid Strep A Screen: NEGATIVE

## 2023-04-04 MED ORDER — AMOXICILLIN-POT CLAVULANATE 875-125 MG PO TABS
1.0000 | ORAL_TABLET | Freq: Two times a day (BID) | ORAL | 0 refills | Status: DC
Start: 2023-04-04 — End: 2023-05-02

## 2023-04-04 NOTE — Progress Notes (Signed)
Established Patient Office Visit  Subjective   Patient ID: Katherine Cruz, female    DOB: 05/13/1966  Age: 57 y.o. MRN: [id number]  Chief Complaint  Patient presents with   Sore Throat    Have it about three days, sore throat, no cough, headache    Patient has today for acute visit for the above.  Symptom onset 3 days ago.  Reports she will often get sore throats with seasonal changes.  This 1 seems a bit more severe than her normal.  No chills, fever, cough, shortness of breath, wheezing.     Review of Systems  Constitutional:  Negative for chills and fever.  HENT:  Positive for sore throat.   Respiratory:  Negative for cough, shortness of breath and wheezing.   Cardiovascular:  Negative for chest pain.  Neurological:  Negative for dizziness and headaches.      Objective:     BP 106/70   Pulse 68   Temp 97.9 F (36.6 C) (Temporal)   LMP 03/24/2015 (Approximate)   SpO2 100%    Physical Exam Vitals reviewed.  Constitutional:      General: She is not in acute distress.    Appearance: Normal appearance.  HENT:     Head: Normocephalic and atraumatic.     Mouth/Throat:     Pharynx: Pharyngeal swelling and posterior oropharyngeal erythema present. No oropharyngeal exudate.     Tonsils: No tonsillar exudate.  Cardiovascular:     Rate and Rhythm: Normal rate and regular rhythm.     Pulses: Normal pulses.     Heart sounds: Normal heart sounds.  Pulmonary:     Effort: Pulmonary effort is normal.     Breath sounds: Normal breath sounds.  Lymphadenopathy:     Cervical: No cervical adenopathy.  Skin:    General: Skin is warm and dry.  Neurological:     General: No focal deficit present.     Mental Status: She is alert and oriented to person, place, and time.  Psychiatric:        Mood and Affect: Mood normal.        Behavior: Behavior normal.        Judgment: Judgment normal.      Results for orders placed or performed in visit on 04/04/23  POC COVID-19  BinaxNow  Result Value Ref Range   SARS Coronavirus 2 Ag Negative Negative  POCT rapid strep A  Result Value Ref Range   Rapid Strep A Screen Negative Negative  POCT Influenza A/B  Result Value Ref Range   Influenza A, POC Negative Negative   Influenza B, POC Negative Negative  POCT respiratory syncytial virus  Result Value Ref Range   RSV Rapid Ag negative       The 10-year ASCVD risk score (Arnett DK, et al., 2019) is: 1.5%    Assessment & Plan:   Problem List Items Addressed This Visit       Respiratory   Pharyngitis - Primary    Acute Point-of-care strep, flu, COVID, RSV: Negative Posterior oropharyngeal erythema is beefy red, no exudate noted today.  Patient had reported white exudate previously.  Discussed treatment based on symptoms with antibiotic versus symptom management alone. Will send in prescription for Augmentin that patient can take if symptoms persist or do not improve within the next 3 to 5 days.  Patient encouraged to call her office if despite this treatment plan symptoms continue to persist.  She reports her understanding.  Relevant Medications   amoxicillin-clavulanate (AUGMENTIN) 875-125 MG tablet   Other Relevant Orders   POC COVID-19 BinaxNow (Completed)   POCT rapid strep A (Completed)   POCT Influenza A/B (Completed)   POCT respiratory syncytial virus (Completed)    Return if symptoms worsen or fail to improve.    Elenore Paddy, NP

## 2023-04-04 NOTE — Assessment & Plan Note (Addendum)
Acute Point-of-care strep, flu, COVID, RSV: Negative Posterior oropharyngeal erythema is beefy red, no exudate noted today.  Patient had reported white exudate previously.  Discussed treatment based on symptoms with antibiotic versus symptom management alone. Will send in prescription for Augmentin that patient can take if symptoms persist or do not improve within the next 3 to 5 days.  Patient encouraged to call her office if despite this treatment plan symptoms continue to persist.  She reports her understanding.

## 2023-05-01 ENCOUNTER — Institutional Professional Consult (permissible substitution): Payer: 59 | Admitting: Pulmonary Disease

## 2023-05-02 ENCOUNTER — Ambulatory Visit (INDEPENDENT_AMBULATORY_CARE_PROVIDER_SITE_OTHER): Payer: 59 | Admitting: Family Medicine

## 2023-05-02 ENCOUNTER — Encounter: Payer: Self-pay | Admitting: Family Medicine

## 2023-05-02 VITALS — BP 118/66 | HR 75 | Temp 98.1°F | Ht 64.0 in

## 2023-05-02 DIAGNOSIS — R140 Abdominal distension (gaseous): Secondary | ICD-10-CM

## 2023-05-02 DIAGNOSIS — Z789 Other specified health status: Secondary | ICD-10-CM

## 2023-05-02 DIAGNOSIS — R1032 Left lower quadrant pain: Secondary | ICD-10-CM

## 2023-05-02 DIAGNOSIS — R1904 Left lower quadrant abdominal swelling, mass and lump: Secondary | ICD-10-CM

## 2023-05-02 NOTE — Progress Notes (Unsigned)
   Acute Office Visit  Subjective:     Patient ID: Katherine Cruz, female    DOB: 06-Feb-1966, 57 y.o.   MRN: [id number]  No chief complaint on file.   HPI Patient is in today for ***  ROS Per HPI      Objective:    LMP 03/24/2015 (Approximate)    Physical Exam Vitals and nursing note reviewed.  Constitutional:      Appearance: Normal appearance. She is normal weight.  HENT:     Head: Normocephalic and atraumatic.     Right Ear: Tympanic membrane and ear canal normal.     Left Ear: Tympanic membrane and ear canal normal.     Nose: Nose normal.  Eyes:     Extraocular Movements: Extraocular movements intact.     Pupils: Pupils are equal, round, and reactive to light.  Cardiovascular:     Rate and Rhythm: Normal rate and regular rhythm.     Heart sounds: Normal heart sounds.  Pulmonary:     Effort: Pulmonary effort is normal.     Breath sounds: Normal breath sounds.  Musculoskeletal:        General: Normal range of motion.     Cervical back: Normal range of motion.  Neurological:     General: No focal deficit present.     Mental Status: She is alert and oriented to person, place, and time.  Psychiatric:        Mood and Affect: Mood normal.        Thought Content: Thought content normal.   No results found for any visits on 05/02/23.      Assessment & Plan:  ***  No orders of the defined types were placed in this encounter.   No follow-ups on file.  Moshe Cipro, FNP

## 2023-05-04 ENCOUNTER — Encounter: Payer: Self-pay | Admitting: Family Medicine

## 2023-05-04 NOTE — Patient Instructions (Signed)
We have placed a referral for you for dietician today.  Someone will be reaching out to get you scheduled.  We have placed a order for a CT of your abdomen and pelvis today.  Someone will be reaching out to get you scheduled.  Follow-up with me for new or worsening symptoms.

## 2023-05-06 ENCOUNTER — Telehealth: Payer: Self-pay | Admitting: Cardiology

## 2023-05-06 ENCOUNTER — Ambulatory Visit
Admission: RE | Admit: 2023-05-06 | Discharge: 2023-05-06 | Disposition: A | Discharge status: Home / Self Care | Payer: 59 | Source: Ambulatory Visit | Attending: Family Medicine | Admitting: Family Medicine

## 2023-05-06 DIAGNOSIS — R1032 Left lower quadrant pain: Secondary | ICD-10-CM

## 2023-05-06 NOTE — Telephone Encounter (Signed)
Patient has question about the CT she is suppose to have done today. Please advise

## 2023-05-06 NOTE — Telephone Encounter (Signed)
Left the pt a message to call the office back.   Dr. Rosemary Holms did not order the pts CT ANGIO ABDOMEN that is scheduled for today.  Looks like Moshe Cipro FNP ordered this for the pt.

## 2023-05-07 ENCOUNTER — Ambulatory Visit (INDEPENDENT_AMBULATORY_CARE_PROVIDER_SITE_OTHER): Payer: 59 | Admitting: Family Medicine

## 2023-05-07 ENCOUNTER — Encounter: Payer: Self-pay | Admitting: Family Medicine

## 2023-05-07 VITALS — BP 110/74 | HR 73 | Temp 97.6°F | Ht 64.0 in

## 2023-05-07 DIAGNOSIS — R8290 Unspecified abnormal findings in urine: Secondary | ICD-10-CM | POA: Diagnosis not present

## 2023-05-07 DIAGNOSIS — Q279 Congenital malformation of peripheral vascular system, unspecified: Secondary | ICD-10-CM

## 2023-05-07 DIAGNOSIS — K8020 Calculus of gallbladder without cholecystitis without obstruction: Secondary | ICD-10-CM

## 2023-05-07 DIAGNOSIS — J029 Acute pharyngitis, unspecified: Secondary | ICD-10-CM

## 2023-05-07 DIAGNOSIS — K1379 Other lesions of oral mucosa: Secondary | ICD-10-CM | POA: Diagnosis not present

## 2023-05-07 DIAGNOSIS — Z789 Other specified health status: Secondary | ICD-10-CM

## 2023-05-07 DIAGNOSIS — R140 Abdominal distension (gaseous): Secondary | ICD-10-CM

## 2023-05-07 DIAGNOSIS — N9489 Other specified conditions associated with female genital organs and menstrual cycle: Secondary | ICD-10-CM

## 2023-05-07 DIAGNOSIS — E782 Mixed hyperlipidemia: Secondary | ICD-10-CM

## 2023-05-07 LAB — POC URINALSYSI DIPSTICK (AUTOMATED)
Bilirubin, UA: NEGATIVE
Blood, UA: NEGATIVE
Glucose, UA: NEGATIVE
Ketones, UA: NEGATIVE
Leukocytes, UA: NEGATIVE
Nitrite, UA: NEGATIVE
Protein, UA: NEGATIVE
Spec Grav, UA: 1.01 (ref 1.010–1.025)
Urobilinogen, UA: 0.2 U/dL
pH, UA: 7.5 (ref 5.0–8.0)

## 2023-05-07 NOTE — Patient Instructions (Signed)
I recommend using salt water gargles.  Call and schedule with your dentist.  You can try treating your allergies with over-the-counter Xyzal, Claritin or Zyrtec.  You can also try treating for acid reflux with over-the-counter Pepcid.  Follow-up if you have any new or worsening symptoms.

## 2023-05-07 NOTE — Progress Notes (Signed)
Subjective:  Katherine Cruz is a 57 y.o. female who presents for recurrent sore throat and a sore on the left upper hard palate x 2-3 days. She is concerned about having yeast in her mouth.   Doing oral rinses with apple cider vinegar.   She was seen on for the same. Negative for Covid, flu, strep throat and   States she did not take Augmentin. Symptoms resolve and returned 2-3 days ago.   Mild left ear pain. Intermittent nasal congestion.   Hx of allergies.   Hx of GERD  Denies fever, chills, dizziness, headache, sinus pain. chest pain, palpitations, shortness of breath, abdominal pain, N/V/D.   C/o bitter taste in her mouth.   Also c/o abnormal odor of urine without any other urinary symptoms.    ROS as in subjective.   Objective: Vitals:   05/07/23 1410  BP: 110/74  Pulse: 73  Temp: 97.6 F (36.4 C)  SpO2: 98%    General appearance: Alert, WD/WN, no distress                             Skin: warm, no rash                           Head: no sinus tenderness                            Eyes: conjunctiva normal, corneas clear, PERRLA                            Ears: pearly TMs, external ear canals normal                          Nose: septum midline, no discharge              Mouth/throat: MMM, tongue normal, mild pharyngeal erythema.                            Neck: supple, no adenopathy, no thyromegaly, nontender                          Heart: Regular rate                         Lungs: unlabored respirations        Assessment: Acquired anomaly of oral mucosa  Malodorous urine - Plan: POCT Urinalysis Dipstick (Automated)  Pharyngitis, unspecified etiology   Plan: UA negative  She is concerned that she may have yeast in her mouth. Exam is not consistent with this. Offered to treat with oral diflucan. She declines for now. She will call to schedule with her dentist.  Recommend salt water gargles.  Hx of allergies and GERD. Discussed trying OTC Xyzal or  Claritin. She may also try OTC Pepcid.

## 2023-05-08 ENCOUNTER — Encounter: Payer: Self-pay | Admitting: Physician Assistant

## 2023-05-08 ENCOUNTER — Other Ambulatory Visit: Payer: 59

## 2023-05-08 ENCOUNTER — Ambulatory Visit: Payer: 59 | Admitting: Physician Assistant

## 2023-05-08 VITALS — BP 110/70 | HR 72 | Ht 63.0 in | Wt 119.2 lb

## 2023-05-08 DIAGNOSIS — R143 Flatulence: Secondary | ICD-10-CM | POA: Diagnosis not present

## 2023-05-08 DIAGNOSIS — K8020 Calculus of gallbladder without cholecystitis without obstruction: Secondary | ICD-10-CM

## 2023-05-08 DIAGNOSIS — K648 Other hemorrhoids: Secondary | ICD-10-CM

## 2023-05-08 DIAGNOSIS — R140 Abdominal distension (gaseous): Secondary | ICD-10-CM

## 2023-05-08 DIAGNOSIS — B370 Candidal stomatitis: Secondary | ICD-10-CM

## 2023-05-08 DIAGNOSIS — I862 Pelvic varices: Secondary | ICD-10-CM

## 2023-05-08 MED ORDER — NYSTATIN 100000 UNIT/ML MT SUSP: 5.0000 mL | Freq: Four times a day (QID) | OROMUCOSAL | 1 refills | Status: DC
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
## 2023-05-08 NOTE — Patient Instructions (Signed)
Your provider has requested that you go to the basement level for lab work before leaving today. Press "B" on the elevator. The lab is located at the first door on the left as you exit the elevator.  We have sent the following medications to your pharmacy for you to pick up at your convenience:  Nystatin - swish and swallow 5 cc's 4 times a day for 14 days.  Take IB Gard as directed.  You may get over the counter hydrocortisone suppositories at bedtime for 7 days.  You have been given a testing kit to check for small intestine bacterial overgrowth (SIBO) which is completed by a company named [facility]. Make sure to return your test in the mail using the return mailing label given to you along with the kit. The test order, your demographic and insurance information have all already been sent to the company. [facility] will collect an upfront charge of $99.74 for commercial insurance plans and $209.74 if you are paying cash. Make sure to discuss with [facility] PRIOR to having the test to see if they have gotten information from your insurance company as to how much your testing will cost out of pocket, if any. Please contact [facility] at phone number [phone] to get instructions regarding how to perform the test as our office is unable to give specific testing instructions.

## 2023-05-09 LAB — TISSUE TRANSGLUTAMINASE, IGA: (tTG) Ab, IgA: <1 U/mL

## 2023-05-09 LAB — IGA: Immunoglobulin A: 135 mg/dL (ref 47–310)

## 2023-05-09 NOTE — Progress Notes (Signed)
I agree with the assessment and plan as outlined by Ms. Esterwood. ?

## 2023-05-12 ENCOUNTER — Telehealth: Payer: Self-pay | Admitting: Physician Assistant

## 2023-05-12 ENCOUNTER — Other Ambulatory Visit: Payer: Self-pay

## 2023-05-12 DIAGNOSIS — R140 Abdominal distension (gaseous): Secondary | ICD-10-CM

## 2023-05-12 DIAGNOSIS — Q279 Congenital malformation of peripheral vascular system, unspecified: Secondary | ICD-10-CM

## 2023-05-12 DIAGNOSIS — R143 Flatulence: Secondary | ICD-10-CM

## 2023-05-12 DIAGNOSIS — N9489 Other specified conditions associated with female genital organs and menstrual cycle: Secondary | ICD-10-CM

## 2023-05-12 NOTE — Telephone Encounter (Signed)
It went to [facility] - it says they will contact patient

## 2023-05-12 NOTE — Telephone Encounter (Signed)
Verlon Au Where was the referral sent to?

## 2023-05-12 NOTE — Telephone Encounter (Signed)
Sorry - amb ref to allergy and asthma in [place].

## 2023-05-12 NOTE — Telephone Encounter (Signed)
Contacted the patient. She has scheduled with Asthma and Allergy or [place]. She is unable to see the [facility] for nutritional counseling unless she has diabetes or kidney disease. She will contact [facility] for their registered dietician.

## 2023-05-12 NOTE — Telephone Encounter (Signed)
Where did the allergist referral go to?

## 2023-05-12 NOTE — Telephone Encounter (Signed)
Patient called and stated that she has yet to be contacted by the allergist and nutritionist. Patient also stated that her referral for that was not noted in her after visit summary and was wondering on the status of those referral. Patient is requesting a call back. Please advise.

## 2023-05-20 ENCOUNTER — Encounter: Payer: Self-pay | Admitting: Internal Medicine

## 2023-05-20 ENCOUNTER — Ambulatory Visit: Payer: 59 | Admitting: Internal Medicine

## 2023-05-20 VITALS — BP 116/82 | HR 75 | Temp 98.5°F | Resp 16 | Ht 64.0 in | Wt 119.0 lb

## 2023-05-20 DIAGNOSIS — R5382 Chronic fatigue, unspecified: Secondary | ICD-10-CM

## 2023-05-20 DIAGNOSIS — J029 Acute pharyngitis, unspecified: Secondary | ICD-10-CM | POA: Diagnosis not present

## 2023-05-20 DIAGNOSIS — N9489 Other specified conditions associated with female genital organs and menstrual cycle: Secondary | ICD-10-CM | POA: Insufficient documentation

## 2023-05-20 DIAGNOSIS — E559 Vitamin D deficiency, unspecified: Secondary | ICD-10-CM

## 2023-05-20 DIAGNOSIS — R7989 Other specified abnormal findings of blood chemistry: Secondary | ICD-10-CM | POA: Diagnosis not present

## 2023-05-20 LAB — BASIC METABOLIC PANEL
BUN: 13 mg/dL (ref 6–23)
CO2: 30 meq/L (ref 19–32)
Calcium: 9.8 mg/dL (ref 8.4–10.5)
Chloride: 102 meq/L (ref 96–112)
Creatinine, Ser: 0.66 mg/dL (ref 0.40–1.20)
GFR: 96.57 mL/min (ref >60.00)
Glucose, Bld: 93 mg/dL (ref 70–99)
Potassium: 3.7 meq/L (ref 3.5–5.1)
Sodium: 141 meq/L (ref 135–145)

## 2023-05-20 LAB — CBC WITH DIFFERENTIAL/PLATELET
Basophils Absolute: 0 10*3/uL (ref 0.0–0.1)
Basophils Relative: 0.5 % (ref 0.0–3.0)
Eosinophils Absolute: 0.1 10*3/uL (ref 0.0–0.7)
Eosinophils Relative: 1.6 % (ref 0.0–5.0)
HCT: 41.8 % (ref 36.0–46.0)
Hemoglobin: 14.1 g/dL (ref 12.0–15.0)
Lymphocytes Relative: 34.6 % (ref 12.0–46.0)
Lymphs Abs: 1.5 10*3/uL (ref 0.7–4.0)
MCHC: 33.6 g/dL (ref 30.0–36.0)
MCV: 91.8 fL (ref 78.0–100.0)
Monocytes Absolute: 0.2 10*3/uL (ref 0.1–1.0)
Monocytes Relative: 4.9 % (ref 3.0–12.0)
Neutro Abs: 2.6 10*3/uL (ref 1.4–7.7)
Neutrophils Relative %: 58.4 % (ref 43.0–77.0)
Platelets: 191 10*3/uL (ref 150.0–400.0)
RBC: 4.56 Mil/uL (ref 3.87–5.11)
RDW: 14.2 % (ref 11.5–15.5)
WBC: 4.4 10*3/uL (ref 4.0–10.5)

## 2023-05-20 LAB — VITAMIN D 25 HYDROXY (VIT D DEFICIENCY, FRACTURES): VITD: 12.71 ng/mL — ABNORMAL LOW (ref 30.00–100.00)

## 2023-05-20 MED ORDER — CHOLECALCIFEROL 1.25 MG (50000 UT) PO CAPS
50000.0000 [IU] | ORAL_CAPSULE | ORAL | 0 refills | Status: DC
Start: 2023-05-20 — End: 2023-09-18

## 2023-05-20 NOTE — Patient Instructions (Signed)
 Fatigue If you have fatigue, you feel tired all the time and have a lack of energy or a lack of motivation. Fatigue may make it difficult to start or complete tasks because of exhaustion. Occasional or mild fatigue is often a normal response to activity or life. However, long-term (chronic) or extreme fatigue may be a symptom of a medical condition such as: Depression. Not having enough red blood cells or hemoglobin in the blood (anemia). A problem with a small gland located in the lower front part of the neck (thyroid disorder). Rheumatologic conditions. These are problems related to the body's defense system (immune system). Infections, especially certain viral infections. Fatigue can also lead to negative health outcomes over time. Follow these instructions at home: Medicines Take over-the-counter and prescription medicines only as told by your health care provider. Take a multivitamin if told by your health care provider. Do not use herbal or dietary supplements unless they are approved by your health care provider. Eating and drinking  Avoid heavy meals in the evening. Eat a well-balanced diet, which includes lean proteins, whole grains, plenty of fruits and vegetables, and low-fat dairy products. Avoid eating or drinking too many products with caffeine in them. Avoid alcohol. Drink enough fluid to keep your urine pale yellow. Activity  Exercise regularly, as told by your health care provider. Use or practice techniques to help you relax, such as yoga, tai chi, meditation, or massage therapy. Lifestyle Change situations that cause you stress. Try to keep your work and personal schedules in balance. Do not use recreational or illegal drugs. General instructions Monitor your fatigue for any changes. Go to bed and get up at the same time every day. Avoid fatigue by pacing yourself during the day and getting enough sleep at night. Maintain a healthy weight. Contact a health care  provider if: Your fatigue does not get better. You have a fever. You suddenly lose or gain weight. You have headaches. You have trouble falling asleep or sleeping through the night. You feel angry, guilty, anxious, or sad. You have swelling in your legs or another part of your body. Get help right away if: You feel confused, feel like you might faint, or faint. Your vision is blurry or you have a severe headache. You have severe pain in your abdomen, your back, or the area between your waist and hips (pelvis). You have chest pain, shortness of breath, or an irregular or fast heartbeat. You are unable to urinate, or you urinate less than normal. You have abnormal bleeding from the rectum, nose, lungs, nipples, or, if you are female, the vagina. You vomit blood. You have thoughts about hurting yourself or others. These symptoms may be an emergency. Get help right away. Call 911. Do not wait to see if the symptoms will go away. Do not drive yourself to the hospital. Get help right away if you feel like you may hurt yourself or others, or have thoughts about taking your own life. Go to your nearest emergency room or: Call 911. Call the National Suicide Prevention Lifeline at (262)721-8699 or 988. This is open 24 hours a day. Text the Crisis Text Line at 8450584327. Summary If you have fatigue, you feel tired all the time and have a lack of energy or a lack of motivation. Fatigue may make it difficult to start or complete tasks because of exhaustion. Long-term (chronic) or extreme fatigue may be a symptom of a medical condition. Exercise regularly, as told by your health care provider.  Change situations that cause you stress. Try to keep your work and personal schedules in balance. This information is not intended to replace advice given to you by your health care provider. Make sure you discuss any questions you have with your health care provider. Document Revised: 02/26/2021 Document  Reviewed: 02/26/2021 Elsevier Patient Education  2024 [employer].

## 2023-05-20 NOTE — Progress Notes (Signed)
 Subjective:  Patient ID: Katherine Cruz, female    DOB: 1966-03-12  Age: 57 y.o. MRN: [id number]  CC: Fatigue   HPI [name] presents for f/up  Discussed the use of AI scribe software for clinical note transcription with the patient, who gave verbal consent to proceed.  History of Present Illness   The patient presents with a history of suspected pelvic congestion syndrome, identified on a recent CT scan. She initially sought medical attention due to a painful sensation in the groin area, which she associated with a vein. This discomfort was accompanied by unusual difficulty in urination. A healthcare provider initially suspected a hernia, leading to the CT scan that revealed enlarged veins, a characteristic of pelvic congestion syndrome.  The patient has a history of extremely painful menstrual periods and dyspareunia, which she believes aligns with the suspected diagnosis of pelvic congestion syndrome. She also reports chronic bloating, for which she previously sought medical attention. Gastrointestinal investigations for this symptom have been inconclusive.  The patient has identified an interventional radiologist, Dr. Karalee, who reportedly specializes in treating pelvic congestion syndrome. She learned about this specialist through social media and confirmed the information with a vein specialist. She is seeking a referral to this interventional radiologist for further management of her condition.      She wants to be tested for heavy metals.  Outpatient Medications Prior to Visit  Medication Sig Dispense Refill   nystatin  (MYCOSTATIN ) 100000 UNIT/ML suspension Take 5 mLs (500,000 Units total) by mouth 4 (four) times daily. 473 mL 1   No facility-administered medications prior to visit.    ROS Review of Systems  Constitutional:  Positive for fatigue. Negative for appetite change, chills, diaphoresis, fever and unexpected weight change.  HENT:  Positive for sore throat.  Negative for ear pain, facial swelling, trouble swallowing and voice change.   Respiratory:  Negative for cough, chest tightness, shortness of breath, wheezing and stridor.   Cardiovascular:  Negative for chest pain, palpitations and leg swelling.  Gastrointestinal:  Positive for abdominal pain (intermittent bloating). Negative for blood in stool, constipation, diarrhea and nausea.  Genitourinary:  Positive for difficulty urinating, dyspareunia and pelvic pain. Negative for decreased urine volume, dysuria, flank pain, frequency, hematuria, menstrual problem, vaginal bleeding, vaginal discharge and vaginal pain.  Musculoskeletal: Negative.  Negative for arthralgias and myalgias.  Skin: Negative.   Neurological: Negative.  Negative for dizziness, weakness, light-headedness and numbness.  Hematological:  Negative for adenopathy. Does not bruise/bleed easily.  Psychiatric/Behavioral:  Positive for dysphoric mood. Negative for agitation, behavioral problems, confusion, decreased concentration, hallucinations, self-injury, sleep disturbance and suicidal ideas. The patient is not nervous/anxious and is not hyperactive.     Objective:  BP 116/82 (BP Location: Left Arm, Patient Position: Sitting, Cuff Size: Normal)   Pulse 75   Temp 98.5 F (36.9 C) (Oral)   Resp 16   Ht 5' 4 (1.626 m)   Wt 119 lb (54 kg)   LMP 03/24/2015 (Approximate)   SpO2 100%   BMI 20.43 kg/m   BP Readings from Last 3 Encounters:  05/20/23 116/82  05/08/23 110/70  05/07/23 110/74    Wt Readings from Last 3 Encounters:  05/20/23 119 lb (54 kg)  05/08/23 119 lb 4 oz (54.1 kg)  01/16/23 117 lb (53.1 kg)    Physical Exam Vitals reviewed.  Constitutional:      General: She is not in acute distress.    Appearance: Normal appearance. She is not ill-appearing, toxic-appearing or diaphoretic.  HENT:     Nose: Nose normal.     Mouth/Throat:     Mouth: Mucous membranes are moist.     Palate: No mass and lesions.      Pharynx: Posterior oropharyngeal erythema present. No pharyngeal swelling, oropharyngeal exudate, uvula swelling or postnasal drip.  Eyes:     General: No scleral icterus.    Conjunctiva/sclera: Conjunctivae normal.  Cardiovascular:     Rate and Rhythm: Normal rate and regular rhythm.     Pulses: Normal pulses.     Heart sounds: No murmur heard.    No friction rub. No gallop.  Pulmonary:     Effort: Pulmonary effort is normal.     Breath sounds: No stridor. No wheezing, rhonchi or rales.  Abdominal:     General: Abdomen is flat.     Palpations: There is no mass.     Tenderness: There is no abdominal tenderness. There is no guarding.     Hernia: No hernia is present.  Musculoskeletal:        General: Normal range of motion.     Cervical back: Neck supple.     Right lower leg: No edema.     Left lower leg: No edema.  Lymphadenopathy:     Cervical: No cervical adenopathy.  Skin:    General: Skin is warm and dry.     Findings: No rash.  Neurological:     General: No focal deficit present.     Mental Status: She is alert. Mental status is at baseline.     Lab Results  Component Value Date   WBC 4.4 05/20/2023   HGB 14.1 05/20/2023   HCT 41.8 05/20/2023   PLT 191.0 05/20/2023   GLUCOSE 93 05/20/2023   CHOL 209 (H) 10/23/2022   TRIG 91.0 10/23/2022   HDL 75.40 10/23/2022   LDLDIRECT 133 (H) 12/03/2021   LDLCALC 116 (H) 10/23/2022   ALT 20 09/05/2022   AST 20 09/05/2022   NA 141 05/20/2023   K 3.7 05/20/2023   CL 102 05/20/2023   CREATININE 0.66 05/20/2023   BUN 13 05/20/2023   CO2 30 05/20/2023   TSH 3.69 05/20/2023    CT ABDOMEN PELVIS WO CONTRAST Result Date: 05/06/2023 CLINICAL DATA:  Quadrant abdominal pain.  Abdominal mass. EXAM: CT ABDOMEN AND PELVIS WITHOUT CONTRAST TECHNIQUE: Multidetector CT imaging of the abdomen and pelvis was performed following the standard protocol without IV contrast. Patient declined enteric contrast. RADIATION DOSE REDUCTION: This  exam was performed according to the departmental dose-optimization program which includes automated exposure control, adjustment of the mA and/or kV according to patient size and/or use of iterative reconstruction technique. COMPARISON:  Right upper quadrant ultrasound 04/03/2021. FINDINGS: Lower chest: No basilar airspace disease.  No pleural effusion. Hepatobiliary: No evidence of focal liver abnormality in the absence of IV contrast. Elongated right lobe of the liver. Layering gallstones without abnormal gallbladder distention or pericholecystic inflammation. Pancreas: Lack of contrast limits assessment. Unremarkable unenhanced appearance. Spleen: Unremarkable unenhanced appearance. Adrenals/Urinary Tract: No adrenal nodule. No hydronephrosis. No renal calculi. No evidence of focal renal abnormality on this unenhanced exam. Partially distended urinary bladder, normal for degree of distension. Stomach/Bowel: Bowel assessment is limited in the absence of enteric contrast and paucity of intra-abdominal fat. The stomach is decompressed. There is no bowel obstruction or inflammatory change. Slight fecalization of central small bowel contents. Moderate volume of stool in the colon. Occasional colonic diverticula without diverticulitis. Normal appendix. No obvious colonic mass on this unenhanced  exam. Vascular/Lymphatic: Normal caliber abdominal aorta. There may be a dilated gonadal veins, left ovarian vein measures 8 mm, right ovarian vein measures 6 mm. No abdominopelvic adenopathy. Reproductive: Unremarkable unenhanced appearance. Other: No evidence of abdominopelvic mass. No free fluid or free air. No abdominal wall hernia Musculoskeletal: Mild lumbar spondylosis with anterior spurring. Mild lower lumbar facet hypertrophy. Mild degenerative change of both hips, left greater than right. There are no acute or suspicious osseous abnormalities. IMPRESSION: 1. No acute abnormality or evidence of abdominal/pelvis mass on  this unenhanced exam. 2. Cholelithiasis without cholecystitis. 3. Moderate colonic stool burden with slight fecalization of central small bowel contents, suggesting slow transit/constipation. 4. Possible dilated gonadal veins, can be seen with pelvic congestion syndrome. This is not well assessed in the absence of IV contrast. Electronically Signed   By: Andrea Gasman M.D.   On: 05/06/2023 16:39    Assessment & Plan:  Pharyngitis, unspecified etiology -     Heavy Metals Profile, Urine; Future -     Ambulatory referral to ENT  Elevated TSH- She is euthyroid. -     Heavy Metals Profile, Urine; Future -     Thyroid  Panel With TSH; Future  Chronic fatigue- Labs are negative for secondary causes. -     Heavy Metals Profile, Urine; Future -     Thyroid  Panel With TSH; Future -     CBC with Differential/Platelet; Future -     Basic metabolic panel; Future  Vitamin D  deficiency -     Heavy Metals Profile, Urine; Future -     VITAMIN D  25 Hydroxy (Vit-D Deficiency, Fractures); Future -     Cholecalciferol ; Take 1 capsule (50,000 Units total) by mouth once a week.  Dispense: 12 capsule; Refill: 0 -     Amb ref to Medical Nutrition Therapy-MNT  Pelvic congestion syndrome -     Ambulatory referral to Interventional Radiology     Follow-up: Return in about 3 months (around 08/18/2023).  Debby Molt, MD

## 2023-05-22 ENCOUNTER — Ambulatory Visit: Payer: 59 | Admitting: Internal Medicine

## 2023-05-22 LAB — THYROID PANEL WITH TSH
Free Thyroxine Index: 3.1 (ref 1.4–3.8)
T3 Uptake: 33 % (ref 22–35)
T4, Total: 9.4 ug/dL (ref 5.1–11.9)
TSH: 3.69 m[IU]/L (ref 0.40–4.50)

## 2023-05-22 LAB — HEAVY METALS PROFILE, URINE

## 2023-05-23 ENCOUNTER — Ambulatory Visit: Payer: 59 | Admitting: Family Medicine

## 2023-05-26 ENCOUNTER — Encounter: Payer: Self-pay | Admitting: Internal Medicine

## 2023-05-27 ENCOUNTER — Encounter (INDEPENDENT_AMBULATORY_CARE_PROVIDER_SITE_OTHER): Payer: Self-pay | Admitting: Otolaryngology

## 2023-05-28 ENCOUNTER — Other Ambulatory Visit: Payer: Self-pay | Admitting: Internal Medicine

## 2023-05-28 DIAGNOSIS — N9489 Other specified conditions associated with female genital organs and menstrual cycle: Secondary | ICD-10-CM

## 2023-05-29 ENCOUNTER — Other Ambulatory Visit: Payer: Self-pay | Admitting: Internal Medicine

## 2023-05-29 ENCOUNTER — Other Ambulatory Visit: Payer: 59

## 2023-05-29 DIAGNOSIS — R251 Tremor, unspecified: Secondary | ICD-10-CM

## 2023-05-29 DIAGNOSIS — R5382 Chronic fatigue, unspecified: Secondary | ICD-10-CM

## 2023-06-01 LAB — HEAVY METALS PROFILE, URINE
Arsenic, 24H Ur: <10 ug/L (ref <=80)
Lead, 24 hr urine: <10 ug/L (ref <80)
Mercury, 24H Ur: <4 ug/L (ref <=20)

## 2023-06-03 ENCOUNTER — Encounter: Payer: Self-pay | Admitting: Internal Medicine

## 2023-06-05 ENCOUNTER — Ambulatory Visit (INDEPENDENT_AMBULATORY_CARE_PROVIDER_SITE_OTHER): Payer: 59 | Admitting: Allergy & Immunology

## 2023-06-05 ENCOUNTER — Other Ambulatory Visit: Payer: Self-pay

## 2023-06-05 ENCOUNTER — Encounter: Payer: Self-pay | Admitting: Allergy & Immunology

## 2023-06-05 VITALS — BP 102/64 | HR 72 | Temp 98.1°F | Ht 62.72 in | Wt 119.7 lb

## 2023-06-05 DIAGNOSIS — R140 Abdominal distension (gaseous): Secondary | ICD-10-CM | POA: Diagnosis not present

## 2023-06-05 DIAGNOSIS — Z9109 Other allergy status, other than to drugs and biological substances: Secondary | ICD-10-CM

## 2023-06-05 DIAGNOSIS — K1370 Unspecified lesions of oral mucosa: Secondary | ICD-10-CM | POA: Diagnosis not present

## 2023-06-05 DIAGNOSIS — J310 Chronic rhinitis: Secondary | ICD-10-CM | POA: Diagnosis not present

## 2023-06-05 NOTE — Patient Instructions (Addendum)
1. Bloating - We will definitely test for the most common foods (this rules out more than 95% of all food allergies). - Food sheet provided (circle the numbers of the foods that you might be interested in testing).   2. Chronic rhinitis - Because of insurance stipulations, we cannot do skin testing on the same day as your first visit. - We are all working to fight this, but for now we need to do two separate visits.  - We will know more after we do testing at the next visit.  - The skin testing visit can be squeezed in at your convenience.  - Then we can make a more full plan to address all of your symptoms. - Be sure to stop your antihistamines for 3 days before this appointment.   3. Oral lesion - We will see what the orthodontist does before we do anything. - Reassuringly, there is no history of fever.   4. Nickel allergy - We will do patch testing to the True Test and the metals. - These tests are placed on a Monday and then read on a Wednesday and Friday.   5. Return in about 1 week (around 06/12/2023) for SKIN TESTING (1-55 + SELECT FOODS) and  PATCH TESTING. You can have the follow up appointment with Dr. Dellis Anes or a Nurse Practicioner (our Nurse Practitioners are excellent and always have Physician oversight!).    Please inform us of any Emergency Department visits, hospitalizations, or changes in symptoms. Call us before going to the ED for breathing or allergy symptoms since we might be able to fit you in for a sick visit. Feel free to contact us anytime with any questions, problems, or concerns.  It was a pleasure to meet you today!  Websites that have reliable patient information: 1. American Academy of Asthma, Allergy, and Immunology: www.aaaai.org 2. Food Allergy Research and Education (FARE): foodallergy.org 3. Mothers of Asthmatics: http://www.asthmacommunitynetwork.org 4. American College of Allergy, Asthma, and Immunology: www.acaai.org      "Like" [place] on  Facebook and Instagram for our latest updates!      A healthy democracy works best when [employer] participate! Make sure you are registered to vote! If you have moved or changed any of your contact information, you will need to get this updated before voting! Scan the QR codes below to learn more!

## 2023-06-05 NOTE — Progress Notes (Addendum)
 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 Bloating - We will definitely test for the most common foods (this rules out more than 95% of all food allergies). - Food sheet provided (circle the numbers of the foods that you might be interested in testing).   2. Chronic rhinitis - Because of insurance stipulations, we cannot do skin testing on the same day as your first visit. - We are all working to fight this, but for now we need to do two separate visits.  - We will know more after we do testing at the next visit.  - The skin testing visit can be squeezed in at your convenience.  - Then we can make a more full plan to address all of your symptoms. - Be sure to stop your antihistamines for 3 days before this appointment.   3. Oral lesion - We will see what the orthodontist does before we do anything. - Reassuringly, there is no history of fever.   4. Nickel allergy  - We will do patch testing to the True Test and the metals. - These tests are placed on a Monday and then read on a Wednesday and Friday.   5. Return in about 1 week (around 06/12/2023) for SKIN TESTING (1-55 + SELECT FOODS) and  PATCH TESTING. You can have the follow up appointment with Dr. Iva or a Nurse Practicioner (our Nurse Practitioners are excellent and always have Physician oversight!).     This note in its entirety was forwarded to the Provider who requested this consultation.  Subjective:   Katherine Cruz is a  58 y.o. female presenting today for evaluation of  Chief Complaint  Patient presents with   Allergies    Food Allergies. Bloating    [name] has a history of the following: Patient Active Problem List   Diagnosis Date Noted   Chronic fatigue 05/20/2023   Vitamin D  deficiency 05/20/2023   Pelvic congestion syndrome 05/20/2023   Pharyngitis 04/04/2023   Aortic atherosclerosis (HCC) 10/23/2022   Former cigarette smoker 09/05/2022   Elevated TSH 09/05/2022   Screening mammogram for breast cancer 09/05/2022   Multiple gallstones 04/04/2021   Estrogen deficiency 12/18/2020   Colon cancer screening 12/18/2020   Visit for screening mammogram 12/18/2020   MVP (mitral valve prolapse) 12/18/2020   Other emphysema (HCC) 12/18/2020   GAD (generalized anxiety disorder) 12/18/2020   Encounter for general adult medical examination with abnormal findings 12/18/2020   Need for shingles vaccine 12/15/2019   Mixed hyperlipidemia 12/03/2019   Onychomycosis 12/03/2019   Tremor of both hands 11/28/2019   Generalized osteoarthritis of multiple sites 01/03/2014   Atopic rhinitis 11/24/2009    History obtained from: chart review and patient.  Discussed the use of AI scribe software for clinical note transcription with the patient and/or guardian, who gave verbal consent to proceed.  Katherine Cruz was referred by Katherine Debby CROME, MD.     Katherine Cruz is a 58 y.o. female with a history of  fibromyalgia presenting for an evaluation of bloating and multiple other concerns.  Allergic Rhinitis Symptom History: The patient also reports frequent sneezing, eye allergies, and a constant sore throat, which is often red with white specks. She has never had environmental allergy  testing performed.  Food Allergy  Symptom History: Katherine Cruz has a history of fibromyalgia and presents with a chief complaint of chronic bloating, which has been ongoing for approximately two years. The patient describes the bloating as  severe, to the point of appearing pregnant. The patient also reports constant nausea, particularly after eating. The patient has not undergone an endoscopy or colonoscopy, but has completed a Cologuard test, which was negative for colon cancer. The patient follows a vegan diet and tries to avoid chemicals in her life, using vinegar and water for cleaning. She has a sister with chronic abdominal pain. But she has no contact with her sister right now. She is worried that this is a manifestation of her food allergy .  She reports an open sore in the mouth that has not healed. The patient has seen a dentist who suggested it might be an abscess from a tooth, but the patient experiences no pain. The patient also reports feeling unwell most of the time, with the only identified issue being low vitamin D  levels.  She has been diagnosed with a syndrome related to veins, specifically pelvic congestion syndrome, which was discovered during a CT scan. The patient reports experiencing strong pain in the groin, particularly on the right side, and describes the pain as similar to the discomfort experienced when her period first started.  Skin Symptom History: She also reports a suspected allergy  to metals, particularly nickel, which is a concern due to the use of a stainless steel device with nickel content for coffee preparation.  The patient has a history of anxiety and is currently on disability due to fibromyalgia.   therwise, there is no history of other atopic diseases, including asthma, drug allergies, stinging insect allergies, or contact dermatitis. There is no significant infectious history. Vaccinations are up to date.    Past Medical History: Patient Active Problem List   Diagnosis Date Noted   Chronic fatigue 05/20/2023   Vitamin D  deficiency 05/20/2023   Pelvic congestion syndrome 05/20/2023   Pharyngitis 04/04/2023   Aortic atherosclerosis (HCC) 10/23/2022   Former cigarette smoker 09/05/2022    Elevated TSH 09/05/2022   Screening mammogram for breast cancer 09/05/2022   Multiple gallstones 04/04/2021   Estrogen deficiency 12/18/2020   Colon cancer screening 12/18/2020   Visit for screening mammogram 12/18/2020   MVP (mitral valve prolapse) 12/18/2020   Other emphysema (HCC) 12/18/2020   GAD (generalized anxiety disorder) 12/18/2020   Encounter for general adult medical examination with abnormal findings 12/18/2020   Need for shingles vaccine 12/15/2019   Mixed hyperlipidemia 12/03/2019   Onychomycosis 12/03/2019   Tremor of both hands 11/28/2019   Generalized osteoarthritis of multiple sites 01/03/2014   Atopic rhinitis 11/24/2009    Medication List:  Allergies as of 06/05/2023       Reactions   Latex Itching   Nickel Rash        Medication List        Accurate as of June 05, 2023 11:59 PM. If you have any questions, ask your nurse or doctor.          Cholecalciferol  1.25 MG (50000 UT) capsule Take 1 capsule (50,000 Units total) by mouth once a week.        Birth History:  non-contributory  Developmental History: non-contributory  Past Surgical History: Past Surgical History:  Procedure Laterality Date   CERVICAL POLYPECTOMY     MAXILLARY SINUS LIFT     REPLANTATION THUMB     SINUS LIFT WITH BONE GRAFT     TUBAL LIGATION       Family History: Family History  Problem Relation Age of Onset   Arthritis Mother    Hearing loss Mother    Mental illness Mother    Irritable bowel syndrome Mother    Tremor Mother    Allergies Father    Alcohol abuse Father    Drug abuse Sister    Mental illness Sister    Parkinson's disease Maternal Aunt    Heart disease Neg Hx    Hypertension Neg Hx    Diabetes Neg Hx    Cancer Neg Hx        breast or colon   Stroke Neg Hx    Colon cancer Neg Hx      Social History: Eleonora lives at home with her boyfriend.  She lives in a house that is around 24 years old.  There is carpeting throughout the house.   She has gas heating and central cooling.  There is 1 dog inside of the home.  There are no dust mite covers on bedding.  There is no tobacco exposure.  There is no fume, chemical, or dust exposure.  There is no HEPA filter.  They do not live near an interstate or industrial area.   Review of systems otherwise negative other than that mentioned in the HPI.    Objective:   Blood pressure 102/64, pulse 72, temperature 98.1 F (36.7 C), temperature source Temporal, height 5' 2.72 (1.593 m), weight 119 lb 11.2 oz (54.3 kg), last menstrual period 03/24/2015, SpO2 100%. Body mass index is 21.4 kg/m.     Physical Exam Vitals reviewed.  Constitutional:      Appearance: Normal appearance. She is well-developed and normal weight. She is not ill-appearing or toxic-appearing.     Comments: Somewhat anxious appearing.  HENT:     Head: Normocephalic and atraumatic.     Right Ear: Tympanic membrane, ear canal and external ear normal. No drainage, swelling or tenderness. Tympanic membrane is not injected, scarred, erythematous, retracted or bulging.     Left Ear: Tympanic membrane, ear canal and external ear normal. No drainage, swelling or tenderness. Tympanic membrane is not injected, scarred, erythematous, retracted or bulging.     Nose: No nasal deformity, septal deviation, mucosal edema or rhinorrhea.     Right Turbinates: Enlarged and swollen.     Left Turbinates: Enlarged and swollen.     Right Sinus: No maxillary sinus tenderness or frontal sinus tenderness.     Left Sinus: No maxillary sinus tenderness or frontal sinus tenderness.     Comments: No polyps.    Mouth/Throat:     Lips: Pink.     Mouth: Mucous membranes are moist. Mucous membranes are not pale and not dry.     Pharynx: Uvula midline. Posterior oropharyngeal erythema present.  Eyes:     General:        Right eye: No discharge.        Left eye: No discharge.     Conjunctiva/sclera: Conjunctivae normal.     Right eye:  Right conjunctiva is not injected. No chemosis.    Left eye: Left conjunctiva is not injected. No chemosis.    Pupils: Pupils are equal, round, and  reactive to light.  Cardiovascular:     Rate and Rhythm: Normal rate and regular rhythm.     Heart sounds: Normal heart sounds.  Pulmonary:     Effort: Pulmonary effort is normal. No tachypnea, accessory muscle usage or respiratory distress.     Breath sounds: Normal breath sounds. No wheezing, rhonchi or rales.  Chest:     Chest wall: No tenderness.  Abdominal:     Tenderness: There is no abdominal tenderness. There is no guarding or rebound.  Lymphadenopathy:     Head:     Right side of head: No submandibular, tonsillar or occipital adenopathy.     Left side of head: No submandibular, tonsillar or occipital adenopathy.     Cervical: No cervical adenopathy.  Skin:    Coloration: Skin is not pale.     Findings: No abrasion, erythema, petechiae or rash. Rash is not papular, urticarial or vesicular.  Neurological:     Mental Status: She is alert.  Psychiatric:        Behavior: Behavior is cooperative.      Diagnostic studies: deferred due to insurance stipulations that require a separate visit for testing        Marty Shaggy, MD [facility]       "

## 2023-06-08 NOTE — Progress Notes (Deleted)
    Follow-up Note  RE: Katherine Cruz MRN: [id number] DOB: 1965/11/03 Date of Office Visit: 06/09/2023  Primary care provider: Etta Grandchild, MD Referring provider: Etta Grandchild, MD   [name] returns to the office today for the patch test placement, given suspected history of contact dermatitis.    Diagnostics:  Metal Patch and TRUE Test patches placed.    Plan:   Suspected Allergic contact dermatitis  Discussed with patient that patch testing tests for contact dermatitis and sometimes it does not correlate to how one will react to  metals or other allergens   in the body. Positive patch testing results can help in avoiding those items however it is possible to get false negative results.  Nevertheless, this is one of the most accessible test for  metal sensitivity and contact dermatitis  currently available  - Instructions provided on care of the patches for the next 48 hours. [name] was instructed to avoid showering for the next 48 hours. [name] will follow up in 48 hours and 96 hours for patch readings.     [name], MD [facility]

## 2023-06-09 ENCOUNTER — Ambulatory Visit: Payer: 59 | Admitting: Internal Medicine

## 2023-06-09 ENCOUNTER — Other Ambulatory Visit: Payer: Self-pay | Admitting: Nurse Practitioner

## 2023-06-09 ENCOUNTER — Telehealth: Payer: Self-pay | Admitting: Internal Medicine

## 2023-06-09 ENCOUNTER — Encounter: Payer: Self-pay | Admitting: Allergy & Immunology

## 2023-06-09 DIAGNOSIS — J029 Acute pharyngitis, unspecified: Secondary | ICD-10-CM

## 2023-06-09 NOTE — Telephone Encounter (Unsigned)
Copied from CRM [id number]. Topic: General - Other >> Jun 09, 2023  1:27 PM Donita Brooks wrote: Reason for CRM: pt would like to speak with Dr. Yetta Barre about test results. pt is not understanding test results. Pt also doesn't see her being tested for electrolytes. and also pt is stating that her first nutrition referral was sent to the wrong specialist. pt would like for referral to go to a nutrition that treats eating disorder.

## 2023-06-10 NOTE — Telephone Encounter (Signed)
Unable to reach patient. LMTRC  

## 2023-06-11 ENCOUNTER — Encounter: Payer: 59 | Admitting: Family

## 2023-06-11 NOTE — Telephone Encounter (Signed)
Discussed results with patient and her nutritionist referral. She gave a verbal understanding.

## 2023-06-13 ENCOUNTER — Encounter: Payer: 59 | Admitting: Internal Medicine

## 2023-06-17 ENCOUNTER — Encounter: Payer: Self-pay | Admitting: Allergy & Immunology

## 2023-06-17 ENCOUNTER — Ambulatory Visit (INDEPENDENT_AMBULATORY_CARE_PROVIDER_SITE_OTHER): Payer: 59 | Admitting: Allergy & Immunology

## 2023-06-17 DIAGNOSIS — J310 Chronic rhinitis: Secondary | ICD-10-CM

## 2023-06-17 DIAGNOSIS — K1370 Unspecified lesions of oral mucosa: Secondary | ICD-10-CM

## 2023-06-17 DIAGNOSIS — Z9109 Other allergy status, other than to drugs and biological substances: Secondary | ICD-10-CM

## 2023-06-17 DIAGNOSIS — J309 Allergic rhinitis, unspecified: Secondary | ICD-10-CM

## 2023-06-17 DIAGNOSIS — R140 Abdominal distension (gaseous): Secondary | ICD-10-CM

## 2023-06-17 NOTE — Progress Notes (Signed)
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 Bloating - Testing was very reactive to egg (which is likely not relevant since you are a vegan). - Testing was slightly reactive tomato, ginger, and mushrooms. - These might or might not be relevant. - Copy of testing results provided.  - We are getting blood work for stuff that we could not test on the skin.   2. Chronic rhinitis - We are getting blood work to look for environmental allergies. - We will call you in 1-2 weeks with the results of the testing.  3. Oral lesion - We will see what the orthodontist does before we do anything. - Reassuringly, there is no history of fever.   4. Nickel allergy - Patch testing scheduled.   5. Follow up as scheduled for patch testing.   Subjective:   Katherine Cruz is a 58 y.o. female presenting today for follow up of No chief complaint on file.   Manuelita Cruz has a history of the following: Patient Active Problem List   Diagnosis Date Noted   Chronic fatigue 05/20/2023   Vitamin D deficiency 05/20/2023   Pelvic congestion syndrome 05/20/2023   Pharyngitis 04/04/2023   Aortic atherosclerosis (HCC) 10/23/2022   Former cigarette smoker 09/05/2022   Elevated TSH 09/05/2022   Screening mammogram for breast cancer 09/05/2022   Multiple gallstones 04/04/2021   Estrogen deficiency 12/18/2020   Colon cancer screening 12/18/2020   Visit for screening mammogram 12/18/2020   MVP (mitral valve prolapse)  12/18/2020   Other emphysema (HCC) 12/18/2020   GAD (generalized anxiety disorder) 12/18/2020   Encounter for general adult medical examination with abnormal findings 12/18/2020   Need for shingles vaccine 12/15/2019   Mixed hyperlipidemia 12/03/2019   Onychomycosis 12/03/2019   Tremor of both hands 11/28/2019   Generalized osteoarthritis of multiple sites 01/03/2014   Atopic rhinitis 11/24/2009    History obtained from: chart review and patient.  Discussed the use of AI scribe software for clinical note transcription with the patient and/or guardian, who gave verbal consent to proceed.  Katherine Cruz is a 58 y.o. female presenting for skin testing. She was last seen on January 2025. We could not do testing because her insurance company does not cover testing on the same day as a New Patient visit. She has been off of all antihistamines 3 days in anticipation of the testing.   At the last visit, she was reporting bloating we decided to do testing to the most common foods.  We also gave her she can take home to look at any other food that might be relevant.  She ended up struggling nearly everything except for the needs and fish since she is vegan.  For her rhinitis, we decided to do allergy testing for that.  She was not using anything on a routine basis for her environmental allergies.  Otherwise, there have been no changes to her past medical history, surgical history, family history, or social history.    Review  of systems otherwise negative other than that mentioned in the HPI.    Objective:   Last menstrual period 03/24/2015. There is no height or weight on file to calculate BMI.    Physical exam deferred since this was a skin testing appointment only.   Diagnostic studies:   Allergy Studies:     Food Adult Perc - 06/17/23 1000     Time Antigen Placed 1029    Allergen Manufacturer Waynette Buttery    Location Back    Number of allergen test 57     Control-buffer 50% Glycerol  Negative    Control-Histamine 2+    1. Peanut Negative    2. Soybean Negative    3. Wheat Negative    4. Sesame Negative    5. Milk, Cow Negative    6. Casein Negative    7. Egg White, Chicken --   7 x 13   8. Shellfish Mix Negative    9. Fish Mix Negative    10. Cashew Negative    11. Walnut Food Negative    12. Almond Negative    13. Hazelnut Negative    14. Pecan Food Negative    15. Pistachio Negative    16. [place] Nut Negative    17. Coconut Negative    28. Oat  Negative    29. Rice Negative    30. Barley Negative    31. Rye  Negative    32. Hops Negative    38. Tomato --   2 x 4   39. White Potato Negative    40. Sweet Potato Negative    41. Pea, Green/English Negative    42. Navy Bean Negative    43. Green Beans Negative    44. Squash Negative    45. Green Pepper Negative    46. Mushrooms --   3 x 5   47. Onion Negative    48. Avocado Negative    49. Cabbage Negative    50. Carrots Negative    51. Celery Negative    52. Corn Negative    53. Cucumber Negative    54. Grape (White seedless) Negative    55. Orange  Negative    56. Lemon Negative    57. Banana Negative    58. Apple Negative    59. Peach Negative    60. Strawberry Negative    61. Blueberry Negative    62. Cherry Negative    63. Cantaloupe Negative    64. Watermelon Negative    65. Pineapple Negative    66. Chocolate/Cacao Bean Negative    67. Cinnamon Negative    68. Nutmeg Negative    69. Ginger --   2 x 5   70. Garlic Negative    71. Pepper, Black Negative    72. Mustard Negative             Allergy testing results were read and interpreted by myself, documented by clinical staff.      Malachi Bonds, MD  [facility]

## 2023-06-17 NOTE — Patient Instructions (Addendum)
1. Bloating - Testing was very reactive to egg (which is likely not relevant since you are a vegan). - Testing was slightly reactive tomato, ginger, and mushrooms. - These might or might not be relevant. - Copy of testing results provided.  - We are getting blood work for stuff that we could not test on the skin.   2. Chronic rhinitis - We are getting blood work to look for environmental allergies. - We will call you in 1-2 weeks with the results of the testing.  3. Oral lesion - We will see what the orthodontist does before we do anything. - Reassuringly, there is no history of fever.   4. Nickel allergy - Patch testing scheduled.   5. Follow up as scheduled for patch testing.   Please inform us of any Emergency Department visits, hospitalizations, or changes in symptoms. Call us before going to the ED for breathing or allergy symptoms since we might be able to fit you in for a sick visit. Feel free to contact us anytime with any questions, problems, or concerns.  It was a pleasure to see you again today!  Websites that have reliable patient information: 1. American Academy of Asthma, Allergy, and Immunology: www.aaaai.org 2. Food Allergy Research and Education (FARE): foodallergy.org 3. Mothers of Asthmatics: http://www.asthmacommunitynetwork.org 4. American College of Allergy, Asthma, and Immunology: www.acaai.org      "Like" [place] on Facebook and Instagram for our latest updates!      A healthy democracy works best when [employer] participate! Make sure you are registered to vote! If you have moved or changed any of your contact information, you will need to get this updated before voting! Scan the QR codes below to learn more!       Food Adult Perc - 06/17/23 1000     Time Antigen Placed 1029    Allergen Manufacturer Waynette Buttery    Location Back    Number of allergen test 57     Control-buffer 50% Glycerol Negative    Control-Histamine 2+    1. Peanut Negative    2.  Soybean Negative    3. Wheat Negative    4. Sesame Negative    5. Milk, Cow Negative    6. Casein Negative    7. Egg White, Chicken --   7 x 13   8. Shellfish Mix Negative    9. Fish Mix Negative    10. Cashew Negative    11. Walnut Food Negative    12. Almond Negative    13. Hazelnut Negative    14. Pecan Food Negative    15. Pistachio Negative    16. [place] Nut Negative    17. Coconut Negative    28. Oat  Negative    29. Rice Negative    30. Barley Negative    31. Rye  Negative    32. Hops Negative    38. Tomato --   2 x 4   39. White Potato Negative    40. Sweet Potato Negative    41. Pea, Green/English Negative    42. Navy Bean Negative    43. Green Beans Negative    44. Squash Negative    45. Green Pepper Negative    46. Mushrooms --   3 x 5   47. Onion Negative    48. Avocado Negative    49. Cabbage Negative    50. Carrots Negative    51. Celery Negative    52. Corn Negative  53. Cucumber Negative    54. Grape (White seedless) Negative    55. Orange  Negative    56. Lemon Negative    57. Banana Negative    58. Apple Negative    59. Peach Negative    60. Strawberry Negative    61. Blueberry Negative    62. Cherry Negative    63. Cantaloupe Negative    64. Watermelon Negative    65. Pineapple Negative    66. Chocolate/Cacao Bean Negative    67. Cinnamon Negative    68. Nutmeg Negative    69. Ginger --   2 x 5   70. Garlic Negative    71. Pepper, Black Negative    72. Mustard Negative

## 2023-06-19 ENCOUNTER — Ambulatory Visit
Admission: RE | Admit: 2023-06-19 | Discharge: 2023-06-19 | Disposition: A | Discharge status: Home / Self Care | Payer: 59 | Source: Ambulatory Visit | Attending: Internal Medicine | Admitting: Internal Medicine

## 2023-06-19 DIAGNOSIS — N9489 Other specified conditions associated with female genital organs and menstrual cycle: Secondary | ICD-10-CM

## 2023-06-19 HISTORY — PX: IR RADIOLOGIST EVAL & MGMT: IMG5224
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
## 2023-06-20 ENCOUNTER — Telehealth: Payer: Self-pay | Admitting: Allergy & Immunology

## 2023-06-20 LAB — ALLERGENS W/TOTAL IGE AREA 2
Alternaria Alternata IgE: <0.1 kU/L
Aspergillus Fumigatus IgE: <0.1 kU/L
Bermuda Grass IgE: <0.1 kU/L
Cat Dander IgE: <0.1 kU/L
Cedar, Mountain IgE: <0.1 kU/L
Cladosporium Herbarum IgE: 0.1 kU/L — AB
Cockroach, German IgE: <0.1 kU/L
Common Silver Birch IgE: <0.1 kU/L
Cottonwood IgE: <0.1 kU/L
D Farinae IgE: 0.51 kU/L — AB
D Pteronyssinus IgE: 0.17 kU/L — AB
Dog Dander IgE: 0.17 kU/L — AB
Elm, American IgE: <0.1 kU/L
IgE (Immunoglobulin E), Serum: 115 [IU]/mL (ref 6–495)
Johnson Grass IgE: <0.1 kU/L
Maple/Box Elder IgE: <0.1 kU/L
Mouse Urine IgE: <0.1 kU/L
Oak, White IgE: <0.1 kU/L
Pecan, Hickory IgE: <0.1 kU/L
Penicillium Chrysogen IgE: 0.16 kU/L — AB
Pigweed, Rough IgE: <0.1 kU/L
Ragweed, Short IgE: <0.1 kU/L
Sheep Sorrel IgE Qn: <0.1 kU/L
Timothy Grass IgE: <0.1 kU/L
White Mulberry IgE: <0.1 kU/L

## 2023-06-20 LAB — ALLERGEN, COFFEE, RF221: Coffee: <0.1 kU/L

## 2023-06-20 LAB — ALLERGEN, BAKERS YEAST, F45: F045-IgE Yeast: 0.12 kU/L — AB

## 2023-06-20 LAB — F342-IGE OLIVE, BLACK: F342-IgE Olive, Black: <0.1 kU/L

## 2023-06-20 LAB — ALLERGEN, CUMIN SEED: F265-IgE Cumin: <0.1 kU/L

## 2023-06-20 LAB — F214-IGE SPINACH: F214-IgE Spinach: <0.1 kU/L

## 2023-06-20 NOTE — Telephone Encounter (Signed)
Called and spoke to patient briefly and she expressed that she needed to see about her allergy to the lactulose that's in artificial sugars. Please advise if these labs were ordered to ease patient and see if she can complete upcoming test with her PCP. Patient expressed firmly that she only wanted the labs drawn that she provided to Dr. Dellis Anes at the time of her appointment.  Thank you.

## 2023-06-20 NOTE — Telephone Encounter (Signed)
Patient called stating she has reviewed her lab results on MyChart and does not see the labs that requested to have. Patient is requesting blood work for lactulose.

## 2023-06-23 ENCOUNTER — Encounter: Payer: Self-pay | Admitting: Allergy & Immunology

## 2023-06-23 ENCOUNTER — Telehealth: Payer: Self-pay | Admitting: Physician Assistant

## 2023-06-23 NOTE — Telephone Encounter (Signed)
I replied with her results.

## 2023-06-23 NOTE — Telephone Encounter (Signed)
Inbound call from patient requesting to speak about SIBO test further. States after her visit with [facility] she was advised they did not test for Lactulose. Patient is requesting a call to discuss further. Please advise, thank you.

## 2023-06-23 NOTE — Telephone Encounter (Signed)
Patient cannot be tested for lactulose allergy. She is very concerned about taking the SIBO test without knowing her risk. Asks if there are other options.

## 2023-06-24 NOTE — Telephone Encounter (Signed)
Patient is requesting a list of all the metals she will be tested for on Monday. Please send this list to the patients mychart.   Thanks

## 2023-06-25 ENCOUNTER — Institutional Professional Consult (permissible substitution) (INDEPENDENT_AMBULATORY_CARE_PROVIDER_SITE_OTHER): Payer: 59 | Admitting: Otolaryngology

## 2023-06-26 NOTE — Telephone Encounter (Signed)
 Patient agrees to this plan. [facility] will have the correct test kit mailed to her.

## 2023-06-27 NOTE — Progress Notes (Deleted)
    Follow-up Note  RE: Genesys Coggeshall MRN: [id number] DOB: November 16, 1965 Date of Office Visit: 06/30/2023  Primary care provider: Etta Grandchild, MD Referring provider: Etta Grandchild, MD   Oval returns to the office today for the patch test placement, given suspected history of contact dermatitis.    Diagnostics:  Metal Patch and TRUE Test patches placed.    Plan:   Suspected Allergic contact dermatitis  Discussed with patient that patch testing tests for contact dermatitis and sometimes it does not correlate to how one will react to  metals or other allergens   in the body. Positive patch testing results can help in avoiding those items however it is possible to get false negative results.  Nevertheless, this is one of the most accessible test for  metal sensitivity and contact dermatitis  currently available  - Instructions provided on care of the patches for the next 48 hours. Katherine Cruz was instructed to avoid showering for the next 48 hours. Katherine Cruz will follow up in 48 hours and 96 hours for patch readings.     [name], MD [facility]

## 2023-06-30 ENCOUNTER — Ambulatory Visit: Payer: 59 | Admitting: Internal Medicine

## 2023-06-30 ENCOUNTER — Telehealth: Payer: Self-pay | Admitting: Cardiology

## 2023-06-30 NOTE — Telephone Encounter (Signed)
 Pt c/o medication issue:  1. Name of Medication:  Doxycycline  15 MG DAILY  2. How are you currently taking this medication (dosage and times per day)?   3. Are you having a reaction (difficulty breathing--STAT)?   4. What is your medication issue?   Patient states she is having a root canal soon and being prescribed Doxycycline  15 MG to build bone, but she is afraid it may deposit calcium somewhere she doesn't want it. Please advise.

## 2023-06-30 NOTE — Telephone Encounter (Signed)
 Pt made aware of Dr. Silverio Drought recommendations, as indicated in this message.  Pt verbalized understanding and agrees with this plan.  Pt was more than gracious for all the assistance provided.

## 2023-06-30 NOTE — Telephone Encounter (Signed)
 I do not think doxycycline  is related to coronary artery calcification and coronary artery disease.  Thanks MJP

## 2023-06-30 NOTE — Telephone Encounter (Signed)
 Will forward to PharmD team for input on this message.   Will follow-up with the pt accordingly thereafter.

## 2023-07-02 ENCOUNTER — Encounter: Payer: 59 | Admitting: Allergy

## 2023-07-04 ENCOUNTER — Encounter: Payer: 59 | Admitting: Allergy

## 2023-07-09 ENCOUNTER — Encounter: Payer: 59 | Admitting: Obstetrics and Gynecology

## 2023-07-10 ENCOUNTER — Institutional Professional Consult (permissible substitution): Payer: 59 | Admitting: Pulmonary Disease

## 2023-07-17 ENCOUNTER — Ambulatory Visit: Payer: 59 | Admitting: Allergy & Immunology

## 2023-07-24 ENCOUNTER — Ambulatory Visit: Payer: 59 | Admitting: Allergy & Immunology

## 2023-07-31 ENCOUNTER — Encounter: Payer: 59 | Admitting: Obstetrics and Gynecology

## 2023-08-08 ENCOUNTER — Institutional Professional Consult (permissible substitution) (INDEPENDENT_AMBULATORY_CARE_PROVIDER_SITE_OTHER): Payer: 59 | Admitting: Otolaryngology

## 2023-08-12 ENCOUNTER — Ambulatory Visit: Payer: 59

## 2023-08-12 VITALS — Ht 63.0 in | Wt 115.0 lb

## 2023-08-12 DIAGNOSIS — Z5941 Food insecurity: Secondary | ICD-10-CM

## 2023-08-12 DIAGNOSIS — Z1231 Encounter for screening mammogram for malignant neoplasm of breast: Secondary | ICD-10-CM
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
## 2023-08-12 NOTE — Patient Instructions (Addendum)
 Katherine Cruz , Thank you for taking time to come for your Medicare Wellness Visit. I appreciate your ongoing commitment to your health goals. Please review the following plan we discussed and let me know if I can assist you in the future.   Referrals/Orders/Follow-Ups/Clinician Recommendations: It was nice talking with today.  You are due for a Shingles vaccine.  You are due for a colonoscopy as well.  I did send a message to Dr. Yetta Barre about your concerns for referrals.  Also I placed a [employer] referral for you today.  You should hear from someone to discuss your issues.  You have an order for:   [x]   3D Mammogram  Please call for appointment:  [facility] [address] [place], [place] [address] [phone]  Make sure to wear two-piece clothing.  No lotions, powders, or deodorants the day of the appointment. Make sure to bring picture ID and insurance card.  Bring list of medications you are currently taking including any supplements.    This is a list of the screening recommended for you and due dates:  Health Maintenance  Topic Date Due   Pneumococcal Vaccination (1 of 2 - PCV) Never done   Colon Cancer Screening  Never done   COVID-19 Vaccine (4 - 2024-25 season) 01/19/2023   Pap with HPV screening  06/29/2023   Medicare Annual Wellness Visit  08/06/2023   Flu Shot  08/18/2023*   Zoster (Shingles) Vaccine (1 of 2) 08/18/2023*   Screening for Lung Cancer  10/04/2023   Mammogram  10/30/2024   DTaP/Tdap/Td vaccine (3 - Td or Tdap) 02/02/2030   Hepatitis C Screening  Completed   HIV Screening  Completed   HPV Vaccine  Aged Out  *Topic was postponed. The date shown is not the original due date.    Advanced directives: (Declined) Advance directive discussed with you today. Even though you declined this today, please call our office should you change your mind, and we can give you the proper paperwork for you to fill out.  Next Medicare Annual  Wellness Visit scheduled for next year: Yes

## 2023-08-14 ENCOUNTER — Telehealth: Payer: Self-pay

## 2023-08-14 NOTE — Progress Notes (Signed)
   Telephone encounter was:  Unsuccessful.  08/14/2023 Name: Aby Gessel MRN: [id number] DOB: [birth year]  Unsuccessful outbound call made today to assist with:  Food Insecurity  Outreach Attempt:  1st Attempt  A HIPAA compliant voice message was left requesting a return call.  Instructed patient to call back    Lenard Forth [facility] Guide, Phone: [phone] Fax: [phone] Website: [email or web address]

## 2023-08-15 ENCOUNTER — Telehealth: Payer: Self-pay

## 2023-08-15 NOTE — Progress Notes (Signed)
   Telephone encounter was:  Unsuccessful.  08/15/2023 Name: Katherine Cruz MRN: [id number] DOB: [birth year]  Unsuccessful outbound call made today to assist with:  Food Insecurity  Outreach Attempt:  2nd Attempt  A HIPAA compliant voice message was left requesting a return call.  Instructed patient to call back    Lenard Forth [facility] Guide, Phone: [phone] Fax: [phone] Website: [email or web address]

## 2023-08-18 ENCOUNTER — Telehealth: Payer: Self-pay

## 2023-08-18 NOTE — Progress Notes (Signed)
   Telephone encounter was:  Unsuccessful.  08/18/2023 Name: Katherine Cruz MRN: [id number] DOB: 08-04-1965  Unsuccessful outbound call made today to assist with:  Food Insecurity  Outreach Attempt:  3rd Attempt.  Referral closed unable to contact patient.  A HIPAA compliant voice message was left requesting a return call.  Instructed patient to call back    Lenard Forth [facility] Guide, Phone: [phone] Fax: [phone] Website: [email or web address]

## 2023-08-19 HISTORY — PX: OTHER SURGICAL HISTORY: SHX169

## 2023-08-28 ENCOUNTER — Encounter: Payer: Self-pay | Admitting: Allergy & Immunology

## 2023-08-28 ENCOUNTER — Ambulatory Visit: Admitting: Allergy & Immunology

## 2023-08-28 ENCOUNTER — Other Ambulatory Visit: Payer: Self-pay

## 2023-08-28 VITALS — BP 104/62 | HR 68 | Temp 98.0°F

## 2023-08-28 DIAGNOSIS — Z9109 Other allergy status, other than to drugs and biological substances: Secondary | ICD-10-CM

## 2023-08-28 DIAGNOSIS — R140 Abdominal distension (gaseous): Secondary | ICD-10-CM

## 2023-08-28 DIAGNOSIS — J310 Chronic rhinitis: Secondary | ICD-10-CM | POA: Diagnosis not present

## 2023-08-28 NOTE — Patient Instructions (Addendum)
 1. Bloating - Testing was very reactive to egg (which is likely not relevant since you are a vegan). - Testing was slightly reactive tomato, ginger, and mushrooms. - Continue to avoid the above foods since this is working so well. - I am going to find some other articles on the mold and concern with cross reactivity with foods.    2. Perennial and seasonal allergic rhinitis (molds, dust mites, dog)  - Continue with salt water rinses as needed.   3. Oral lesion - Definitely make an appointment again with ENT. - I think that is going to be the ticket to see what is going on in your throat.   4. Nickel allergy - Consider doing the patch test.   5. Follow up as needed (I am happy to see you again if needed).    Please inform us of any Emergency Department visits, hospitalizations, or changes in symptoms. Call us before going to the ED for breathing or allergy symptoms since we might be able to fit you in for a sick visit. Feel free to contact us anytime with any questions, problems, or concerns.  It was a pleasure to see you again today!  Websites that have reliable patient information: 1. American Academy of Asthma, Allergy, and Immunology: www.aaaai.org 2. Food Allergy Research and Education (FARE): foodallergy.org 3. Mothers of Asthmatics: http://www.asthmacommunitynetwork.org 4. American College of Allergy, Asthma, and Immunology: www.acaai.org      "Like" [place] on Facebook and Instagram for our latest updates!      A healthy democracy works best when [employer] participate! Make sure you are registered to vote! If you have moved or changed any of your contact information, you will need to get this updated before voting! Scan the QR codes below to learn more!

## 2023-08-28 NOTE — Progress Notes (Unsigned)
 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 Bloating - Testing was very reactive to egg (which is likely not relevant since you are a vegan). - Testing was slightly reactive tomato, ginger, and mushrooms. - Continue to avoid the above foods since this is working so well. - I am going to find some other articles on the mold and concern with cross reactivity with foods.    2. Perennial and seasonal allergic rhinitis (molds, dust mites, dog)  - Continue with salt water rinses as needed.   3. Oral lesion - Definitely make an appointment again with ENT. - I think that is going to be the ticket to see what is going on in your throat.   4. Nickel allergy - Consider doing the patch test.   5. Follow up as needed (I am happy to see you again if needed).    Please inform us of any Emergency Department visits, hospitalizations, or changes in symptoms. Call us before going to the ED for breathing or allergy symptoms since we might be able to fit you in for a sick visit. Feel free to contact us anytime with any questions, problems, or concerns.  It was a pleasure to see you again today!  Websites that have reliable patient information: 1. American Academy of Asthma, Allergy, and Immunology: www.aaaai.org 2. Food Allergy Research and Education (FARE): foodallergy.org 3. Mothers of Asthmatics: http://www.asthmacommunitynetwork.org 4. American College of Allergy, Asthma, and Immunology:  www.acaai.org      "Like" [place] on Facebook and Instagram for our latest updates!      A healthy democracy works best when [employer] participate! Make sure you are registered to vote! If you have moved or changed any of your contact information, you will need to get this updated before voting! Scan the QR codes below to learn more!              Subjective:   Katherine Cruz is a 58 y.o. female presenting today for follow up of  Chief Complaint  Patient presents with  . Perennial allergic rhinitis  . Bloated  . Follow-up    Much better from last visit.     Katherine Cruz has a history of the following: Patient Active Problem List   Diagnosis Date Noted  . Chronic fatigue 05/20/2023  . Vitamin D deficiency 05/20/2023  . Pelvic congestion syndrome 05/20/2023  . Pharyngitis 04/04/2023  . Aortic atherosclerosis (HCC) 10/23/2022  . Former cigarette smoker 09/05/2022  . Elevated TSH 09/05/2022  . Screening mammogram for breast cancer 09/05/2022  . Multiple gallstones 04/04/2021  . Estrogen deficiency 12/18/2020  . Colon cancer screening 12/18/2020  . Visit for screening mammogram 12/18/2020  . MVP (mitral valve prolapse) 12/18/2020  . Other emphysema (HCC) 12/18/2020  . GAD (generalized anxiety disorder) 12/18/2020  . Encounter for general adult medical examination with abnormal findings 12/18/2020  . Need for shingles vaccine 12/15/2019  . Mixed hyperlipidemia 12/03/2019  . Onychomycosis 12/03/2019  . Tremor  of both hands 11/28/2019  . Generalized osteoarthritis of multiple sites 01/03/2014  . Atopic rhinitis 11/24/2009    History obtained from: chart review and {Persons; PED relatives w/patient:19415::"patient"}.  Discussed the use of AI scribe software for clinical note transcription with the patient and/or guardian, who gave verbal consent to proceed.  Mayli is a 58 y.o. female presenting for {Blank single:19197::"a food challenge","a drug challenge","skin  testing","a sick visit","an evaluation of ***","a follow up visit"}. She was last seen in January 28th.  At that time, she had testing that was very reactive to egg which we thought was not relevant since she is a vegan.  Testing was slightly reactive to tomato, ginger, and mushrooms.  We obtained blood work to look for food allergies that we cannot test on the skin.  For her rhinitis, we obtain blood work to look for that.  She had an oral lesion but she already had an orthodontist appointment set up.  Her follow-up labs showed an environmental allergy panel that was positive to dust mites as well as dog and molds.  Her yeast was barely positive at 0.12.  Cumin, coffee, spinach, and all overall negative.  She was scheduled for patch testing, but seems to have canceled that.  Discussed the use of AI scribe software for clinical note transcription with the patient, who gave verbal consent to proceed.  History of Present Illness     Asthma/Respiratory Symptom History: ***  Allergic Rhinitis Symptom History: ***  Food Allergy Symptom History: ***  Skin Symptom History: ***  GERD Symptom History: ***  Infection Symptom History: ***  Otherwise, there have been no changes to her past medical history, surgical history, family history, or social history.    Review of systems otherwise negative other than that mentioned in the HPI.    Objective:   Blood pressure 104/62, pulse 68, temperature 98 F (36.7 C), temperature source Temporal, last menstrual period 03/24/2015, SpO2 98%. There is no height or weight on file to calculate BMI.    Physical Exam   Diagnostic studies: {Blank single:19197::"none","deferred due to recent antihistamine use","deferred due to insurance stipulations that require a separate visit for testing","labs sent instead"," "}  Spirometry: {Blank single:19197::"results normal (FEV1: ***%, FVC: ***%, FEV1/FVC: ***%)","results abnormal (FEV1: ***%, FVC: ***%,  FEV1/FVC: ***%)"}.    {Blank single:19197::"Spirometry consistent with mild obstructive disease","Spirometry consistent with moderate obstructive disease","Spirometry consistent with severe obstructive disease","Spirometry consistent with possible restrictive disease","Spirometry consistent with mixed obstructive and restrictive disease","Spirometry uninterpretable due to technique","Spirometry consistent with normal pattern"}. {Blank single:19197::"Albuterol/Atrovent nebulizer","Xopenex/Atrovent nebulizer","Albuterol nebulizer","Albuterol four puffs via MDI","Xopenex four puffs via MDI"} treatment given in clinic with {Blank single:19197::"significant improvement in FEV1 per ATS criteria","significant improvement in FVC per ATS criteria","significant improvement in FEV1 and FVC per ATS criteria","improvement in FEV1, but not significant per ATS criteria","improvement in FVC, but not significant per ATS criteria","improvement in FEV1 and FVC, but not significant per ATS criteria","no improvement"}.  Allergy Studies: {Blank single:19197::"none","deferred due to recent antihistamine use","deferred due to insurance stipulations that require a separate visit for testing","labs sent instead"," "}    {Blank single:19197::"Allergy testing results were read and interpreted by myself, documented by clinical staff."," "}      Malachi Bonds, MD  [facility]

## 2023-08-29 ENCOUNTER — Encounter: Payer: Self-pay | Admitting: Allergy & Immunology

## 2023-09-08 ENCOUNTER — Telehealth: Payer: Self-pay | Admitting: Allergy & Immunology

## 2023-09-08 NOTE — Telephone Encounter (Signed)
 Yes she is willing to go this time. She promises to follow through.

## 2023-09-08 NOTE — Telephone Encounter (Signed)
 Pt called and wanted information about her ENT referral.

## 2023-09-11 ENCOUNTER — Encounter: Payer: Self-pay | Admitting: Pulmonary Disease

## 2023-09-11 ENCOUNTER — Ambulatory Visit: Payer: 59 | Admitting: Pulmonary Disease

## 2023-09-11 VITALS — BP 110/68 | HR 80 | Temp 97.7°F | Ht 64.0 in | Wt 117.4 lb

## 2023-09-11 DIAGNOSIS — R911 Solitary pulmonary nodule: Secondary | ICD-10-CM

## 2023-09-11 DIAGNOSIS — J439 Emphysema, unspecified: Secondary | ICD-10-CM

## 2023-09-11 DIAGNOSIS — F419 Anxiety disorder, unspecified: Secondary | ICD-10-CM | POA: Diagnosis not present

## 2023-09-11 DIAGNOSIS — Z87891 Personal history of nicotine dependence: Secondary | ICD-10-CM | POA: Diagnosis not present

## 2023-09-11 DIAGNOSIS — R0602 Shortness of breath: Secondary | ICD-10-CM | POA: Diagnosis not present

## 2023-09-11 DIAGNOSIS — Z7722 Contact with and (suspected) exposure to environmental tobacco smoke (acute) (chronic): Secondary | ICD-10-CM

## 2023-09-11 NOTE — Progress Notes (Signed)
 Katherine Cruz    [id number]    12-10-1965  Primary Care Physician:Jones, Randa Burton, MD  Referring Physician: Arcadio Knuckles, MD [address] [place],  [place] [address]  Chief complaint: Follow-up for dyspnea  HPI: 58 y.o.  with history of palpitations, hyperlipidemia, allergies, fibromyalgia, osteoarthritis, emphysema  Seen here for evaluation of intermittent shortness of breath.  She does not have symptoms with activity.  Has paroxysmal dyspnea while at rest which may be associated with anxiety.  She forces her to calm down and breathe during these episodes.  She has been sent here for evaluation of her lung given her smoking history. She also reports episodes of waking up in the night with lower chest discomfort and tightness. She had BCG as a child and has history of positive PPD.  Had been told at some point that she has old granulomatous disease.  She follows with Dr. Ganji for palpitations with a work-up as below.  It was noted on her stress test that she has low exercise capacity thought to be secondary to deconditioning.  She also has history of perennial allergies, no history of acid reflux Previously seen by Dr. Bennetta Braun in 2012 for OSA.  She had a sleep study which did not show any evidence of sleep apnea.  AHI was essentially 0.  Denies any snoring, nighttime awakenings  Pets: No pets Occupation: Works as a Production designer, theatre/television/film, Engineer, site. Exposures: No known exposures, no mold, hot tub Smoking history: 35-pack-year smoker.  Quit in 2013 Travel history: Originally from the [place], [place].  Lived in [place] , [place]  [place]  Relevant family history: No significant family history of lung issues.  Interim history: Discussed the use of AI scribe software for clinical note transcription with the patient, who gave verbal consent to proceed.  History of Present Illness Katherine Cruz is a 58 year old female with emphysema who presents for follow-up of  emphysema, a lung nodule and shortness of breath.  She has a history of emphysema and is a former smoker, having quit in 2013 after smoking for approximately 30 years. A CT scan a year ago revealed a lung nodule, prompting this follow-up visit. She is concerned about the progression of her emphysema and the status of the nodule.  She experiences shortness of breath both at rest and with activity, noting it occurs 'almost always' with activities such as hiking. Her shortness of breath worsens when she is nervous, which she attributes to anxiety. Despite these symptoms, her oxygen levels have been consistently good, often at 100% during recent visits.  Her past medical history includes mitral valve prolapse and a tricuspid valve issue, which she believes may contribute to her symptoms. A pulmonary function test in 2019 showed normal lung function with a lung capacity of 111%.  She follows a vegan and organic lifestyle, avoiding potential lung irritants such as candles and incense. 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   Physical Exam: Blood pressure 110/68, pulse 80, temperature 97.7 F (36.5 C), temperature source Temporal, height 5\' 4"  (1.626 m), weight 117 lb 6.4 oz (53.3 kg), last menstrual period 03/24/2015, SpO2 99%. Gen:      No acute distress HEENT:  EOMI, sclera anicteric Neck:     No masses; no thyromegaly Lungs:    Clear to  auscultation bilaterally; normal respiratory effort CV:         Regular rate and rhythm; no murmurs Abd:      + bowel sounds; soft, non-tender; no palpable masses, no distension Ext:    No edema; adequate peripheral perfusion Skin:      Warm and dry; no rash Neuro: alert and oriented x 3 Psych: normal mood and affect   Data Reviewed: Imaging Chest x-ray  01/02/2018-possible nodule in the left lower lobe CT scan 01/16/2018- no evidence of left lower lobe lung nodule.  No active cardia pulmonary disease. Low-dose screening CT 10/04/2022-subtle changes of emphysema, tiny pulmonary nodules measuring 2.3 mm Cardiac CT 11/20/2022-visualized lungs showed no abnormality I reviewed the images personally.  PFTs  02/02/2018 FVC 3.41 (1 5%), FEV1 3.68 [111%], F/F 79, TLC 105%, DLCO 114% Normal test  FENO 02/02/18-15  Data from cardiology Echocardiogram 07/28/2020-LVEF 55-60%, normal LV function, mild MR, mild TR  Sleep study 2011-negative for sleep apnea  Treadmill stress test 10/07/2016- no evidence of ischemia.  Exercise tolerance is markedly reduced for age.  Consider evaluation for noncardiac etiology of dyspnea.  Labs CBC 01/02/18-WBC 4.6, eos 1.9%, absolute eosinophil count 87 Blood allergy  profile 01/02/18-IgE 131, mild allergies to dust mite, dog Assessment & Plan Shortness of breath Shortness of breath with exertion and occasionally at rest, potentially due to mild emphysema, anxiety, and cardiac issues (mitral valve prolapse, tricuspid valve issues). Oxygen saturation remains good. Lung function test needed to assess changes since 2019. - Order pulmonary function test to assess lung function  Emphysema Mild emphysema consistent with smoking history, noted on CT scan. Lung function test in 2019 showed 111% lung capacity. Emphysema is not expected to worsen significantly due to smoking cessation in 2013. Annual CT scans show well-managed emphysema. She avoids lung irritants. - Annual screening CT already ordered for May 2025.  Patient instructed to call back if she does not hear from the scheduler within 1 month. - Order pulmonary function test to assess current lung function  Lung nodule 2.3 mm nodule noted a year ago. Monitoring for changes in nodule size and emphysema progression with annual CT scans. - Annual screening CT for May 2025 to  monitor nodule and emphysema  Anxiety Anxiety contributing to shortness of breath, especially when nervous.   Plan/Recommendations: - PFTs, follow-up screening CT chest  Phyllis Breeze MD [facility] 02/02/2018, 4:12 PM CC: Arcadio Knuckles, MD

## 2023-09-11 NOTE — Patient Instructions (Signed)
 VISIT SUMMARY:  Today, we discussed your ongoing issues with shortness of breath, emphysema, and a lung nodule. We also talked about how your anxiety might be contributing to your symptoms. We reviewed your history of smoking, your current lifestyle, and your past medical history, including your heart valve issues. We have planned some tests to better understand your current lung function and to monitor the lung nodule.  YOUR PLAN:  -SHORTNESS OF BREATH: Your shortness of breath occurs both at rest and with activity, and it may be due to mild emphysema, anxiety, and your heart valve issues. We will order a pulmonary function test to see if there have been any changes in your lung function since 2019.  -EMPHYSEMA: Emphysema is a lung condition that causes shortness of breath. It is consistent with your history of smoking, but it is not expected to worsen significantly since you quit smoking in 2013. We will continue to monitor your condition with an annual CT scan and a pulmonary function test.  -LUNG NODULE: A lung nodule is a small growth in the lung. We found a 2.3 cm nodule a year ago, and we need to keep an eye on it to see if it changes in size. We will do this with an annual CT scan.  -ANXIETY: Anxiety can make your shortness of breath worse, especially when you are nervous. Managing your anxiety may help improve your breathing.  INSTRUCTIONS:  Please schedule a pulmonary function test to assess your current lung function. Also, schedule your annual CT scan for May 2025 to monitor your lung nodule and emphysema. Continue to avoid lung irritants and manage your anxiety to help with your breathing.

## 2023-09-17 NOTE — Telephone Encounter (Signed)
 Katherine Cruz has been scheduled with [facility] for 09/26/23 at 9:00 am with Dr. Soldatova.

## 2023-09-18 ENCOUNTER — Ambulatory Visit (INDEPENDENT_AMBULATORY_CARE_PROVIDER_SITE_OTHER): Admitting: Obstetrics and Gynecology

## 2023-09-18 ENCOUNTER — Encounter: Payer: Self-pay | Admitting: Obstetrics and Gynecology

## 2023-09-18 ENCOUNTER — Telehealth: Payer: Self-pay | Admitting: Internal Medicine

## 2023-09-18 VITALS — BP 114/70 | HR 73 | Ht 63.0 in | Wt 119.0 lb

## 2023-09-18 DIAGNOSIS — N819 Female genital prolapse, unspecified: Secondary | ICD-10-CM | POA: Diagnosis not present

## 2023-09-18 DIAGNOSIS — N3941 Urge incontinence: Secondary | ICD-10-CM | POA: Diagnosis not present

## 2023-09-18 DIAGNOSIS — E2839 Other primary ovarian failure: Secondary | ICD-10-CM | POA: Diagnosis not present

## 2023-09-18 DIAGNOSIS — Z202 Contact with and (suspected) exposure to infections with a predominantly sexual mode of transmission: Secondary | ICD-10-CM | POA: Diagnosis not present

## 2023-09-18 MED ORDER — INTRAROSA 6.5 MG VA INST: 1.0000 | VAGINAL_INSERT | Freq: Every evening | VAGINAL | 12 refills | Status: DC | PRN

## 2023-09-18 NOTE — Progress Notes (Signed)
   Acute Office Visit  Subjective:    Patient ID: Katherine Cruz, female    DOB: 09-22-1965, 58 y.o.   MRN: [id number]   HPI 58 y.o. presents today for NGYN (NGYN, breast & pelvic exam if enough time & pelvic congestion syndrome//jj/PAP: yrs ago, MMG: 10-31-22 & left breast u/s 10-31-22, BMD: 01-26-21, COLOGUARD: maybe 3 yrs ago) .dxa 2022 with osteopenia  Had dysmenorrhea with her periods. No VB Reports severe vaginal dryness.  No HRT and likes natural route for treatments  Patient's last menstrual period was 03/24/2015 (approximate).   Reports possible labial separation procedure in [place] and has Bi directional voids when at rest stops and avoids voiding. Unsure if this is related to that procedure. Some splinting and urgency to void Does not have to wear a pad Feels heaving her pelvis and can feel a buldge Has seen IVR for PCS and does not want the beads at this time. She also reports about a year ago at [employer] she went for dental cleaning and the assistant used tools from her bag for her dental cleaning and she would like STI testing.   Review of Systems     Objective:    Physical Exam Constitutional:      Appearance: Normal appearance.  Genitourinary:     Genitourinary Comments: Slight cystocele early to grade 1 Normal urethra Normal labia     Anterior vaginal prolapse present.    Moderate vaginal atrophy present.    No cervical discharge.  Musculoskeletal:     Cervical back: Normal range of motion.  Neurological:     Mental Status: She is alert.     BP 114/70   Pulse 73   Ht 5\' 3"  (1.6 m)   Wt 119 lb (54 kg)   LMP 03/24/2015 (Approximate)   SpO2 99%   BMI 21.08 kg/m  Wt Readings from Last 3 Encounters:  09/18/23 119 lb (54 kg)  09/11/23 117 lb 6.4 oz (53.3 kg)  08/12/23 115 lb (52.2 kg)       Joy, CNA was present for entire exam  Assessment & Plan:  dPCS Urgency Loss of pelvic support  Referral to urogyn for evaluation and treatment  management.  Counseled on the importance of estrogen support.  Can try natural support with DHEA and she can continue this for long term. Discussed insurance may not cover.  Referral placed to pelvic PT as well RTC for annual exam Referral placed for dxa scan  Reinaldo Caras

## 2023-09-18 NOTE — Telephone Encounter (Signed)
 Patient has been on doxycycline  (VIBRAMYCIN ) 50 MG capsule for almost three months per her endodontist. She said she has been given conflicting advise regarding this medication and would like a call to discuss. Best callback is [phone].

## 2023-09-19 ENCOUNTER — Telehealth: Payer: Self-pay

## 2023-09-19 ENCOUNTER — Other Ambulatory Visit: Payer: Self-pay

## 2023-09-19 LAB — HEPATITIS B SURFACE ANTIGEN: Hepatitis B Surface Ag: NONREACTIVE

## 2023-09-19 LAB — HIV ANTIBODY (ROUTINE TESTING W REFLEX): HIV 1&2 Ab, 4th Generation: NONREACTIVE

## 2023-09-19 LAB — HEPATITIS C ANTIBODY: Hepatitis C Ab: NONREACTIVE

## 2023-09-19 LAB — RPR: RPR Ser Ql: NONREACTIVE

## 2023-09-19 NOTE — Telephone Encounter (Signed)
 This was handled over the phone with Dr. Rochelle Chu while in the office.

## 2023-09-19 NOTE — Telephone Encounter (Signed)
 Prior authorization done for intrarosa  6.5 vaginal insert. Patient is aware it could take several days for a response. Key: Katherine Cruz

## 2023-09-22 NOTE — Telephone Encounter (Signed)
 Katherine Cruz cancelled her 5/9 appointment and rescheduled for 11/25/23 at 11:00 am with Dr. Saldotova

## 2023-09-23 ENCOUNTER — Encounter: Payer: Self-pay | Admitting: Obstetrics and Gynecology

## 2023-09-23 NOTE — Addendum Note (Signed)
 Addended by: Reinaldo Caras on: 09/23/2023 09:18 AM   Modules accepted: Level of Service

## 2023-09-23 NOTE — Telephone Encounter (Signed)
 Patient notified that Intrarosa  6.5mg  insert was denied by her insurance after PA was done. I gave her the number to [employer]scripts to call them so they can use the coupon for her.

## 2023-09-26 ENCOUNTER — Institutional Professional Consult (permissible substitution) (INDEPENDENT_AMBULATORY_CARE_PROVIDER_SITE_OTHER): Admitting: Otolaryngology

## 2023-10-08 ENCOUNTER — Telehealth: Payer: Self-pay | Admitting: Acute Care

## 2023-10-08 ENCOUNTER — Other Ambulatory Visit

## 2023-10-08 NOTE — Telephone Encounter (Signed)
 Copied from CRM [id number]. Topic: General - Other >> Oct 08, 2023 11:33 AM Dyann Glaser G wrote: Reason for CRM: PT CALLED STATED SHE HAD TO CANCEL HER CT SCAN TODAY AND WOULD LIKE TO [facility]

## 2023-10-09 ENCOUNTER — Other Ambulatory Visit: Payer: Self-pay | Admitting: Acute Care

## 2023-10-09 ENCOUNTER — Other Ambulatory Visit (HOSPITAL_BASED_OUTPATIENT_CLINIC_OR_DEPARTMENT_OTHER)

## 2023-10-09 DIAGNOSIS — Z87891 Personal history of nicotine dependence: Secondary | ICD-10-CM

## 2023-10-09 DIAGNOSIS — Z122 Encounter for screening for malignant neoplasm of respiratory organs: Secondary | ICD-10-CM

## 2023-10-16 ENCOUNTER — Other Ambulatory Visit (HOSPITAL_BASED_OUTPATIENT_CLINIC_OR_DEPARTMENT_OTHER)

## 2023-10-17 ENCOUNTER — Ambulatory Visit: Admitting: Obstetrics and Gynecology

## 2023-10-21 ENCOUNTER — Telehealth: Payer: Self-pay

## 2023-10-21 ENCOUNTER — Other Ambulatory Visit (HOSPITAL_BASED_OUTPATIENT_CLINIC_OR_DEPARTMENT_OTHER)

## 2023-10-21 NOTE — Telephone Encounter (Signed)
 Copied from CRM [id number]. Topic: Clinical - Medical Advice >> Oct 17, 2023  3:04 PM Katherine Cruz wrote: Reason for CRM:   Pt is contacting clinic concerning her CT scan scheduled for 06/04. She is wanting to reschedule the scan but would like advise concerning her radiation exposure.  She had a root canal that has caused more serious issues, so she is following up with her orthodontist on 06/3 to have x-rays performed to assess for complications. She also has a bone density scan scheduled this day.   She is concerned with having several tests performed so close together that require radiation, and would like advise concerning the timing of her testing. She would like to know if she should space the testing out further apart to prevent over exposure to radiation.   Requests call back # [phone]   Katherine Cruz can you advise some guidance on this as CT is scheduled for 6/4 and PM is on vacation. Thank you

## 2023-10-22 ENCOUNTER — Ambulatory Visit

## 2023-10-24 ENCOUNTER — Encounter: Payer: Self-pay | Admitting: Allergy & Immunology

## 2023-10-24 DIAGNOSIS — T783XXA Angioneurotic edema, initial encounter: Secondary | ICD-10-CM

## 2023-10-24 DIAGNOSIS — R140 Abdominal distension (gaseous): Secondary | ICD-10-CM

## 2023-10-30 ENCOUNTER — Ambulatory Visit: Payer: 59 | Admitting: Cardiology

## 2023-11-14 ENCOUNTER — Ambulatory Visit: Payer: Self-pay | Admitting: Allergy & Immunology

## 2023-11-14 DIAGNOSIS — T783XXD Angioneurotic edema, subsequent encounter: Secondary | ICD-10-CM

## 2023-11-14 DIAGNOSIS — R768 Other specified abnormal immunological findings in serum: Secondary | ICD-10-CM

## 2023-11-14 LAB — C1 ESTERASE INHIBITOR: C1INH SerPl-mCnc: 37 mg/dL (ref 21–39)

## 2023-11-14 LAB — C1 ESTERASE INHIBITOR, FUNCTIONAL: C1INH Functional/C1INH Total MFr SerPl: 101 %{normal}

## 2023-11-14 LAB — C3 AND C4
Complement C3, Serum: 120 mg/dL (ref 82–167)
Complement C4, Serum: 28 mg/dL (ref 12–38)

## 2023-11-14 LAB — TRYPTASE: Tryptase: 5.6 ug/L (ref 2.2–13.2)

## 2023-11-14 LAB — COMPLEMENT COMPONENT C1Q: Complement C1Q: 15.3 mg/dL (ref 10.3–20.5)

## 2023-11-20 ENCOUNTER — Ambulatory Visit

## 2023-11-25 ENCOUNTER — Encounter (INDEPENDENT_AMBULATORY_CARE_PROVIDER_SITE_OTHER): Payer: Self-pay | Admitting: Otolaryngology

## 2023-11-25 ENCOUNTER — Ambulatory Visit (INDEPENDENT_AMBULATORY_CARE_PROVIDER_SITE_OTHER): Admitting: Otolaryngology

## 2023-11-25 VITALS — BP 96/47 | HR 67

## 2023-11-25 DIAGNOSIS — R490 Dysphonia: Secondary | ICD-10-CM

## 2023-11-25 DIAGNOSIS — K143 Hypertrophy of tongue papillae: Secondary | ICD-10-CM

## 2023-11-25 DIAGNOSIS — K219 Gastro-esophageal reflux disease without esophagitis: Secondary | ICD-10-CM | POA: Diagnosis not present

## 2023-11-25 DIAGNOSIS — R09A2 Foreign body sensation, throat: Secondary | ICD-10-CM | POA: Diagnosis not present

## 2023-11-25 NOTE — Progress Notes (Signed)
 Dear Dr. Iva, Here is my assessment for our mutual patient, Katherine Cruz. Thank you for allowing me the opportunity to care for your patient. Please do not hesitate to contact me should you have any other questions. Sincerely, Dr. Eldora Blanch  Otolaryngology Clinic Note Referring provider: Dr. Iva HPI:  Katherine Cruz is a 58 y.o. female kindly referred by Dr. Iva for evaluation of throat issues  Initial visit (11/2023): Patient reports: Over last 2-3 months, feels like she can feel a lump in her throat on the left. Sometimes hurts to talk and breathe in. She feels like her throat is very dry. Symptoms come and go --- worse when throat is dry, and with voice use. Better with rest (morning I do not feel it). She has tried gargling with saltwater, vitamin C. No voice change.  She was diagnosed with GERD - but does not notice it symptomatically. Not taking anything for reflux. She is currently on amoxicillin , and has had abx before for this and dental work - does not help.   She does report that she has had dental issues and just had a root canal which did not help so just underwent a procedure to save the bridge. No oral lesions she can note. Does eat mostly soft foods (recently - given dental work)  Patient otherwise denies: - dysphagia, odynophagia, aspiration episodes or PNA, need for Heimlich, unintentional weight loss - changes in voice, hemoptysis - otalgia, neck masses  ENT Surgery: no Personal or FHx of bleeding dz or anesthesia difficulty: no  AP/AC: no  Tobacco: former, 30 pack year  PMHx: MDD/GAD/PTSD, Palpitations, GERD, MVP  Independent Review of Additional Tests or Records:  Dr. Iva (08/28/2023): noted history of allergic rhinitis; persistent sore in mouth, eventually an abscess; noted dental issues as well; feeling of something stuck in throat, recent root canal; Dx: oral lesion? Rx: ref to ENT CBC/TSH/BMP: 05/20/2023: BUN/Cr wnl; WBC 4.4, Eos 100;  TSH 3.69 PMH/Meds/All/SocHx/FamHx/ROS:   Past Medical History:  Diagnosis Date   Allergic rhinitis    Allergy     Brain fog    Breast mass 08/23/2010   Depression    Eating disorder    Emphysema of lung (HCC)    Fibrocystic breast    Fibromyalgia    Fibromyalgia    GERD (gastroesophageal reflux disease)    Heart murmur    Hyperlipidemia    MVP (mitral valve prolapse)    Osteoarthritis    Ovarian cyst    PAC (premature atrial contraction)    Pelvic congestion syndrome    Positive PPD    PTSD (post-traumatic stress disorder)    PVC (premature ventricular contraction)    STD (sexually transmitted disease)    chl treated yrs ago   UTI (urinary tract infection)    UTI (urinary tract infection)      Past Surgical History:  Procedure Laterality Date   CERVICAL POLYPECTOMY     IR RADIOLOGIST EVAL & MGMT  06/19/2023   MAXILLARY SINUS LIFT     REPLANTATION THUMB     SINUS LIFT WITH BONE GRAFT     TUBAL LIGATION      Family History  Problem Relation Age of Onset   Arthritis Mother    Hearing loss Mother    Mental illness Mother    Tremor Mother    Alcohol abuse Father    Drug abuse Sister    Mental illness Sister    Parkinson's disease Maternal Grandmother    Mental illness Maternal  Grandmother    Drug abuse Maternal Grandmother      Social Connections: Moderately Isolated (08/12/2023)   Social Connection and Isolation Panel    Frequency of Communication with Friends and Family: More than three times a week    Frequency of Social Gatherings with Friends and Family: Three times a week    Attends Religious Services: Never    Active Member of Clubs or Organizations: Yes    Attends Banker Meetings: 1 to 4 times per year    Marital Status: Divorced      Current Outpatient Medications:    amoxicillin  (AMOXIL ) 875 MG tablet, Take 875 mg by mouth 2 (two) times daily., Disp: , Rfl:    doxycycline  (VIBRAMYCIN ) 50 MG capsule, Take 50 mg by mouth daily.  (Patient not taking: Reported on 11/25/2023), Disp: , Rfl:    Prasterone  (INTRAROSA ) 6.5 MG INST, Place 1 suppository vaginally at bedtime as needed. (Patient not taking: Reported on 11/25/2023), Disp: 30 each, Rfl: 12   Physical Exam:   BP (!) 96/47   Pulse 67   LMP 03/24/2015 (Approximate)   SpO2 98%   Salient findings:  CN II-XII intact Bilateral EAC clear and TM intact with well pneumatized middle ear spaces Anterior rhinoscopy: Septum relatively midline; bilateral inferior turbinates without significant hypertrophy No lesions of oral cavity/oropharynx except for some hairy changes/discoloration consistent with black hairy tongue; no palpable tongue base masses No obviously palpable neck masses/lymphadenopathy/thyromegaly - the area where she reports she can feel the lump appears to be over hyoid cornua on left No respiratory distress or stridor; TFL was indicated to better evaluate the proximal airway, given the patient's history and exam findings, and is detailed below.  Seprately Identifiable Procedures:  Prior to initiating any procedures, risks/benefits/alternatives were explained to the patient and verbal consent obtained. Procedure Note Pre-procedure diagnosis: Globus sensation, LPR/GERD Post-procedure diagnosis: Same Procedure: Transnasal Fiberoptic Laryngoscopy, CPT 31575 - Mod 25 Indication: see above Complications: None apparent EBL: 0 mL  The procedure was undertaken to further evaluate the patient's complaint above, with mirror exam inadequate for appropriate examination due to gag reflex and poor patient tolerance  Procedure:  Patient was identified as correct patient. Verbal consent was obtained. The nose was sprayed with oxymetazoline and 4% lidocaine. The The flexible laryngoscope was passed through the nose to view the nasal cavity, pharynx (oropharynx, hypopharynx) and larynx.  The larynx was examined at rest and during multiple phonatory tasks. Documentation was  obtained and reviewed with patient. The scope was removed. The patient tolerated the procedure well.  Findings: The nasal cavity and nasopharynx did not reveal any masses or lesions, mucosa appeared to be without obvious lesions. The tongue base, pharyngeal walls, piriform sinuses, vallecula, epiglottis and postcricoid region are normal in appearance - unable to appreciate any foreign bodies or lesions. No evidence of granuloma. The visualized portion of the subglottis and proximal trachea is widely patent. The vocal folds are mobile bilaterally. There are no lesions on the free edge of the vocal folds nor elsewhere in the larynx worrisome for malignancy.  Mild compression suggestive of MTD with small posterior glottic chink     Electronically signed by: Eldora KATHEE Blanch, MD 11/25/2023 12:26 PM   Impression & Plans:  Katherine Cruz is a 58 y.o. female with:  1. Globus sensation   2. Laryngopharyngeal reflux (LPR)   3. Muscle tension dysphonia   4. Black hairy tongue    We had an in depth discussion about her  issues: her TFL is reassuring and appears to be most likely related to LPR or perhaps MTD. Worse when throat is dry. We discussed options: laryngeal massage, improved hydration, alginate therapy, voice Rx and further workup with CT. She would like to try massage and Reflux gourmet first which is reasonable Black hairy tongue: oral findings consistent with this - encouraged improved oral care and eating crunchy foods and brushing tongue. She understood  F/u in 3 months - sooner as necessary; if no improvement, can consider CT neck.   See below regarding exact medications prescribed this encounter including dosages and route: No orders of the defined types were placed in this encounter.     Thank you for allowing me the opportunity to care for your patient. Please do not hesitate to contact me should you have any other questions.  Sincerely, Eldora Blanch, MD Otolaryngologist (ENT), [facility]  [facility] Phone: [phone] Fax: [phone]  11/25/2023, 12:26 PM   I have personally spent 47 minutes involved in face-to-face and non-face-to-face activities for this patient on the day of the visit.  Professional time spent excludes any procedures performed but includes the following activities, in addition to those noted in the documentation: preparing to see the patient (review of outside documentation and results), performing a medically appropriate examination, extensive counseling, documenting in the electronic health record

## 2023-11-27 ENCOUNTER — Ambulatory Visit: Admitting: Obstetrics and Gynecology

## 2023-12-10 ENCOUNTER — Encounter (INDEPENDENT_AMBULATORY_CARE_PROVIDER_SITE_OTHER): Payer: Self-pay | Admitting: Otolaryngology

## 2023-12-16 ENCOUNTER — Ambulatory Visit: Admitting: Pulmonary Disease

## 2023-12-16 ENCOUNTER — Ambulatory Visit (INDEPENDENT_AMBULATORY_CARE_PROVIDER_SITE_OTHER): Admitting: Pulmonary Disease

## 2023-12-16 ENCOUNTER — Encounter: Payer: Self-pay | Admitting: Pulmonary Disease

## 2023-12-16 VITALS — BP 116/70 | HR 75 | Ht 63.0 in | Wt 120.0 lb

## 2023-12-16 DIAGNOSIS — J439 Emphysema, unspecified: Secondary | ICD-10-CM

## 2023-12-16 DIAGNOSIS — Z87891 Personal history of nicotine dependence: Secondary | ICD-10-CM

## 2023-12-16 DIAGNOSIS — R911 Solitary pulmonary nodule: Secondary | ICD-10-CM | POA: Diagnosis not present

## 2023-12-16 LAB — PULMONARY FUNCTION TEST
DL/VA % pred: 109 %
DL/VA: 4.66 ml/min/mmHg/L
DLCO unc % pred: 118 %
DLCO unc: 23.64 ml/min/mmHg
FEF 25-75 Pre: 2.84 L/s
FEF2575-%Pred-Pre: 119 %
FEV1-%Pred-Pre: 112 %
FEV1-Pre: 2.84 L
FEV1FVC-%Pred-Pre: 102 %
FEV6-%Pred-Pre: 112 %
FEV6-Pre: 3.55 L
FEV6FVC-%Pred-Pre: 103 %
FVC-%Pred-Pre: 108 %
FVC-Pre: 3.56 L
Pre FEV1/FVC ratio: 80 %
Pre FEV6/FVC Ratio: 100 %
RV % pred: 106 %
RV: 2.07 L
TLC % pred: 109 %
TLC: 5.47 L

## 2023-12-16 NOTE — Progress Notes (Signed)
 Katherine Cruz    [id number]    1965-06-07  Primary Care Physician:Jones, Debby CROME, MD  Referring Physician: Olman Yono, MD [address] [place],  [place] [address]  Chief complaint: Follow-up for emphysema, lung nodule  HPI: 58 y.o.  with history of palpitations, hyperlipidemia, allergies, fibromyalgia, osteoarthritis, emphysema  Seen here for evaluation of intermittent shortness of breath.  She does not have symptoms with activity.  Has paroxysmal dyspnea while at rest which may be associated with anxiety.  She forces her to calm down and breathe during these episodes.  She has been sent here for evaluation of her lung given her smoking history. She also reports episodes of waking up in the night with lower chest discomfort and tightness. She had BCG as a child and has history of positive PPD.  Had been told at some point that she has old granulomatous disease.  She follows with Dr. Ganji for palpitations with a work-up as below.  It was noted on her stress test that she has low exercise capacity thought to be secondary to deconditioning.  She also has history of perennial allergies, no history of acid reflux Previously seen by Dr. Corrie in 2012 for OSA.  She had a sleep study which did not show any evidence of sleep apnea.  AHI was essentially 0.  Denies any snoring, nighttime awakenings  Interim history: Discussed the use of AI scribe software for clinical note transcription with the patient, who gave verbal consent to proceed.  History of Present Illness Katherine Cruz is a 58 year old female with emphysema and a lung nodule who presents for follow-up evaluation. She is accompanied by her friend, Madeleine.  Pulmonary symptoms and emphysema - Emphysema with concern for disease progression based on recent report - Lung nodule measuring approximately 2.3 millimeters, last monitored by CT in May 2024; due for repeat imaging - Avoids exposure to smoke and perfumes to  preserve lung function - Quit smoking in 2013 after smoking since age 27 - Concern about preserving lung function  Concern for Sjogren's disease and associated symptoms - She thinks she has Sjogren's disease with symptoms including dry eyes, dry skin, and digestive issues - Wakes with bulging eyes - Corneal scar causing severe pain when eyes are dry - Sharp pleuritic pain with deep inspiration in certain positions, suspected to be related to Sjogren's involvement of the lungs - Recent laboratory findings include thyroid  antibodies and mildly positive ANA  Fibromyalgia - History of fibromyalgia  Depressive symptoms - Struggling with depression, worsened after her mother's passing one month ago - Difficulty leaving the house - Engaged in therapy for mental health support   Relevant pulmonary History Pets: No pets Occupation: Works as a Production designer, theatre/television/film, Engineer, site. Exposures: No known exposures, no mold, hot tub Smoking history: 35-pack-year smoker.  Quit in 2013 Travel history: Originally from the [place], [place].  Lived in [place] , [place]  [place]  Relevant family history: No significant family history of lung issues.  Physical Exam: Gen:      No acute distress HEENT:  EOMI, sclera anicteric Neck:     No masses; no thyromegaly Lungs:    Clear to auscultation bilaterally; normal respiratory effort CV:         Regular rate and rhythm; no murmurs Abd:      + bowel sounds; soft, non-tender; no palpable masses, no distension Ext:    No edema; adequate peripheral perfusion Neuro: alert and oriented  x 3 Psych: normal mood and affect   Data Reviewed: Imaging Chest x-ray 01/02/2018-possible nodule in the left lower lobe CT scan 01/16/2018- no evidence of left lower lobe lung nodule.  No active cardia pulmonary disease. Low-dose screening CT 10/04/2022-subtle changes of emphysema, tiny pulmonary nodules measuring 2.3 mm Cardiac CT 11/20/2022-visualized lungs showed no  abnormality CT abdomen pelvis 05/06/2023-visualized lungs are clear I reviewed the images personally.  PFTs  02/02/2018 FVC 3.41 (1 5%), FEV1 3.68 [111%], F/F 79, TLC 105%, DLCO 114% Normal test  12/16/2023 FVC 3.56 [108%], FEV1 2.84 [112%], F/F88, TLC 5.47 [109%], DLCO 23.64 [118%] Normal test  FENO 02/02/18-15  Data from cardiology Echocardiogram 07/28/2020-LVEF 55-60%, normal LV function, mild MR, mild TR  Sleep study 2011-negative for sleep apnea  Treadmill stress test 10/07/2016- no evidence of ischemia.  Exercise tolerance is markedly reduced for age.  Consider evaluation for noncardiac etiology of dyspnea.  Labs CBC 01/02/18-WBC 4.6, eos 1.9%, absolute eosinophil count 87 Blood allergy  profile 01/02/18-IgE 131, mild allergies to dust mite, dog Assessment & Plan Pulmonary nodule and emphysema surveillance Pulmonary function test shows normal capacity, consistent with previous results. A 2.3 mm lung nodule appears benign. She missed a follow-up low-dose screening CT scan of the chest in May 2025. Significant smoking history, quit in 2013. She has some concerns but there are no current evidence of Sjogren's syndrome affecting the lungs. Mild positive ANA not considered significant. Experiences sharp pain when breathing deeply in certain positions, likely musculoskeletal. - Schedule a low-dose screening CT scan of the chest to monitor the lung nodule and emphysema. - Encourage avoidance of smoking and exposure to smoke or strong scents. - Advise maintaining a healthy lifestyle, including exercise, to preserve lung function. - Consider referral to rheumatology for further evaluation of possible Sjogren's syndrome if symptoms persist.  Anxiety Anxiety contributing to shortness of breath, especially when nervous.   Plan/Recommendations: - Follow-up screening CT chest  Return to clinic in 1 year  Lonna Coder MD [facility] 02/02/2018, 4:12 PM CC: Atha Muradyan,  Tri Chittick, MD

## 2023-12-16 NOTE — Patient Instructions (Signed)
 VISIT SUMMARY:  Today, we discussed your ongoing health concerns, including emphysema, a lung nodule, Sjogren's disease, fibromyalgia, depression, and musculoskeletal pain. We reviewed your recent test results and made plans for further monitoring and management of your conditions.  YOUR PLAN:  -PULMONARY NODULE AND EMPHYSEMA SURVEILLANCE: Emphysema is a lung condition that causes shortness of breath due to damaged air sacs. Your recent tests show slight progression of emphysema and a small lung nodule that appears benign. We need to schedule a low-dose screening CT scan of your chest to monitor these conditions. Continue avoiding smoking and exposure to strong scents, and maintain a healthy lifestyle with regular exercise to help preserve your lung function.  -SJOGREN'S DISEASE: Sjogren's disease is an autoimmune disorder that affects moisture-producing glands, leading to dry eyes, dry skin, and other symptoms. Your recent lab results showed mildly positive ANA, but no current evidence of lung involvement. If your symptoms persist, we may refer you to a rheumatologist for further evaluation.  INSTRUCTIONS:  Please schedule a low-dose screening CT scan of your chest to monitor your lung nodule and emphysema. Continue avoiding smoking and exposure to strong scents, and maintain a healthy lifestyle with regular exercise. If your symptoms related to Sjogren's disease persist, we may consider a referral to a rheumatologist for further evaluation.

## 2023-12-16 NOTE — Patient Instructions (Signed)
 Full pft without post spiro. Patient did not feel comfortable completing post due to heart condition.

## 2023-12-16 NOTE — Progress Notes (Signed)
 Full pft without post spiro. Patient did not feel comfortable completing post due to heart condition.

## 2023-12-17 LAB — UPEP/TP, 24-HR URINE

## 2023-12-17 NOTE — Telephone Encounter (Signed)
 Spoke with pt and I informed her that she can go to either a [facility] or our [place] office for the additional lab draw. She will call us  back when she is ready to schedule a follow up appt with Dr. Iva.

## 2023-12-17 NOTE — Addendum Note (Signed)
 Addended by: IVA MARTY SALTNESS on: 12/17/2023 12:20 PM   Modules accepted: Orders

## 2023-12-18 LAB — UPEP/TP, 24-HR URINE
Albumin, U: 100
Alpha 1, Urine: 0
Alpha 2, Urine: 0
Beta, Urine: 0
Gamma Globulin, Urine: 0
Protein, 24H Urine: 122 mg/(24.h) (ref 30–150)
Protein, Ur: 7.6 mg/dL

## 2023-12-19 LAB — CHRONIC URTICARIA PD-BAT: Pooled Donor- BAT CU: 2.85 % (ref 0.00–10.60)

## 2023-12-19 LAB — PROTEIN ELECTROPHORESIS, SERUM, WITH REFLEX
A/G Ratio: 1.5 (ref 0.7–1.7)
Albumin ELP: 3.9 g/dL (ref 2.9–4.4)
Alpha 1: 0.2 g/dL (ref 0.0–0.4)
Alpha 2: 0.6 g/dL (ref 0.4–1.0)
Beta: 0.9 g/dL (ref 0.7–1.3)
Gamma Globulin: 0.8 g/dL (ref 0.4–1.8)
Globulin, Total: 2.6 g/dL (ref 2.2–3.9)

## 2023-12-19 LAB — THYROID ANTIBODIES (THYROPEROXIDASE & THYROGLOBULIN)
Thyroglobulin Antibody: 3.6 [IU]/mL — ABNORMAL HIGH (ref 0.0–0.9)
Thyroperoxidase Ab SerPl-aCnc: 13 [IU]/mL (ref 0–34)

## 2023-12-19 LAB — CMP14+EGFR
ALT: 20 IU/L (ref 0–32)
AST: 20 IU/L (ref 0–40)
Albumin: 4.4 g/dL (ref 3.8–4.9)
Alkaline Phosphatase: 69 IU/L (ref 44–121)
BUN/Creatinine Ratio: 24 — ABNORMAL HIGH (ref 9–23)
BUN: 16 mg/dL (ref 6–24)
Bilirubin Total: 0.4 mg/dL (ref 0.0–1.2)
CO2: 25 mmol/L (ref 20–29)
Calcium: 9.2 mg/dL (ref 8.7–10.2)
Chloride: 101 mmol/L (ref 96–106)
Creatinine, Ser: 0.67 mg/dL (ref 0.57–1.00)
Globulin, Total: 2.1 g/dL (ref 1.5–4.5)
Glucose: 85 mg/dL (ref 70–99)
Potassium: 4.3 mmol/L (ref 3.5–5.2)
Sodium: 140 mmol/L (ref 134–144)
Total Protein: 6.5 g/dL (ref 6.0–8.5)
eGFR: 101 mL/min/1.73 (ref >59)

## 2023-12-19 LAB — C-REACTIVE PROTEIN: CRP: <1 mg/L (ref 0–10)

## 2023-12-19 LAB — FANA STAINING PATTERNS: Homogeneous Pattern: 1:80 {titer}

## 2023-12-19 LAB — RHEUMATOID FACTOR: Rheumatoid fact SerPl-aCnc: <10 [IU]/mL (ref <14.0)

## 2023-12-19 LAB — ANTINUCLEAR ANTIBODIES, IFA: ANA Titer 1: POSITIVE — AB

## 2023-12-19 LAB — TRYPTASE: Tryptase: 7.3 ug/L (ref 2.2–13.2)

## 2023-12-19 LAB — SEDIMENTATION RATE: Sed Rate: 3 mm/h (ref 0–40)

## 2023-12-23 NOTE — Telephone Encounter (Signed)
 Patient also cancels her 10/22/23 LDCT appt. Letter mailed to home in July 2025 to call for rescheduling

## 2023-12-24 ENCOUNTER — Telehealth (INDEPENDENT_AMBULATORY_CARE_PROVIDER_SITE_OTHER): Payer: Self-pay | Admitting: Physician Assistant

## 2023-12-24 NOTE — Telephone Encounter (Signed)
 12/24/23 Called patient back for provider, let the patient know that she can schedule an appointment for a new issue. Patient stated she doesn't want to schedule a new appointment she would like the provider to call and give recommendations. Informed provider.

## 2023-12-25 ENCOUNTER — Telehealth (INDEPENDENT_AMBULATORY_CARE_PROVIDER_SITE_OTHER): Payer: Self-pay

## 2023-12-25 NOTE — Telephone Encounter (Signed)
 Schedule appointment?

## 2023-12-29 ENCOUNTER — Other Ambulatory Visit: Payer: 59

## 2023-12-30 ENCOUNTER — Encounter: Payer: Self-pay | Admitting: Allergy & Immunology

## 2023-12-30 DIAGNOSIS — R768 Other specified abnormal immunological findings in serum: Secondary | ICD-10-CM

## 2023-12-30 LAB — TSH+T4F+T3FREE
Free T4: 1.18 ng/dL (ref 0.82–1.77)
T3, Free: 2.8 pg/mL (ref 2.0–4.4)
TSH: 2.77 u[IU]/mL (ref 0.450–4.500)

## 2023-12-31 ENCOUNTER — Ambulatory Visit (INDEPENDENT_AMBULATORY_CARE_PROVIDER_SITE_OTHER): Admitting: Otolaryngology

## 2023-12-31 ENCOUNTER — Encounter (INDEPENDENT_AMBULATORY_CARE_PROVIDER_SITE_OTHER): Payer: Self-pay | Admitting: Otolaryngology

## 2023-12-31 VITALS — BP 102/65 | HR 78 | Ht 63.0 in

## 2023-12-31 DIAGNOSIS — J312 Chronic pharyngitis: Secondary | ICD-10-CM

## 2023-12-31 DIAGNOSIS — R490 Dysphonia: Secondary | ICD-10-CM

## 2023-12-31 DIAGNOSIS — Z8719 Personal history of other diseases of the digestive system: Secondary | ICD-10-CM | POA: Diagnosis not present

## 2023-12-31 DIAGNOSIS — K219 Gastro-esophageal reflux disease without esophagitis: Secondary | ICD-10-CM

## 2023-12-31 DIAGNOSIS — R09A2 Foreign body sensation, throat: Secondary | ICD-10-CM

## 2023-12-31 DIAGNOSIS — K143 Hypertrophy of tongue papillae: Secondary | ICD-10-CM

## 2023-12-31 NOTE — Patient Instructions (Addendum)
 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 Please call [facility] Scheduling at [phone] to schedule your imaging if you have not received a call within 24 hours. If you are unable to complete your imaging study prior to your next scheduled visit please call our office to let us  know.  Take a multivitamin Go here for your labs:

## 2023-12-31 NOTE — Progress Notes (Unsigned)
 Dear Dr. Joshua, Here is my assessment for our mutual patient, Katherine Cruz. Thank you for allowing me the opportunity to care for your patient. Please do not hesitate to contact me should you have any other questions. Sincerely, Dr. Eldora Blanch  Otolaryngology Clinic Note Referring provider: Dr. Joshua HPI:  Katherine Cruz is a 58 y.o. female kindly referred by Dr. Joshua for evaluation of throat issues  Initial visit (11/2023): Patient reports: Over last 2-3 months, feels like she can feel a lump in her throat on the left. Sometimes hurts to talk and breathe in. She feels like her throat is very dry. Symptoms come and go --- worse when throat is dry, and with voice use. Better with rest (morning I do not feel it). She has tried gargling with saltwater, vitamin C. No voice change.  She was diagnosed with GERD - but does not notice it symptomatically. Not taking anything for reflux. She is currently on amoxicillin , and has had abx before for this and dental work - does not help.   She does report that she has had dental issues and just had a root canal which did not help so just underwent a procedure to save the bridge. No oral lesions she can note. Does eat mostly soft foods (recently - given dental work)  Patient otherwise denies: - dysphagia, odynophagia, aspiration episodes or PNA, need for Heimlich, unintentional weight loss - changes in voice, hemoptysis - otalgia, neck masses  --------------------------------------------------------- 12/31/2023 Seen in follow up. Has had autoimmune labs, referred to rheumatology. She is having pain and oral sensitivity and reports canker sores inside her mouth with concern for candida. She continues to have chronic discomfort in the throat. She has tried rinsing with baking soda and rinsing with salt water and this is helping her significantly.Mouth does feel dry constantly. No ulcers anywhere else.   She does bring pictures of this which does show  left buccal mucosal small ulcer and right retromolar trigone but these have resolved. She does report that she has been vegan for 10 years.   Patient otherwise denies: - dysphagia, odynophagia, aspiration episodes or PNA, need for Heimlich, unintentional weight loss - changes in voice, hemoptysis - otalgia, neck masses  ENT Surgery: no Personal or FHx of bleeding dz or anesthesia difficulty: no  AP/AC: no  Tobacco: former, 30 pack year  PMHx: MDD/GAD/PTSD, Palpitations, GERD, MVP  Independent Review of Additional Tests or Records:  Dr. Iva (08/28/2023): noted history of allergic rhinitis; persistent sore in mouth, eventually an abscess; noted dental issues as well; feeling of something stuck in throat, recent root canal; Dx: oral lesion? Rx: ref to ENT CBC/TSH/BMP: 05/20/2023: BUN/Cr wnl; WBC 4.4, Eos 100; TSH 3.69 Rheum labs: 12/09/2023: ANA positive; Stain pattern: homogenous Dr. Theophilus (12/16/2023): intermittent SOB, associated with anxiety? Concern for emphysema with concern for disease progression; query if sjogren's syndrome PMH/Meds/All/SocHx/FamHx/ROS:   Past Medical History:  Diagnosis Date   Allergic rhinitis    Allergy     Brain fog    Breast mass 08/23/2010   Depression    Eating disorder    Emphysema of lung (HCC)    Fibrocystic breast    Fibromyalgia    Fibromyalgia    GERD (gastroesophageal reflux disease)    Heart murmur    Hyperlipidemia    MVP (mitral valve prolapse)    Osteoarthritis    Ovarian cyst    PAC (premature atrial contraction)    Pelvic congestion syndrome    Positive PPD  PTSD (post-traumatic stress disorder)    PVC (premature ventricular contraction)    STD (sexually transmitted disease)    chl treated yrs ago   UTI (urinary tract infection)    UTI (urinary tract infection)      Past Surgical History:  Procedure Laterality Date   CERVICAL POLYPECTOMY     IR RADIOLOGIST EVAL & MGMT  06/19/2023   MAXILLARY SINUS LIFT      REPLANTATION THUMB     SINUS LIFT WITH BONE GRAFT     TUBAL LIGATION      Family History  Problem Relation Age of Onset   Arthritis Mother    Hearing loss Mother    Mental illness Mother    Tremor Mother    Alcohol abuse Father    Drug abuse Sister    Mental illness Sister    Parkinson's disease Maternal Grandmother    Mental illness Maternal Grandmother    Drug abuse Maternal Grandmother      Social Connections: Moderately Isolated (08/12/2023)   Social Connection and Isolation Panel    Frequency of Communication with Friends and Family: More than three times a week    Frequency of Social Gatherings with Friends and Family: Three times a week    Attends Religious Services: Never    Active Member of Clubs or Organizations: Yes    Attends Banker Meetings: 1 to 4 times per year    Marital Status: Divorced      Current Outpatient Medications:    amoxicillin  (AMOXIL ) 875 MG tablet, Take 875 mg by mouth 2 (two) times daily. (Patient not taking: Reported on 12/31/2023), Disp: , Rfl:    doxycycline  (VIBRAMYCIN ) 50 MG capsule, Take 50 mg by mouth daily. (Patient not taking: Reported on 12/31/2023), Disp: , Rfl:    Prasterone  (INTRAROSA ) 6.5 MG INST, Place 1 suppository vaginally at bedtime as needed. (Patient not taking: Reported on 12/31/2023), Disp: 30 each, Rfl: 12   Physical Exam:   BP 102/65 (BP Location: Left Arm, Patient Position: Sitting, Cuff Size: Normal)   Pulse 78   Ht 5' 3 (1.6 m)   LMP 03/24/2015 (Approximate)   SpO2 98%   BMI 21.26 kg/m   Salient findings:  CN II-XII intact Bilateral EAC clear and TM intact with well pneumatized middle ear spaces Anterior rhinoscopy: Septum relatively midline; bilateral inferior turbinates without significant hypertrophy No lesions of oral cavity/oropharynx except for some hairy changes/discoloration consistent with black hairy tongue; no palpable tongue base masses No obviously palpable neck  masses/lymphadenopathy/thyromegaly - the area where she reports she can feel the lump appears to be over hyoid cornua on left No respiratory distress or stridor; TFL was indicated to better evaluate the proximal airway, given the patient's history and exam findings, and is detailed below.  Seprately Identifiable Procedures:  Prior to initiating any procedures, risks/benefits/alternatives were explained to the patient and verbal consent obtained. Procedure Note Pre-procedure diagnosis: Globus sensation, LPR/GERD Post-procedure diagnosis: Same Procedure: Transnasal Fiberoptic Laryngoscopy, CPT 31575 - Mod 25 Indication: see above Complications: None apparent EBL: 0 mL  The procedure was undertaken to further evaluate the patient's complaint above, with mirror exam inadequate for appropriate examination due to gag reflex and poor patient tolerance  Procedure:  Patient was identified as correct patient. Verbal consent was obtained. The nose was sprayed with oxymetazoline and 4% lidocaine. The The flexible laryngoscope was passed through the nose to view the nasal cavity, pharynx (oropharynx, hypopharynx) and larynx.  The larynx was examined at  rest and during multiple phonatory tasks. Documentation was obtained and reviewed with patient. The scope was removed. The patient tolerated the procedure well.  Findings: The nasal cavity and nasopharynx did not reveal any masses or lesions, mucosa appeared to be without obvious lesions. The tongue base, pharyngeal walls, piriform sinuses, vallecula, epiglottis and postcricoid region are normal in appearance - unable to appreciate any foreign bodies or lesions. No evidence of granuloma. The visualized portion of the subglottis and proximal trachea is widely patent. The vocal folds are mobile bilaterally. There are no lesions on the free edge of the vocal folds nor elsewhere in the larynx worrisome for malignancy.  Mild compression suggestive of MTD with small  posterior glottic chink     Electronically signed by: Eldora KATHEE Blanch, MD 12/31/2023 11:23 AM   Impression & Plans:  Murline Weigel is a 58 y.o. female with:  1. Chronic sore throat   2. Black hairy tongue   3. H/O oral aphthous ulcers   4. Globus sensation   5. Laryngopharyngeal reflux (LPR)   6. Muscle tension dysphonia    We had an in depth discussion about her issues: her TFL is reassuring and appears to be most likely related to LPR or perhaps MTD. Worse when throat is dry. We discussed options: laryngeal massage, improved hydration, alginate therapy, voice Rx and further workup with CT. She would like to try massage and Reflux gourmet first which is reasonable Black hairy tongue: oral findings consistent with this - encouraged improved oral care and eating crunchy foods and brushing tongue. She understood  F/u in 3 months - sooner as necessary; if no improvement, can consider CT neck.   See below regarding exact medications prescribed this encounter including dosages and route: No orders of the defined types were placed in this encounter.     Thank you for allowing me the opportunity to care for your patient. Please do not hesitate to contact me should you have any other questions.  Sincerely, Eldora Blanch, MD Otolaryngologist (ENT), [facility] Phone: [phone] Fax: [phone]  12/31/2023, 11:23 AM   I have personally spent 47 minutes involved in face-to-face and non-face-to-face activities for this patient on the day of the visit.  Professional time spent excludes any procedures performed but includes the following activities, in addition to those noted in the documentation: preparing to see the patient (review of outside documentation and results), performing a medically appropriate examination, extensive counseling, documenting in the electronic health record

## 2024-01-06 ENCOUNTER — Encounter: Payer: Self-pay | Admitting: Internal Medicine

## 2024-01-08 ENCOUNTER — Ambulatory Visit: Admitting: Obstetrics
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
## 2024-01-12 ENCOUNTER — Encounter: Payer: Self-pay | Admitting: Cardiology

## 2024-01-12 NOTE — Telephone Encounter (Signed)
 Copied from CRM [id number]. Topic: General - Call Back - No Documentation >> Jan 12, 2024 11:33 AM [name] wrote: Reason for CRM: Patient called in and stated that she sent in a few MyChart messages to Dr. Joshua today and last week. Patients wants to go over labs. She said her allergist had ordered a few labs and she wants her primary care doctor to be aware of them. She also has a question to be answered in regards to Vitamins in MyChart.   [phone] (M) or Mychart response

## 2024-01-13 ENCOUNTER — Telehealth (INDEPENDENT_AMBULATORY_CARE_PROVIDER_SITE_OTHER): Payer: Self-pay

## 2024-01-13 ENCOUNTER — Other Ambulatory Visit (INDEPENDENT_AMBULATORY_CARE_PROVIDER_SITE_OTHER): Payer: Self-pay | Admitting: Otolaryngology

## 2024-01-13 ENCOUNTER — Telehealth (INDEPENDENT_AMBULATORY_CARE_PROVIDER_SITE_OTHER): Payer: Self-pay | Admitting: Otolaryngology

## 2024-01-13 DIAGNOSIS — Z8719 Personal history of other diseases of the digestive system: Secondary | ICD-10-CM

## 2024-01-13 DIAGNOSIS — J312 Chronic pharyngitis: Secondary | ICD-10-CM

## 2024-01-13 NOTE — Telephone Encounter (Signed)
 Patient left a message stating she was at [facility] and needed the order put in, she was trying to get this done today.  [phone]

## 2024-01-13 NOTE — Telephone Encounter (Signed)
 LVM for patient to call us back

## 2024-01-13 NOTE — Progress Notes (Signed)
 Reordered labs.

## 2024-01-15 ENCOUNTER — Ambulatory Visit: Payer: Self-pay

## 2024-01-15 ENCOUNTER — Ambulatory Visit

## 2024-01-15 NOTE — Telephone Encounter (Signed)
 FYI Only or Action Required?: Action required by provider: clinical question for provider, update on patient condition, and patient would like Dr Joshua to look over her labs; she is asking if she should have her pH tested and if that would be offered at the PCP.  Patient was last seen in primary care on 05/20/2023 by Joshua Debby CROME, MD.  Called Nurse Triage reporting Mouth Lesions.  Symptoms began about a month ago.  Interventions attempted: Other: home remedies swishing with warm salt water; baking soda and water; apple cider vinegar and water& soft diet.  Symptoms are: mouth ulcers/sores, gums inflamed and painful, red/swollen/painful tongue, dry mouth, sore throat, fever blisters intermittently gradually improving.  Triage Disposition: See Physician Within 24 Hours  Patient/caregiver understands and will follow disposition?: No, wishes to speak with PCP          Copied from CRM #[id number]. Topic: Clinical - Red Word Triage >> Jan 15, 2024  9:57 AM Harlene ORN wrote: Red Word that prompted transfer to Nurse Triage: has sores in her mouth. Painful and hard to eat and drink. Discoloration in her tongue Reason for Disposition  Gums are red, painful and have many sores (ulcers)  Answer Assessment - Initial Assessment Questions She states she was seen by her dentist, ENT, and allergist. She states she has had testing and  blood work done. She states they think she may have Sjogren's disease. The ENT told her burning mouth syndrome. She states she has an appointment to see a rheumatologist. Patient added that she was previously having issues with an ammonia taste in her mouth and her urine was smelling like bread/yeast.   1. LOCATION: Where is the mouth sore (ulcer) located?      All over, states they started on the inside of her right cheek and gums then moved to the left.  2. NUMBER: How many sores are there? :     5.  3. SIZE: How large is the sore?  (e.g., size of an  apple seed, watermelon seed, pencil eraser)     She states the right side is about 2 inches and states it is almost the entire cheek, also her right gums are swollen.   4. PAIN: Are they painful? If Yes, ask: How bad is it?  (Scale 0-10; or none, mild, moderate, severe)     Yes, /10. She states she has to chew very carefully and can not eat hard foods. Painful to brush her teeth or eat.  5. ONSET: When did you first notice the sore?      X1 month, she states they come and go.  6. RECURRENT SYMPTOM: Have you had a mouth ulcer before? If Yes, ask: When was the last time? and What happened that time?      No.  7. CAUSE: What do you think is causing the mouth sore?     She states she would like to have her pH tested but she also states she thinks it is a Candida or fungal infection. She also states she thinks it could be related to her GERD.  8. OTHER SYMPTOMS: Do you have any other symptoms? (e.g., fever, swollen lymph node)     Dry mouth; Tongue turned black then turned white, she states currently her tongue is red/painful/swollen, larger than what it is but denies not being able to fit tongue in mouth or obstructing breathing; fever blisters intermittently; chronic sore throat. Denies difficulty breathing, drooling, difficulty swallowing liquids,fever, rash.  9. PREGNANCY:  Is there any chance you are pregnant? When was your last menstrual period?     N/A.  Protocols used: Mouth Ulcers-A-AH

## 2024-01-15 NOTE — Telephone Encounter (Signed)
 Copied from CRM #[id number]. Topic: Clinical - Medical Advice >> Jan 15, 2024  9:56 AM Harlene ORN wrote: Reason for CRM: Patient said to tell dr. Joshua to respond back to her in her MyChart. Also, give her information on the [facility] test.

## 2024-01-15 NOTE — Telephone Encounter (Signed)
**Note De-identified  [place] Obfuscation** Please advise 

## 2024-01-16 ENCOUNTER — Ambulatory Visit (INDEPENDENT_AMBULATORY_CARE_PROVIDER_SITE_OTHER): Payer: Self-pay | Admitting: Otolaryngology

## 2024-01-16 ENCOUNTER — Ambulatory Visit: Payer: 59 | Admitting: Cardiology

## 2024-01-16 LAB — B12 AND FOLATE PANEL
Folate: 5.8 ng/mL (ref >3.0)
Vitamin B-12: 750 pg/mL (ref 232–1245)

## 2024-01-16 NOTE — Telephone Encounter (Signed)
B12 and folate normal.

## 2024-01-16 NOTE — Progress Notes (Signed)
 Hey Nef, would you mind calling this lady and letting her know that her B12 and folate were normal. Nothing of concern.

## 2024-01-20 ENCOUNTER — Ambulatory Visit: Admitting: Obstetrics and Gynecology

## 2024-01-21 NOTE — Telephone Encounter (Signed)
 I called and left a message stating that she needs an appointment since reviewing her message/concerns. To call [phone]

## 2024-01-22 ENCOUNTER — Encounter: Admitting: Rheumatology

## 2024-01-22 ENCOUNTER — Encounter: Payer: Self-pay | Admitting: Pulmonary Disease

## 2024-01-22 DIAGNOSIS — K146 Glossodynia: Secondary | ICD-10-CM

## 2024-01-22 DIAGNOSIS — I341 Nonrheumatic mitral (valve) prolapse: Secondary | ICD-10-CM

## 2024-01-22 DIAGNOSIS — K8020 Calculus of gallbladder without cholecystitis without obstruction: Secondary | ICD-10-CM

## 2024-01-22 DIAGNOSIS — E782 Mixed hyperlipidemia: Secondary | ICD-10-CM

## 2024-01-22 DIAGNOSIS — I700 Atherosclerosis of aorta: Secondary | ICD-10-CM

## 2024-01-22 DIAGNOSIS — E559 Vitamin D deficiency, unspecified: Secondary | ICD-10-CM

## 2024-01-22 DIAGNOSIS — Z87891 Personal history of nicotine dependence: Secondary | ICD-10-CM

## 2024-01-22 DIAGNOSIS — R768 Other specified abnormal immunological findings in serum: Secondary | ICD-10-CM

## 2024-01-22 DIAGNOSIS — F411 Generalized anxiety disorder: Secondary | ICD-10-CM

## 2024-01-22 DIAGNOSIS — R251 Tremor, unspecified: Secondary | ICD-10-CM

## 2024-01-22 DIAGNOSIS — R5382 Chronic fatigue, unspecified: Secondary | ICD-10-CM

## 2024-01-22 DIAGNOSIS — N9489 Other specified conditions associated with female genital organs and menstrual cycle: Secondary | ICD-10-CM

## 2024-01-22 DIAGNOSIS — M159 Polyosteoarthritis, unspecified: Secondary | ICD-10-CM

## 2024-01-22 DIAGNOSIS — E2839 Other primary ovarian failure: Secondary | ICD-10-CM

## 2024-01-22 NOTE — Telephone Encounter (Signed)
 Unable to reach patient. LMTRC

## 2024-01-23 NOTE — Telephone Encounter (Signed)
**Note De-identified  [place] Obfuscation** Please advise 

## 2024-01-26 NOTE — Telephone Encounter (Signed)
 Patient has been scheduled to see Corean Ku, NP

## 2024-01-29 ENCOUNTER — Ambulatory Visit: Admitting: Family Medicine

## 2024-02-03 ENCOUNTER — Encounter: Payer: Self-pay | Admitting: Allergy & Immunology

## 2024-02-03 NOTE — Telephone Encounter (Signed)
**Note De-identified  [place] Obfuscation** Please advise 

## 2024-02-04 ENCOUNTER — Telehealth: Payer: Self-pay | Admitting: Radiology

## 2024-02-04 NOTE — Telephone Encounter (Signed)
 Copied from CRM [id number]. Topic: General - Other >> Feb 04, 2024  8:59 AM Thersia BROCKS wrote: Reason for CRM: Patient called in regarding trying to message Dr.Jones, would like for him or his nurses to give her a callback as she stated no one has been responding to her, stated she may need to change doctors if no one respond in a timely manner

## 2024-02-04 NOTE — Telephone Encounter (Signed)
**Note De-identified  [place] Obfuscation** Please advise 

## 2024-02-05 NOTE — Telephone Encounter (Signed)
 A message from this patient was forwarded to you back on 8/21 and 8/29 if you could please review this message and respond back to the patient via My chart she would greatly appreciate it.

## 2024-02-10 ENCOUNTER — Telehealth (INDEPENDENT_AMBULATORY_CARE_PROVIDER_SITE_OTHER): Admitting: Family Medicine

## 2024-02-10 DIAGNOSIS — Z9109 Other allergy status, other than to drugs and biological substances: Secondary | ICD-10-CM

## 2024-02-10 DIAGNOSIS — T7800XD Anaphylactic reaction due to unspecified food, subsequent encounter: Secondary | ICD-10-CM | POA: Diagnosis not present

## 2024-02-10 DIAGNOSIS — T7800XA Anaphylactic reaction due to unspecified food, initial encounter: Secondary | ICD-10-CM

## 2024-02-10 DIAGNOSIS — T783XXD Angioneurotic edema, subsequent encounter: Secondary | ICD-10-CM

## 2024-02-10 DIAGNOSIS — K1370 Unspecified lesions of oral mucosa: Secondary | ICD-10-CM

## 2024-02-10 DIAGNOSIS — R140 Abdominal distension (gaseous): Secondary | ICD-10-CM | POA: Diagnosis not present

## 2024-02-10 DIAGNOSIS — J3089 Other allergic rhinitis: Secondary | ICD-10-CM

## 2024-02-10 DIAGNOSIS — K219 Gastro-esophageal reflux disease without esophagitis: Secondary | ICD-10-CM

## 2024-02-10 NOTE — Progress Notes (Unsigned)
 RE: Katherine Cruz MRN: [id number] DOB: 1966/02/05 Date of MyChart Video Visit: 02/10/2024  Referring provider: Joshua Debby CROME, MD Primary care provider: Joshua Debby CROME, MD  Chief Complaint: No chief complaint on file.   MyChart video Follow Up Visit via MyChart video: I connected with Katherine Cruz for a follow up on 02/10/24 by MyChart video and verified that I am speaking with the correct person using two identifiers.   I discussed the limitations, risks, security and privacy concerns of performing an evaluation and management service by MyChart video and the availability of in person appointments. I also discussed with the patient that there may be a patient responsible charge related to this service. The patient expressed understanding and agreed to proceed.  Patient is at home.  Provider is at the home office.  Visit start time: 1101 Visit end time: 1146 Insurance consent/check in by: self check in Mychart Medical consent and medical assistant/nurse: Keilana Morlock  History of Present Illness: She is a 58 y.o. female, who is being followed for bloating, food allergy , allergic rhinitis, reflux, oral lesion, and nickel allergy .. Her previous allergy  office visit was on 08/28/2023 with Dr. Iva.   At today's visit, she reports that she continues to experience bloating after eating any food.  She reports that she has recently been eating smaller meals and this has helped to decrease some of the bloating.  She is interested in exploring possible food allergies further and is requesting food allergy  testing for some foods that are frequently in her diet including eggplant, pepper, honey, maple syrup, vinegar, nutritional yeast, chickpea, turmeric, mint, basil, beets, pears, broccoli, cauliflower, quinoa, Brussels sprouts, and lentil.  She denies experiencing integumentary or cardiopulmonary symptoms with ingestion of these foods, however, she continues to experience GI symptoms.  At this time, she  continues to avoid tomato, ginger, mushroom, and egg.  She continues to consume a vegan diet.  Her last food allergy  skin testing on 06/17/2023 was positive to egg, tomato, mushroom, and ginger.  Lab testing to food was equivocal to bakers yeast at 0.12 and negative to black olive, spinach, coffee, and cumin seed.  She continues to experience oral lesions in her mouth which occur intermittently and are extremely painful.  She reports these sores occur mainly after consuming honey.  She also reports a white coating that appears intermittently on her tongue and resolves within the same day.  She has previously been treated for Candida with a Mycostatin  rinse after which she reports her tongue turned black.  She stopped using the Mycostatin  rinse and her tongue resumed normal coloring.  She continues to use a toothpaste that does not contain sodium lauryl sulfate, however, she reports that the active brushing on her gums is very painful.  She continues to follow-up with Dr. Tobie, ENT specialist at [facility] for evaluation of chronic sore throat, aphthous ulcers, globus sensation, laryngeal pharyngeal reflux, and black hairy tongue with resolution and no evidence of Candida.  Neck CT was ordered for persistent sore throat.  She has also been referred to rheumatology with a slightly positive ANA.  She reports frequent pain and fatigue and has previously been diagnosed with fibromyalgia.  Allergic rhinitis is reported as moderately well-controlled with symptoms including occasional rhinorrhea, and occasional nasal congestion.  Her last environmental allergy  testing by lab on 06/17/2023 was positive to dust mite, dog, and mold.   Reflux is reported as poorly controlled with symptoms occurring frequently.  She is not currently taking  a medication to control reflux.  She continues to follow-up with [facility] gastro[facility]ology as recommended.  Her current medications are listed in the  chart.  Assessment and Plan: Katherine Cruz is a 58 y.o. female with: There are no Patient Instructions on file for this visit.  No follow-ups on file.  No orders of the defined types were placed in this encounter.  Lab Orders  No laboratory test(s) ordered today    Diagnostics: None.  Medication List:  Current Outpatient Medications  Medication Sig Dispense Refill  . amoxicillin  (AMOXIL ) 875 MG tablet Take 875 mg by mouth 2 (two) times daily. (Patient not taking: Reported on 12/31/2023)    . doxycycline  (VIBRAMYCIN ) 50 MG capsule Take 50 mg by mouth daily. (Patient not taking: Reported on 12/31/2023)    . Prasterone  (INTRAROSA ) 6.5 MG INST Place 1 suppository vaginally at bedtime as needed. (Patient not taking: Reported on 12/31/2023) 30 each 12   No current facility-administered medications for this visit.   Allergies: Allergies  Allergen Reactions  . Latex Itching  . Nickel Rash   I reviewed her past medical history, social history, family history, and environmental history and no significant changes have been reported from previous visit on ***.  Review of Systems Objective: Physical Exam Not obtained as encounter was done via MyChart video.  Previous notes and tests were reviewed.  I discussed the assessment and treatment plan with the patient. The patient was provided an opportunity to ask questions and all were answered. The patient agreed with the plan and demonstrated an understanding of the instructions.   The patient was advised to call back or seek an in-person evaluation if the symptoms worsen or if the condition fails to improve as anticipated.  I provided *** minutes of non-face-to-face time during this encounter.  It was my pleasure to participate in [place] Veith's care today. Please feel free to contact me with any questions or concerns.   Sincerely,  Arlean Mutter, FNP

## 2024-02-10 NOTE — Patient Instructions (Addendum)
 Allergic rhinitis Continue allergen avoidance measures directed toward mold, dust mites, and dog as listed below Consider saline nasal rinses as needed for nasal symptoms. Use this before any medicated nasal sprays for best result  Food allergy /intolerance Continue to avoid tomato, mushroom, ginger, and egg. Consider avoiding yeast in all forms to see if this improves your symptoms. If no change or improvement in your symptoms after about 1 week, you may add this item back into your diet. In case of an allergic reaction, take cetirizine 10 mg once a day and for a life threatening reaction call 911 Further lab testing entered for foods including eggplant, chickpea, tuneric, mint, basil, beet, broccoli, cauliflower, quinoa, brussels sprout, and lentil    Allergy : food allergy  is when you have eaten a food, developed an allergic reaction after eating the food and have IgE to the food (positive food testing either by skin testing or blood testing).  Food allergy  could lead to life threatening symptoms  Sensitivity: occurs when you have IgE to a food (positive food testing either by skin testing or blood testing) but is a food you eat without any issues.  This is not an allergy  and we recommend keeping the food in the diet  Intolerance: this is when you have negative testing by either skin testing or blood testing thus not allergic but the food causes symptoms (like belly pain, bloating, diarrhea etc) with ingestion.  These foods should be avoided to prevent symptoms.    Possible contact dermatitis Continue to avoid nickel Consider patch testing  Oral lesion Follow up with ENT specialist as recommended  Reflux Continue dietary lifestyle modifications as listed below.  Great job on smaller meal size and less bloating Continue to follow-up with your GI specialist as recommended  Call the clinic if this treatment plan is not working well for you  Follow up in the clinic in 1-2 months or sooner if  needed.  Control of Mold Allergen Mold and fungi can grow on a variety of surfaces provided certain temperature and moisture conditions exist.  Outdoor molds grow on plants, decaying vegetation and soil.  The major outdoor mold, Alternaria and Cladosporium, are found in very high numbers during hot and dry conditions.  Generally, a late Summer - Fall peak is seen for common outdoor fungal spores.  Rain will temporarily lower outdoor mold spore count, but counts rise rapidly when the rainy period ends.  The most important indoor molds are Aspergillus and Penicillium.  Dark, humid and poorly ventilated basements are ideal sites for mold growth.  The next most common sites of mold growth are the bathroom and the kitchen.  Outdoor [employer] Use air conditioning and keep windows closed Avoid exposure to decaying vegetation. Avoid leaf raking. Avoid grain handling. Consider wearing a face mask if working in moldy areas.  Indoor Mold Control Maintain humidity below 50%. Clean washable surfaces with 5% bleach solution. Remove sources e.g. Contaminated carpets.   Control of Dust Mite Allergen Dust mites play a major role in allergic asthma and rhinitis. They occur in environments with high humidity wherever human skin is found. Dust mites absorb humidity from the atmosphere (ie, they do not drink) and feed on organic matter (including shed human and animal skin). Dust mites are a microscopic type of insect that you cannot see with the naked eye. High levels of dust mites have been detected from mattresses, pillows, carpets, upholstered furniture, bed covers, clothes, soft toys and any woven material. The principal allergen  of the dust mite is found in its feces. A gram of dust may contain 1,000 mites and 250,000 fecal particles. Mite antigen is easily measured in the air during house cleaning activities. Dust mites do not bite and do not cause harm to humans, other than by triggering  allergies/asthma.  Ways to decrease your exposure to dust mites in your home:  1. Encase mattresses, box springs and pillows with a mite-impermeable barrier or cover  2. Wash sheets, blankets and drapes weekly in hot water (130 F) with detergent and dry them in a dryer on the hot setting.  3. Have the room cleaned frequently with a vacuum cleaner and a damp dust-mop. For carpeting or rugs, vacuuming with a vacuum cleaner equipped with a high-efficiency particulate air (HEPA) filter. The dust mite allergic individual should not be in a room which is being cleaned and should wait 1 hour after cleaning before going into the room.  4. Do not sleep on upholstered furniture (eg, couches).  5. If possible removing carpeting, upholstered furniture and drapery from the home is ideal. Horizontal blinds should be eliminated in the rooms where the person spends the most time (bedroom, study, television room). Washable vinyl, roller-type shades are optimal.  6. Remove all non-washable stuffed toys from the bedroom. Wash stuffed toys weekly like sheets and blankets above.  7. Reduce indoor humidity to less than 50%. Inexpensive humidity monitors can be purchased at most hardware stores. Do not use a humidifier as can make the problem worse and are not recommended.  Control of Dog or Cat Allergen Avoidance is the best way to manage a dog or cat allergy . If you have a dog or cat and are allergic to dog or cats, consider removing the dog or cat from the home. If you have a dog or cat but don't want to find it a new home, or if your family wants a pet even though someone in the household is allergic, here are some strategies that may help keep symptoms at bay:  Keep the pet out of your bedroom and restrict it to only a few rooms. Be advised that keeping the dog or cat in only one room will not limit the allergens to that room. Don't pet, hug or kiss the dog or cat; if you do, wash your hands with soap and  water. High-efficiency particulate air (HEPA) cleaners run continuously in a bedroom or living room can reduce allergen levels over time. Regular use of a high-efficiency vacuum cleaner or a central vacuum can reduce allergen levels. Giving your dog or cat a bath at least once a week can reduce airborne allergen.

## 2024-02-11 ENCOUNTER — Encounter: Payer: Self-pay | Admitting: Family Medicine

## 2024-02-11 DIAGNOSIS — K1370 Unspecified lesions of oral mucosa: Secondary | ICD-10-CM | POA: Insufficient documentation

## 2024-02-11 DIAGNOSIS — T783XXA Angioneurotic edema, initial encounter: Secondary | ICD-10-CM | POA: Insufficient documentation

## 2024-02-11 DIAGNOSIS — K219 Gastro-esophageal reflux disease without esophagitis: Secondary | ICD-10-CM | POA: Insufficient documentation

## 2024-02-11 DIAGNOSIS — Z9109 Other allergy status, other than to drugs and biological substances: Secondary | ICD-10-CM | POA: Insufficient documentation

## 2024-02-11 DIAGNOSIS — T7800XA Anaphylactic reaction due to unspecified food, initial encounter: Secondary | ICD-10-CM | POA: Insufficient documentation

## 2024-02-11 DIAGNOSIS — R140 Abdominal distension (gaseous): Secondary | ICD-10-CM | POA: Insufficient documentation

## 2024-02-11 NOTE — Progress Notes (Signed)
   02/10/24 9096  Visit Method  Type of Visit Not In-person

## 2024-02-19 ENCOUNTER — Ambulatory Visit: Admitting: Rheumatology

## 2024-02-20 NOTE — Telephone Encounter (Signed)
 You can discontinue the probiotics for about a week and note if your symptoms improve during that time. If no improvement, you may add the probiotics back into your schedule.

## 2024-02-25 NOTE — Telephone Encounter (Unsigned)
 Copied from CRM #[id number]. Topic: General - Other >> Feb 25, 2024  9:24 AM Robinson H wrote: Reason for CRM: Patient states she has been waiting on office to send her a list of family providers and she hasn't received them.  Freddy [phone]

## 2024-02-25 NOTE — Telephone Encounter (Signed)
 Advised patient that we have sent her the list twice via mail. She gave a verbal understanding.

## 2024-02-26 ENCOUNTER — Ambulatory Visit: Admitting: Internal Medicine

## 2024-03-10 ENCOUNTER — Ambulatory Visit: Admitting: Physical Therapy
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
## 2024-04-01 ENCOUNTER — Ambulatory Visit (INDEPENDENT_AMBULATORY_CARE_PROVIDER_SITE_OTHER): Admitting: Otolaryngology

## 2024-04-13 ENCOUNTER — Encounter: Payer: Self-pay | Admitting: Rheumatology

## 2024-04-13 ENCOUNTER — Ambulatory Visit: Attending: Rheumatology | Admitting: Rheumatology

## 2024-04-13 VITALS — BP 105/67 | HR 75 | Temp 97.4°F | Resp 14 | Ht 63.0 in | Wt 118.6 lb

## 2024-04-13 DIAGNOSIS — I341 Nonrheumatic mitral (valve) prolapse: Secondary | ICD-10-CM

## 2024-04-13 DIAGNOSIS — M3500 Sicca syndrome, unspecified: Secondary | ICD-10-CM | POA: Diagnosis not present

## 2024-04-13 DIAGNOSIS — R7689 Other specified abnormal immunological findings in serum: Secondary | ICD-10-CM

## 2024-04-13 DIAGNOSIS — E559 Vitamin D deficiency, unspecified: Secondary | ICD-10-CM

## 2024-04-13 DIAGNOSIS — M797 Fibromyalgia: Secondary | ICD-10-CM

## 2024-04-13 DIAGNOSIS — M2550 Pain in unspecified joint: Secondary | ICD-10-CM | POA: Diagnosis not present

## 2024-04-13 DIAGNOSIS — F411 Generalized anxiety disorder: Secondary | ICD-10-CM

## 2024-04-13 DIAGNOSIS — B351 Tinea unguium: Secondary | ICD-10-CM

## 2024-04-13 DIAGNOSIS — R5382 Chronic fatigue, unspecified: Secondary | ICD-10-CM

## 2024-04-13 DIAGNOSIS — E782 Mixed hyperlipidemia: Secondary | ICD-10-CM

## 2024-04-13 DIAGNOSIS — K219 Gastro-esophageal reflux disease without esophagitis: Secondary | ICD-10-CM

## 2024-04-13 DIAGNOSIS — I700 Atherosclerosis of aorta: Secondary | ICD-10-CM

## 2024-04-13 DIAGNOSIS — R251 Tremor, unspecified: Secondary | ICD-10-CM

## 2024-04-13 DIAGNOSIS — Z87891 Personal history of nicotine dependence: Secondary | ICD-10-CM

## 2024-04-13 DIAGNOSIS — J438 Other emphysema: Secondary | ICD-10-CM

## 2024-04-13 DIAGNOSIS — N9489 Other specified conditions associated with female genital organs and menstrual cycle: Secondary | ICD-10-CM

## 2024-04-13 DIAGNOSIS — J3089 Other allergic rhinitis: Secondary | ICD-10-CM

## 2024-04-13 DIAGNOSIS — M4004 Postural kyphosis, thoracic region: Secondary | ICD-10-CM

## 2024-04-13 DIAGNOSIS — K8020 Calculus of gallbladder without cholecystitis without obstruction: Secondary | ICD-10-CM

## 2024-04-13 DIAGNOSIS — T7800XA Anaphylactic reaction due to unspecified food, initial encounter: Secondary | ICD-10-CM

## 2024-04-13 NOTE — Progress Notes (Unsigned)
    Subjective:    Patient ID: Katherine Cruz, female    DOB: 02-Feb-1966, 58 y.o.   MRN: [id number]      HPI Katherine Cruz is here for No chief complaint on file.        Medications and allergies reviewed with patient and updated if appropriate.  No current outpatient medications on file prior to visit.   No current facility-administered medications on file prior to visit.    Review of Systems     Objective:  There were no vitals filed for this visit. BP Readings from Last 3 Encounters:  04/13/24 105/67  12/31/23 102/65  12/16/23 116/70   Wt Readings from Last 3 Encounters:  04/13/24 118 lb 9.6 oz (53.8 kg)  12/16/23 120 lb (54.4 kg)  09/18/23 119 lb (54 kg)   There is no height or weight on file to calculate BMI.    Physical Exam         Assessment & Plan:    See Problem List for Assessment and Plan of chronic medical problems.

## 2024-04-14 ENCOUNTER — Ambulatory Visit (INDEPENDENT_AMBULATORY_CARE_PROVIDER_SITE_OTHER): Admitting: Internal Medicine

## 2024-04-14 ENCOUNTER — Encounter: Payer: Self-pay | Admitting: Internal Medicine

## 2024-04-14 VITALS — BP 98/72 | HR 72 | Temp 98.6°F | Ht 63.0 in

## 2024-04-14 DIAGNOSIS — M79604 Pain in right leg: Secondary | ICD-10-CM

## 2024-04-14 DIAGNOSIS — B370 Candidal stomatitis: Secondary | ICD-10-CM | POA: Diagnosis not present

## 2024-04-14 MED ORDER — NYSTATIN 100000 UNIT/ML MT SUSP: 5.0000 mL | Freq: Four times a day (QID) | OROMUCOSAL | 0 refills | Status: DC

## 2024-04-14 NOTE — Patient Instructions (Addendum)
     Your pain is likely nerve pain.  Monitor it for now.     Calcium citrate 600 mg daily with high calcium diet Vitamin D  2000 units daily Consider B complex    Medications changes include :   nystatin  swish and spit    Return if symptoms worsen or fail to improve.

## 2024-04-18 ENCOUNTER — Ambulatory Visit: Payer: Self-pay | Admitting: Rheumatology

## 2024-04-18 NOTE — Progress Notes (Signed)
 All the lab results are within normal limits.  Please forward results to her PCP.

## 2024-04-21 LAB — PROTEIN / CREATININE RATIO, URINE
Creatinine, Urine: 94 mg/dL (ref 20–275)
Protein/Creat Ratio: 106 mg/g{creat} (ref 24–184)
Protein/Creatinine Ratio: 0.106 mg/mg{creat} (ref 0.024–0.184)
Total Protein, Urine: 10 mg/dL (ref 5–24)

## 2024-04-21 LAB — PROTEIN ELECTROPHORESIS, SERUM, WITH REFLEX
Albumin ELP: 4.2 g/dL (ref 3.8–4.8)
Alpha 1: 0.3 g/dL (ref 0.2–0.3)
Alpha 2: 0.6 g/dL (ref 0.5–0.9)
Beta 2: 0.3 g/dL (ref 0.2–0.5)
Beta Globulin: 0.4 g/dL (ref 0.4–0.6)
Gamma Globulin: 0.8 g/dL (ref 0.8–1.7)
Total Protein: 6.5 g/dL (ref 6.1–8.1)

## 2024-04-21 LAB — IGG, IGA, IGM
IgG (Immunoglobin G), Serum: 826 mg/dL (ref 600–1640)
IgM, Serum: 171 mg/dL (ref 50–300)
Immunoglobulin A: 126 mg/dL (ref 47–310)

## 2024-04-21 LAB — BETA-2 GLYCOPROTEIN ANTIBODIES
Beta-2 Glyco 1 IgA: <2 U/mL (ref <20.0)
Beta-2 Glyco 1 IgM: 7.4 U/mL (ref <20.0)
Beta-2 Glyco I IgG: <2 U/mL (ref <20.0)

## 2024-04-21 LAB — C3 AND C4
C3 Complement: 115 mg/dL (ref 83–193)
C4 Complement: 27 mg/dL (ref 15–57)

## 2024-04-21 LAB — ANTI-SMITH ANTIBODY: ENA SM Ab Ser-aCnc: <1 AI

## 2024-04-21 LAB — CARDIOLIPIN ANTIBODIES, IGG, IGM, IGA
Anticardiolipin IgA: <2 [APL'U]/mL (ref <20.0)
Anticardiolipin IgG: <2 [GPL'U]/mL (ref <20.0)
Anticardiolipin IgM: 9.5 [MPL'U]/mL (ref <20.0)

## 2024-04-21 LAB — RNP ANTIBODY: Ribonucleic Protein(ENA) Antibody, IgG: <1 AI

## 2024-04-21 LAB — ANTI-SCLERODERMA ANTIBODY: Scleroderma (Scl-70) (ENA) Antibody, IgG: <1 AI

## 2024-04-21 LAB — SJOGRENS SYNDROME-A EXTRACTABLE NUCLEAR ANTIBODY: SSA (Ro) (ENA) Antibody, IgG: <1 AI

## 2024-04-21 LAB — ANTI-DNA ANTIBODY, DOUBLE-STRANDED: ds DNA Ab: 1 [IU]/mL

## 2024-04-21 LAB — SJOGRENS SYNDROME-B EXTRACTABLE NUCLEAR ANTIBODY: SSB (La) (ENA) Antibody, IgG: <1 AI

## 2024-04-21 LAB — ANA: Anti Nuclear Antibody (ANA): NEGATIVE

## 2024-04-22 ENCOUNTER — Encounter: Payer: Self-pay | Admitting: Rheumatology

## 2024-04-22 NOTE — Telephone Encounter (Signed)
 Attempted to contact patient and left message on machine to advise the patient to call the office.

## 2024-04-22 NOTE — Telephone Encounter (Signed)
 All the lab results are within normal limits.  No further labs are needed.  Will discuss results at the follow-up visit.  She may discuss insomnia with her PCP.

## 2024-04-23 NOTE — Telephone Encounter (Signed)
 LMOM for patient to return call regarding which x-rays she would like to have performed. Send patient to hospital or [facility]. Patient should reach out to PCP for referral per Dr. Dolphus.

## 2024-04-29 ENCOUNTER — Telehealth: Payer: Self-pay | Admitting: Rheumatology

## 2024-04-29 ENCOUNTER — Telehealth: Payer: Self-pay

## 2024-04-29 NOTE — Telephone Encounter (Signed)
 Patient is requesting a call back to get her x-rays ordered.

## 2024-04-29 NOTE — Telephone Encounter (Signed)
 Copied from CRM #[id number]. Topic: Clinical - Medical Advice >> Apr 29, 2024 10:02 AM Katherine Cruz wrote: Reason for CRM: Patient has question about Nystatin . The instructions say swallow or spit out as directed. Patient unsure which she is to do. Patient CB# [phone]

## 2024-04-29 NOTE — Telephone Encounter (Signed)
 I called patient, patient is having pain from her waist down to her ankles, patient will return call after consulting with PCP and Vein specialist to clarify which x-rays she wants ordered.

## 2024-05-05 ENCOUNTER — Ambulatory Visit: Admitting: Internal Medicine

## 2024-05-10 ENCOUNTER — Other Ambulatory Visit: Payer: Self-pay | Admitting: Internal Medicine

## 2024-05-10 NOTE — Telephone Encounter (Unsigned)
 Copied from CRM #[id number]. Topic: Clinical - Medication Refill >> May 10, 2024  9:37 AM Emylou G wrote: Medication: nystatin  (MYCOSTATIN ) 100000 UNIT/ML suspension  Has the patient contacted their pharmacy? Yes (Agent: If no, request that the patient contact the pharmacy for the refill. If patient does not wish to contact the pharmacy document the reason why and proceed with request.) (Agent: If yes, when and what did the pharmacy advise?) said to call us   This is the patient's preferred pharmacy:  [id number] [address], [place] - [address] [place] [address] Phone: [phone] Fax: [phone]   Is this the correct pharmacy for this prescription? Yes If no, delete pharmacy and type the correct one.   Has the prescription been filled recently? Yes  Is the patient out of the medication? Yes  Has the patient been seen for an appointment in the last year OR does the patient have an upcoming appointment? Yes  Can we respond through MyChart? Yes  Agent: Please be advised that Rx refills may take up to 3 business days. We ask that you follow-up with your pharmacy.

## 2024-05-11 MED ORDER — NYSTATIN 100000 UNIT/ML MT SUSP: 5.0000 mL | Freq: Four times a day (QID) | OROMUCOSAL | 0 refills | Status: DC

## 2024-05-14 ENCOUNTER — Ambulatory Visit: Payer: Self-pay

## 2024-05-14 NOTE — Telephone Encounter (Signed)
 FYI Only or Action Required?: Action required by provider: clinical question for provider and update on patient condition.  Patient was last seen in primary care on 04/14/2024 by Geofm Glade PARAS, MD.  Called Nurse Triage reporting Mouth Lesions.  Symptoms began several months ago.  Interventions attempted: Rest, hydration, or home remedies.  Symptoms are: unchanged.  Triage Disposition: See PCP When Office is Open (Within 3 Days)  Patient/caregiver understands and will follow disposition?: Yes  Message from [facility] S sent at 05/14/2024 10:19 AM EST  Reason for Triage: prescribed nystatin  (MYCOSTATIN ) 100000 UNIT/ML suspension med did not work for her mouth. She can barely eat and said she talks funny. [phone]   Reason for Disposition  [1] MILD-MODERATE mouth pain AND [2] present > 3 days  Answer Assessment - Initial Assessment Questions Patient has been dealing with mouth concerns-patient reports sores and white patches in her mouth. Patient is asking for recommendations-patient is wanting to know if there is something she would be able to take to numb her mouth so she can eat. Reports a lot of pain when trying to eat her meals.   1. ONSET: When did your mouth start hurting? (e.g., hours or days ago)      Several months 2. SEVERITY: How bad is the pain? (Scale 1-10; or mild, moderate or severe)     Moderate to severe pain when eating 3. SORES: Are there any sores or ulcers in the mouth? If Yes, ask: What part of the mouth are the sores in?     yes 4. FEVER: Do you have a fever? If Yes, ask: What is your temperature, how was it measured, and when did it start?     no 5. CAUSE: What do you think is causing the mouth pain?     unsure 6. OTHER SYMPTOMS: Do you have any other symptoms? (e.g., difficulty breathing)     no  Protocols used: Mouth Pain-A-AH

## 2024-05-14 NOTE — Telephone Encounter (Signed)
 Patient has been scheduled to see Laymon Rung.

## 2024-05-17 ENCOUNTER — Telehealth: Payer: Self-pay | Admitting: Internal Medicine

## 2024-05-17 ENCOUNTER — Ambulatory Visit: Admitting: Family Medicine

## 2024-05-17 ENCOUNTER — Ambulatory Visit: Payer: Self-pay

## 2024-05-17 NOTE — Telephone Encounter (Signed)
 Per chart review, pt has appt scheduled

## 2024-05-17 NOTE — Telephone Encounter (Signed)
 Patient states that she has sores in her mouth and is unable to eat. She was prescribed Nystatin  by Dr. Geofm but the situation is not improved. Patient had an appointment this morning with Niki Rung that was canceled due to provider illness. Patient is requesting call back to advise at [phone]

## 2024-05-17 NOTE — Telephone Encounter (Signed)
 Patient has been scheduled

## 2024-05-17 NOTE — Telephone Encounter (Signed)
 Copied from CRM [id number]. Topic: Clinical - Red Word Triage >> May 17, 2024  9:04 AM Joesph NOVAK wrote: Red Word that prompted transfer to Nurse Triage: mouth lesions, pain in mouth and cannot eat. Original appt was cancelled today due to provider out sick  Call unable to be transferred by PAS. Phone call placed to patient-no answer. Voicemail left for patient to call back to Nurse Triage. Will place in call backs.

## 2024-05-18 NOTE — Telephone Encounter (Signed)
 Unable to determine until pt is seen. Will be assessed at visit 12/31

## 2024-05-18 NOTE — Telephone Encounter (Signed)
 Sending to you because the appointment is scheduled with Vickie....  Copied from CRM #[id number]. Topic: General - Call Back - No Documentation >> May 18, 2024 11:31 AM Rea C wrote: Reason for CRM: Patient would like to know when she comes in tomorrow if her mouth can be swabbed tomorrow to determine what the lesions are. She said she's been seen for it already and doesn't want to waste a visit. Patient would appreciate a callback to know.   [phone] (M)

## 2024-05-19 ENCOUNTER — Encounter: Payer: Self-pay | Admitting: Family Medicine

## 2024-05-19 ENCOUNTER — Ambulatory Visit: Admitting: Family Medicine

## 2024-05-19 VITALS — BP 96/64 | HR 72 | Temp 97.6°F | Ht 63.0 in | Wt 119.0 lb

## 2024-05-19 DIAGNOSIS — K117 Disturbances of salivary secretion: Secondary | ICD-10-CM | POA: Diagnosis not present

## 2024-05-19 DIAGNOSIS — K219 Gastro-esophageal reflux disease without esophagitis: Secondary | ICD-10-CM

## 2024-05-19 DIAGNOSIS — K1379 Other lesions of oral mucosa: Secondary | ICD-10-CM

## 2024-05-19 DIAGNOSIS — Z789 Other specified health status: Secondary | ICD-10-CM
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
## 2024-05-20 ENCOUNTER — Encounter: Payer: Self-pay | Admitting: Family Medicine

## 2024-05-21 ENCOUNTER — Other Ambulatory Visit: Payer: Self-pay | Admitting: Family Medicine

## 2024-05-21 DIAGNOSIS — K1370 Unspecified lesions of oral mucosa: Secondary | ICD-10-CM

## 2024-05-21 DIAGNOSIS — E559 Vitamin D deficiency, unspecified: Secondary | ICD-10-CM

## 2024-05-24 ENCOUNTER — Other Ambulatory Visit (INDEPENDENT_AMBULATORY_CARE_PROVIDER_SITE_OTHER)

## 2024-05-24 DIAGNOSIS — K1370 Unspecified lesions of oral mucosa: Secondary | ICD-10-CM | POA: Diagnosis not present

## 2024-05-24 DIAGNOSIS — E559 Vitamin D deficiency, unspecified: Secondary | ICD-10-CM

## 2024-05-24 LAB — FOLATE: Folate: 7.7 ng/mL

## 2024-05-24 LAB — VITAMIN B12: Vitamin B-12: 473 pg/mL (ref 211–911)

## 2024-05-24 LAB — VITAMIN D 25 HYDROXY (VIT D DEFICIENCY, FRACTURES): VITD: 11.37 ng/mL — ABNORMAL LOW (ref 30.00–100.00)

## 2024-05-25 ENCOUNTER — Ambulatory Visit: Payer: Self-pay | Admitting: Family Medicine

## 2024-05-25 ENCOUNTER — Telehealth: Payer: Self-pay | Admitting: Internal Medicine

## 2024-05-25 DIAGNOSIS — K1370 Unspecified lesions of oral mucosa: Secondary | ICD-10-CM

## 2024-05-25 DIAGNOSIS — E559 Vitamin D deficiency, unspecified: Secondary | ICD-10-CM

## 2024-05-25 MED ORDER — VITAMIN D (ERGOCALCIFEROL) 1.25 MG (50000 UNIT) PO CAPS: 50000.0000 [IU] | ORAL_CAPSULE | ORAL | 1 refills | Status: AC

## 2024-05-25 NOTE — Progress Notes (Signed)
 FYI- she should follow up with PCP for any additional labs.  Your vitamin D  level is very low.  I will send in a high-dose once weekly vitamin D  supplement for you to take for the next 3 months.  This could help improve your symptoms.  Your vitamin B12 level is low normal.  I recommend taking an over-the-counter vitamin B12 supplement as well

## 2024-05-25 NOTE — Telephone Encounter (Signed)
 Patient is requesting call back regarding lab results. She is also wanting to know exactly what was ordered. She has seen three labs resulted but states she should have more labs pending. She states that she thinks her iron is low due to the fatigue she is experiencing and also wonders if any other vitamin levels are low. Please call back - [phone]

## 2024-05-25 NOTE — Telephone Encounter (Signed)
 Spoke w pt, see lab result encounter

## 2024-05-26 ENCOUNTER — Ambulatory Visit: Admitting: Internal Medicine

## 2024-06-08 ENCOUNTER — Telehealth: Payer: Self-pay | Admitting: Cardiology

## 2024-06-08 NOTE — Telephone Encounter (Signed)
 Very possible she may be confusing me and Dr Ladona but that's fine with me.  Thanks MJP

## 2024-06-08 NOTE — Telephone Encounter (Signed)
 I do not mind any switch

## 2024-06-08 NOTE — Telephone Encounter (Signed)
 Patient would like to switch providers from Dr. Elmira to Dr. Ladona. Pt states Dr. Ladona was her cardiologist previously and has no recollection of visiting with Dr. Elmira. Please advise.

## 2024-06-09 ENCOUNTER — Encounter: Payer: Self-pay | Admitting: Rheumatology

## 2024-06-10 ENCOUNTER — Encounter: Payer: Self-pay | Admitting: Obstetrics and Gynecology

## 2024-06-10 ENCOUNTER — Other Ambulatory Visit (HOSPITAL_COMMUNITY)
Admission: RE | Admit: 2024-06-10 | Discharge: 2024-06-10 | Disposition: A | Source: Ambulatory Visit | Attending: Obstetrics and Gynecology | Admitting: Obstetrics and Gynecology

## 2024-06-10 ENCOUNTER — Ambulatory Visit: Admitting: Obstetrics and Gynecology

## 2024-06-10 VITALS — BP 108/62 | HR 77 | Ht 62.75 in | Wt 119.0 lb

## 2024-06-10 DIAGNOSIS — Z01419 Encounter for gynecological examination (general) (routine) without abnormal findings: Secondary | ICD-10-CM

## 2024-06-10 DIAGNOSIS — Z1331 Encounter for screening for depression: Secondary | ICD-10-CM | POA: Diagnosis not present

## 2024-06-10 DIAGNOSIS — K120 Recurrent oral aphthae: Secondary | ICD-10-CM

## 2024-06-10 DIAGNOSIS — G9339 Other post infection and related fatigue syndromes: Secondary | ICD-10-CM | POA: Diagnosis not present

## 2024-06-10 DIAGNOSIS — N951 Menopausal and female climacteric states: Secondary | ICD-10-CM

## 2024-06-10 DIAGNOSIS — G4700 Insomnia, unspecified: Secondary | ICD-10-CM

## 2024-06-10 DIAGNOSIS — M8580 Other specified disorders of bone density and structure, unspecified site: Secondary | ICD-10-CM
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
## 2024-06-11 LAB — SURESWAB® ADVANCED VAGINITIS PLUS,TMA
C. trachomatis RNA, TMA: NOT DETECTED
CANDIDA SPECIES: NOT DETECTED
Candida glabrata: NOT DETECTED
N. gonorrhoeae RNA, TMA: NOT DETECTED
SURESWAB(R) ADV BACTERIAL VAGINOSIS(BV),TMA: NEGATIVE
TRICHOMONAS VAGINALIS (TV),TMA: NOT DETECTED

## 2024-06-11 LAB — PTH, INTACT (ICMA) AND IONIZED CALCIUM
Calcium, Ion: 4.9 mg/dL (ref 4.7–5.5)
Calcium: 9 mg/dL (ref 8.6–10.4)
PTH: 53 pg/mL (ref 16–77)

## 2024-06-11 LAB — B12 AND FOLATE PANEL
Folate: 9.3 ng/mL
Vitamin B-12: 530 pg/mL (ref 200–1100)

## 2024-06-11 LAB — C-REACTIVE PROTEIN: CRP: <3 mg/L

## 2024-06-12 ENCOUNTER — Ambulatory Visit: Payer: Self-pay | Admitting: Obstetrics and Gynecology

## 2024-06-14 LAB — LUPUS(12) PANEL
Anti Nuclear Antibody (ANA): NEGATIVE
C3 Complement: 118 mg/dL (ref 83–193)
C4 Complement: 26 mg/dL (ref 15–57)
ENA SM Ab Ser-aCnc: <1 AI
Rheumatoid fact SerPl-aCnc: <10 [IU]/mL
Ribosomal P Protein Ab: <1 AI
SM/RNP: <1 AI
SSA (Ro) (ENA) Antibody, IgG: <1 AI
SSB (La) (ENA) Antibody, IgG: <1 AI
Scleroderma (Scl-70) (ENA) Antibody, IgG: <1 AI
Thyroperoxidase Ab SerPl-aCnc: <1 [IU]/mL
ds DNA Ab: 1 [IU]/mL

## 2024-06-16 ENCOUNTER — Inpatient Hospital Stay
Admission: RE | Admit: 2024-06-16 | Discharge: 2024-06-16 | Disposition: A | Source: Ambulatory Visit | Attending: Acute Care | Admitting: Acute Care

## 2024-06-16 DIAGNOSIS — Z122 Encounter for screening for malignant neoplasm of respiratory organs: Secondary | ICD-10-CM

## 2024-06-16 DIAGNOSIS — Z87891 Personal history of nicotine dependence: Secondary | ICD-10-CM

## 2024-06-16 LAB — CYTOLOGY - PAP: Diagnosis: NEGATIVE

## 2024-06-18 ENCOUNTER — Ambulatory Visit: Payer: Self-pay

## 2024-06-18 LAB — COMPREHENSIVE METABOLIC PANEL WITH GFR
AG Ratio: 2.1 (calc) (ref 1.0–2.5)
ALT: 21 U/L (ref 6–29)
AST: 19 U/L (ref 10–35)
Albumin: 4.6 g/dL (ref 3.6–5.1)
Alkaline phosphatase (APISO): 64 U/L (ref 37–153)
BUN: 12 mg/dL (ref 7–25)
CO2: 29 mmol/L (ref 20–32)
Calcium: 9.4 mg/dL (ref 8.6–10.4)
Chloride: 103 mmol/L (ref 98–110)
Creat: 0.59 mg/dL (ref 0.50–1.03)
Globulin: 2.2 g/dL (ref 1.9–3.7)
Glucose, Bld: 89 mg/dL (ref 65–99)
Potassium: 3.7 mmol/L (ref 3.5–5.3)
Sodium: 140 mmol/L (ref 135–146)
Total Bilirubin: 0.6 mg/dL (ref 0.2–1.2)
Total Protein: 6.8 g/dL (ref 6.1–8.1)
eGFR: 104 mL/min/{1.73_m2}

## 2024-06-18 LAB — CBC
HCT: 42.6 % (ref 35.9–46.0)
Hemoglobin: 13.7 g/dL (ref 11.7–15.5)
MCH: 30 pg (ref 27.0–33.0)
MCHC: 32.2 g/dL (ref 31.6–35.4)
MCV: 93.2 fL (ref 81.4–101.7)
MPV: 11.8 fL (ref 7.5–12.5)
Platelets: 231 10*3/uL (ref 140–400)
RBC: 4.57 Million/uL (ref 3.80–5.10)
RDW: 13.1 % (ref 11.0–15.0)
WBC: 3.8 10*3/uL (ref 3.8–10.8)

## 2024-06-18 LAB — LIPID PANEL
Cholesterol: 227 mg/dL — ABNORMAL HIGH
HDL: 82 mg/dL
LDL Cholesterol (Calc): 126 mg/dL — ABNORMAL HIGH
Non-HDL Cholesterol (Calc): 145 mg/dL — ABNORMAL HIGH
Total CHOL/HDL Ratio: 2.8 (calc)
Triglycerides: 88 mg/dL

## 2024-06-18 LAB — ESTRADIOL: Estradiol: <30 pg/mL

## 2024-06-18 LAB — FOLLICLE STIMULATING HORMONE: FSH: 104.8 m[IU]/mL

## 2024-06-18 LAB — IRON,TIBC AND FERRITIN PANEL
%SAT: 36 % (ref 16–45)
Ferritin: 20 ng/mL (ref 16–232)
Iron: 132 ug/dL (ref 45–160)
TIBC: 370 ug/dL (ref 250–450)

## 2024-06-18 LAB — T4, FREE: Free T4: 1.3 ng/dL (ref 0.8–1.8)

## 2024-06-18 LAB — TSH: TSH: 3.85 m[IU]/L (ref 0.40–4.50)

## 2024-06-18 LAB — VITAMIN D 25 HYDROXY (VIT D DEFICIENCY, FRACTURES): Vit D, 25-Hydroxy: 38 ng/mL (ref 30–100)

## 2024-06-18 LAB — MAGNESIUM: Magnesium: 2.1 mg/dL (ref 1.5–2.5)

## 2024-06-21 ENCOUNTER — Ambulatory Visit: Payer: Self-pay

## 2024-06-21 ENCOUNTER — Other Ambulatory Visit: Payer: Self-pay

## 2024-06-21 ENCOUNTER — Encounter: Admitting: Family Medicine

## 2024-06-21 DIAGNOSIS — Z122 Encounter for screening for malignant neoplasm of respiratory organs: Secondary | ICD-10-CM

## 2024-06-21 DIAGNOSIS — Z87891 Personal history of nicotine dependence: Secondary | ICD-10-CM

## 2024-06-21 LAB — TESTOS,TOTAL,FREE AND SHBG (FEMALE)

## 2024-06-21 NOTE — Telephone Encounter (Signed)
 Patient/caller refused triage.  Reason for refusal: Patient states she did not think it was necessary to discuss her symptoms with nurse triage. She is calling in because her OBGYN recommended [facility] for her to transfer to for primary care. 3 times appointments have been made and issues are occurring or they ar ebeing cancelled. She states she would just like an appointment made. RN advised patient to call back in the future if she would like to discuss with triage RN and to follow up with her OBGYN in the mean time; scheduled her for soonest [facility] appt 08/18/24 .  Message from Imbler E sent at 06/21/2024  8:57 AM EST  Summary: not sleeping at night extremely exhausted not myself   Reason for Triage: not sleeping at night extremely exhausted not myself         Reason for Disposition  Caller requesting an appointment, triage offered and declined  Answer Assessment - Initial Assessment Questions 1. REASON FOR CALL or QUESTION: What is your reason for calling today? or How can I best     Extremely tired, difficulty concentrating or doing daily activity. Don't feel like myself. Issues urinating, have to move myself/position myself to urinate. Wants to see PCP before she turns 59 to discuss hormone replacement therapy. Declined nurse triage and states she doesn't think that is necessary. Scheduled for next available TOC appt with [facility]. Advised her to call back if she would like to discuss symptoms and to follow up with OBGYN until PCP appt.  2. CALLER: Document the source of call. (e.g., laboratory staff, caregiver or patient).     Patient.  Protocols used: PCP Call - No Triage-A-AH

## 2024-06-22 NOTE — Telephone Encounter (Signed)
 [facility]  [address] [place], [address]  What is the diagnosis for referral?   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

## 2024-06-23 ENCOUNTER — Encounter: Payer: Self-pay | Admitting: Cardiology

## 2024-06-23 ENCOUNTER — Ambulatory Visit: Admitting: Cardiology

## 2024-06-23 ENCOUNTER — Encounter: Payer: Self-pay | Admitting: Allergy & Immunology

## 2024-06-23 VITALS — BP 111/69 | HR 63 | Resp 16 | Ht 62.0 in | Wt 119.0 lb

## 2024-06-23 DIAGNOSIS — E7800 Pure hypercholesterolemia, unspecified: Secondary | ICD-10-CM | POA: Diagnosis not present

## 2024-06-23 DIAGNOSIS — I700 Atherosclerosis of aorta: Secondary | ICD-10-CM

## 2024-06-23 NOTE — Patient Instructions (Signed)
 We recommend signing up for the patient portal called MyChart.  Patients are able to view lab/test results, encounter notes, upcoming appointments, etc.  Non-urgent messages can be sent to your provider as well, go to [email or web address].

## 2024-06-23 NOTE — Progress Notes (Unsigned)
" °  Cardiology Office Note:  .   Date:  06/23/2024  ID:  Katherine Cruz, DOB 08-20-1965, MRN [id number] PCP: Joshua Debby CROME, MD  [facility] Providers Cardiologist:  Gordy Bergamo, MD { Click to update primary MD,subspecialty MD or APP then REFRESH:1}  History of Present Illness: Katherine   Annesha Cruz is a 59 y.o. 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  Coronary calcium score on 11/20/2022 revealed a total score of 0 but mild aortic atherosclerosis.  On her prior visit, due to patient preference, she did not want to start aspirin or statins hence it was held in view of coronary calcium score of 0 and patient being nondiabetic and no family history of premature coronary disease.    Discussed the use of AI scribe software for clinical note transcription with the patient, who gave verbal consent to proceed.  History of Present Illness   Cardiac Studies relevent.   Reviewed EKG:      Labs   Lab Results  Component Value Date   CHOL 227 (H) 06/10/2024   HDL 82 06/10/2024   LDLCALC 126 (H) 06/10/2024   LDLDIRECT 133 (H) 12/03/2021   TRIG 88 06/10/2024   CHOLHDL 2.8 06/10/2024   No results found for: LIPOA  Recent Labs    12/09/23 1154 06/10/24 1420 06/10/24 1421  NA 140 140  --   K 4.3 3.7  --   CL 101 103  --   CO2 25 29  --   GLUCOSE 85 89  --   BUN 16 12  --   CREATININE 0.67 0.59  --   CALCIUM 9.2 9.4 9.0    Lab Results  Component Value Date   ALT 21 06/10/2024   AST 19 06/10/2024   ALKPHOS 69 12/09/2023   BILITOT 0.6 06/10/2024      Latest Ref Rng & Units 06/10/2024    2:20 PM 05/20/2023    1:43 PM 09/05/2022    1:45 PM  CBC  WBC 3.8 - 10.8 Thousand/uL 3.8  4.4  4.5   Hemoglobin 11.7 - 15.5 g/dL 86.2  85.8  86.2   Hematocrit 35.9 - 46.0 % 42.6  41.8  40.6   Platelets 140 - 400 Thousand/uL  231  191.0  210.0    No results found for: HGBA1C  Lab Results  Component Value Date   TSH 3.85 06/10/2024     ROS  ***ROS Physical Exam:   VS:  LMP 03/24/2015    Wt Readings from Last 3 Encounters:  06/10/24 119 lb (54 kg)  05/19/24 119 lb (54 kg)  04/13/24 118 lb 9.6 oz (53.8 kg)    BP Readings from Last 3 Encounters:  06/10/24 108/62  05/19/24 96/64  04/14/24 98/72   ***Physical Exam  ASSESSMENT AND PLAN: .      ICD-10-CM   1. Mild hypercholesterolemia  E78.00     2. Aortic atherosclerosis  I70.0      Assessment & Plan   Follow up: ***  Signed,  Gordy Bergamo, MD, [facility] 06/23/2024, 11:03 AM [facility] [address] [place], [place] [address] Phone: [phone]. Fax:  [phone]  "

## 2024-06-24 NOTE — Progress Notes (Signed)
 This encounter was created in error - please disregard.

## 2024-06-24 NOTE — Telephone Encounter (Signed)
 Referral updated with provided diagnosis codes.   Patient notified by MyChart  Routing to [facility] for f/u of referral.

## 2024-06-26 ENCOUNTER — Other Ambulatory Visit (HOSPITAL_BASED_OUTPATIENT_CLINIC_OR_DEPARTMENT_OTHER)

## 2024-06-28 ENCOUNTER — Encounter: Admitting: Family Medicine

## 2024-08-12 ENCOUNTER — Ambulatory Visit

## 2024-08-18 ENCOUNTER — Encounter: Payer: Self-pay | Admitting: Family Medicine

## 2024-10-12 ENCOUNTER — Encounter: Admitting: Family Medicine
